# Patient Record
Sex: Male | Born: 1935 | Race: White | Hispanic: No | Marital: Married | Smoking: Former smoker
Health system: Southern US, Community
[De-identification: ages and names within clinical notes are randomized; demographics above are authoritative.]

## PROBLEM LIST (undated history)

## (undated) DIAGNOSIS — I701 Atherosclerosis of renal artery: Secondary | ICD-10-CM

## (undated) DIAGNOSIS — I714 Abdominal aortic aneurysm, without rupture, unspecified: Secondary | ICD-10-CM

## (undated) DIAGNOSIS — I499 Cardiac arrhythmia, unspecified: Secondary | ICD-10-CM

## (undated) DIAGNOSIS — M1990 Unspecified osteoarthritis, unspecified site: Secondary | ICD-10-CM

## (undated) DIAGNOSIS — J449 Chronic obstructive pulmonary disease, unspecified: Secondary | ICD-10-CM

## (undated) DIAGNOSIS — I10 Essential (primary) hypertension: Secondary | ICD-10-CM

## (undated) DIAGNOSIS — I2510 Atherosclerotic heart disease of native coronary artery without angina pectoris: Secondary | ICD-10-CM

## (undated) DIAGNOSIS — I639 Cerebral infarction, unspecified: Secondary | ICD-10-CM

## (undated) DIAGNOSIS — I4891 Unspecified atrial fibrillation: Secondary | ICD-10-CM

## (undated) DIAGNOSIS — E785 Hyperlipidemia, unspecified: Secondary | ICD-10-CM

## (undated) HISTORY — DX: Atherosclerosis of renal artery: I70.1

## (undated) HISTORY — DX: Cerebral infarction, unspecified: I63.9

## (undated) HISTORY — DX: Atherosclerotic heart disease of native coronary artery without angina pectoris: I25.10

## (undated) HISTORY — DX: Unspecified atrial fibrillation: I48.91

## (undated) HISTORY — PX: CATARACT EXTRACTION: SUR2

## (undated) HISTORY — DX: Abdominal aortic aneurysm, without rupture: I71.4

## (undated) HISTORY — PX: OTHER SURGICAL HISTORY: SHX169

## (undated) HISTORY — PX: MEDIAN STERNOTOMY: SUR860

## (undated) HISTORY — DX: Essential (primary) hypertension: I10

## (undated) HISTORY — DX: Chronic obstructive pulmonary disease, unspecified: J44.9

## (undated) HISTORY — DX: Cardiac arrhythmia, unspecified: I49.9

## (undated) HISTORY — PX: COLON SURGERY: SHX602

## (undated) HISTORY — PX: EYE SURGERY: SHX253

## (undated) HISTORY — DX: Abdominal aortic aneurysm, without rupture, unspecified: I71.40

## (undated) HISTORY — DX: Hyperlipidemia, unspecified: E78.5

---

## 1990-05-20 HISTORY — PX: SKIN GRAFT: SHX250

## 1995-05-21 HISTORY — PX: HAND SURGERY: SHX662

## 1996-05-20 HISTORY — PX: LAPAROTOMY: SHX154

## 2001-12-29 ENCOUNTER — Ambulatory Visit (HOSPITAL_COMMUNITY): Admission: RE | Admit: 2001-12-29 | Discharge: 2001-12-29 | Payer: Self-pay | Admitting: *Deleted

## 2005-02-12 ENCOUNTER — Encounter: Admission: RE | Admit: 2005-02-12 | Discharge: 2005-02-12 | Payer: Self-pay

## 2005-08-22 ENCOUNTER — Ambulatory Visit (HOSPITAL_COMMUNITY): Admission: RE | Admit: 2005-08-22 | Discharge: 2005-08-22 | Payer: Self-pay | Admitting: Vascular Surgery

## 2005-08-26 ENCOUNTER — Encounter: Payer: Self-pay | Admitting: Vascular Surgery

## 2005-08-26 ENCOUNTER — Ambulatory Visit (HOSPITAL_COMMUNITY): Admission: RE | Admit: 2005-08-26 | Discharge: 2005-08-26 | Payer: Self-pay | Admitting: Cardiovascular Disease

## 2005-08-26 HISTORY — PX: CARDIAC CATHETERIZATION: SHX172

## 2005-08-28 ENCOUNTER — Encounter: Admission: RE | Admit: 2005-08-28 | Discharge: 2005-08-28 | Payer: Self-pay | Admitting: Vascular Surgery

## 2005-09-02 ENCOUNTER — Inpatient Hospital Stay (HOSPITAL_COMMUNITY): Admission: RE | Admit: 2005-09-02 | Discharge: 2005-09-07 | Payer: Self-pay | Admitting: Surgery

## 2005-09-02 HISTORY — PX: CORONARY ARTERY BYPASS GRAFT: SHX141

## 2005-09-16 ENCOUNTER — Encounter: Admission: RE | Admit: 2005-09-16 | Discharge: 2005-10-01 | Payer: Self-pay | Admitting: Surgery

## 2005-09-26 ENCOUNTER — Encounter (HOSPITAL_COMMUNITY): Admission: RE | Admit: 2005-09-26 | Discharge: 2005-12-25 | Payer: Self-pay | Admitting: Cardiovascular Disease

## 2005-12-23 ENCOUNTER — Inpatient Hospital Stay (HOSPITAL_COMMUNITY): Admission: RE | Admit: 2005-12-23 | Discharge: 2005-12-25 | Payer: Self-pay | Admitting: Vascular Surgery

## 2005-12-23 HISTORY — PX: PR VEIN BYPASS GRAFT,AORTO-FEM-POP: 35551

## 2006-01-06 ENCOUNTER — Encounter: Admission: RE | Admit: 2006-01-06 | Discharge: 2006-01-16 | Payer: Self-pay | Admitting: Surgery

## 2006-01-21 ENCOUNTER — Encounter: Admission: RE | Admit: 2006-01-21 | Discharge: 2006-01-21 | Payer: Self-pay | Admitting: Vascular Surgery

## 2006-07-15 ENCOUNTER — Encounter: Admission: RE | Admit: 2006-07-15 | Discharge: 2006-07-15 | Payer: Self-pay | Admitting: Vascular Surgery

## 2006-07-15 ENCOUNTER — Ambulatory Visit: Payer: Self-pay | Admitting: Vascular Surgery

## 2007-01-06 ENCOUNTER — Ambulatory Visit: Payer: Self-pay | Admitting: Vascular Surgery

## 2007-01-06 ENCOUNTER — Encounter: Admission: RE | Admit: 2007-01-06 | Discharge: 2007-01-06 | Payer: Self-pay | Admitting: Vascular Surgery

## 2007-12-28 ENCOUNTER — Ambulatory Visit: Payer: Self-pay | Admitting: Vascular Surgery

## 2007-12-28 ENCOUNTER — Encounter: Admission: RE | Admit: 2007-12-28 | Discharge: 2007-12-28 | Payer: Self-pay | Admitting: Vascular Surgery

## 2009-01-10 ENCOUNTER — Encounter: Admission: RE | Admit: 2009-01-10 | Discharge: 2009-01-10 | Payer: Self-pay | Admitting: Vascular Surgery

## 2009-01-10 ENCOUNTER — Ambulatory Visit: Payer: Self-pay | Admitting: Vascular Surgery

## 2010-02-06 ENCOUNTER — Ambulatory Visit: Payer: Self-pay | Admitting: Vascular Surgery

## 2010-02-28 ENCOUNTER — Ambulatory Visit: Payer: Self-pay | Admitting: Cardiovascular Disease

## 2010-10-02 NOTE — Assessment & Plan Note (Signed)
OFFICE VISIT   Gabriel Gutierrez, Gabriel Gutierrez  DOB:  09-24-1935                                       01/10/2009  CHART#:[id number]   Mr. Armato returns today for continued followup regarding his aortic  stent graft which was inserted for an infrarenal abdominal aortic  aneurysm in August 2007.  It is a [employer] aortic stent graft.  He  has had no abdominal or back symptoms since I last saw him 1 year ago.  He denies any chest pain, dyspnea on exertion, PND, orthopnea, or  claudication symptoms.  He is taking 1 aspirin per day.   On physical exam, blood pressure 144/73, heart rate 56, respirations 14.  Carotid pulses 3+, no audible bruits.  NEUROLOGIC:  Normal.  CHEST:  Clear to auscultation.  ABDOMEN:  Soft, nontender with no palpable  masses.  He has 3+ femoral popliteal and posterior tibial pulses  palpable bilaterally.   CT angiogram was performed today which I reviewed and reveals the stent  graft to be in excellent position with no evidence of endoleak or  migration of the graft.  The aneurysm sac measures only 2.6 x 3.2 cm  around the graft.   I am very pleased with his result.  We will see him in 1 year.  We will  obtain a duplex scan in the office for continued followup of his stent  graft.   Quita Skye Hart Rochester, M.D.  Electronically Signed   JDL/MEDQ  D:  01/10/2009  T:  01/11/2009  Job:  [id number]

## 2010-10-02 NOTE — Procedures (Signed)
DUPLEX ULTRASOUND OF ABDOMINAL AORTA   INDICATION:  Follow up endostent repair of AAA.   HISTORY:  Diabetes:  Yes.  Cardiac:  No.  Hypertension:  No.  Smoking:  Previous.  Connective Tissue Disorder:  Family History:  No.  Previous Surgery:  Stent graft repair of abdominal aortic aneurysm and  right common iliac artery PTA in 2007.   DUPLEX EXAM:         AP (cm)                   TRANSVERSE (cm)  Proximal             2.4 cm                    2.8 cm  Mid                  Not visualized            Not visualized  Distal               2.9 cm                    3.1 cm  Right Iliac          Not visualized            Not visualized  Left Iliac           Not visualized            Not visualized   PREVIOUS:  Date: 01/10/2009 (CT)  AP:  2.6  TRANSVERSE:  3.2   IMPRESSION:  1. Patent abdominal aortic aneurysm stent with no evidence of endoleak      or stenosis, based on limited visualization.  2. No significant change in the abdominal aortic aneurysm sac size      when compared to the previous examination.  3. Unable to adequately visualize the mid aorta and bilateral common      iliac arteries due to overlying bowel gas patterns.  The patient      was not n.p.o. for this examination.        ___________________________________________  Quita Skye Hart Rochester, M.D.   CH/MEDQ  D:  02/06/2010  T:  02/06/2010  Job:  [id number]

## 2010-10-02 NOTE — Assessment & Plan Note (Signed)
OFFICE VISIT   Gabriel Gutierrez, Gabriel Gutierrez  DOB:  1935-11-21                                       02/06/2010  CHART#:[id number]   The patient returns today for his annual followup regarding his  infrarenal abdominal aortic aneurysm which was treated with a Cook  Zenith stent graft in 2007.  He has had no abdominal or back symptoms  since I last saw him 1 year ago.  He also has had no chest pain, dyspnea  on exertion, PND, orthopnea, claudication, GI or GU-type symptoms.  He  remains quite active, easily walking a mile at a time.   CHRONIC MEDICAL PROBLEMS:  1. Type 2 diabetes mellitus.  2. Hyperlipidemia.  3. Coronary artery disease, previous coronary bypass grafting in 2007.  4. History of colon rupture.  5. Negative for stroke, COPD or hypertension.   SOCIAL HISTORY:  He is retired and has 3 children.  Does not use tobacco  or alcohol.   REVIEW OF SYSTEMS:  Negative for chest pain, dyspnea on exertion.  Has  occasional wheezing, arthritis and decreased hearing and urinary  frequency.  All other systems negative in review of systems.   PHYSICAL EXAMINATION:  Blood pressure 166/68, heart rate 64, temperature  98, respirations 14.  General:  A well-developed, well-nourished male in  no apparent distress.  HEENT:  Normal for age.  Lungs:  Clear to  auscultation.  No rhonchi or wheezing.  Cardiovascular:  Regular rhythm,  no murmurs.  Abdomen:  Soft, nontender, no pulsatile mass noted.  He has  3+ femoral, popliteal and posterior tibial pulses palpable bilaterally  with no distal edema.   Today I ordered a duplex scan to visualize his aorta and the graft.  There is no evidence of any endoleak.  The aneurysm sac continues to  contract, having a maximum diameter of 3.1 cm.   I reassured him regarding these findings.  He will return in 1 year with  a duplex scan of his aorta to again check the stent graft for problems.     Quita Skye Hart Rochester, M.D.  Electronically  Signed   JDL/MEDQ  D:  02/06/2010  T:  02/07/2010  Job:  [id number]

## 2010-10-02 NOTE — Assessment & Plan Note (Signed)
OFFICE VISIT   Gabriel Gutierrez, Gabriel Gutierrez  DOB:  09-24-1935                                       12/28/2007  CHART#:[id number]   The patient returns for his annual follow-up regarding his aortic  aneurysm repair with a stent graft (Zenith) to both common iliac  arteries which was done in August 2007.  He has had no abdominal  symptoms.  He does have chronic back symptoms on occasion but nothing  out of the ordinary.  He has also had no chest pain, dyspnea on  exertion, PND, orthopnea hemiparesis, aphasia, amaurosis fugax,  diplopia, blurred vision, syncope or claudication type symptoms.  He  takes aspirin 81 mg once daily.   PHYSICAL EXAM:  Blood pressure 152/70, heart rate is 55, respirations  are 12.  Carotid pulses 3+, no audible bruits.  Neurologic:  Normal.  No  palpable adenopathy in the neck.  Chest:  Clear to auscultation.  Cardiovascular:  Regular rhythm no murmurs.  Abdomen:  Soft, nontender  with no palpable masses.  He has 3+ femoral, 2+ popliteal and 2+  posterior tibial pulses palpable bilaterally.  Both feet are well-  perfused.   I reviewed the CT angiogram today and there is no evidence of any  endoleak, migration of the graft, fracture of the graft or other  abnormalities and it remains in excellent position.  Aneurysm has  contracted slightly around graft today.   I have reassured him regarding these findings, I am seeing him back in 1  year for continued follow-up CT angiogram on an annual basis.   Quita Skye Hart Rochester, M.D.  Electronically Signed   JDL/MEDQ  D:  12/28/2007  T:  12/29/2007  Job:  [id number]   cc:   Brent Bulla, MD

## 2010-10-02 NOTE — Assessment & Plan Note (Signed)
OFFICE VISIT   KAIDON, KINKER  DOB:  [birth year]                                       01/06/2007  CHART#:[id number]   The patient returns today one year post repair of an abdominal aortic  aneurysm using an aortic stent graft (Zenith) to both common iliac  arteries. He has done quite well with no complications or complaints. He  has returned to his normal activity level. A CT angiogram was performed  today, which I have reviewed and reveals no evidence of any migration of  the graft, endoleak or fracture of the stents. He denies any chest pain,  dyspnea on exertion, PND or orthopnea and is able to ambulate long  distances doing a fast walk each morning for between one-half and one  mile without calf symptoms. He takes one aspirin per day.   PHYSICAL EXAMINATION:  Blood pressure is 118/48, heart rate 58 and  regular, respirations are 16. His carotid pulses are 3+ and no audible  bruits. NEUROLOGIC: Examination is normal. CHEST: Clear to auscultation.  ABDOMEN: Soft and nontender with no pulsatile mass noted. He has 3+  femoral and 2+ posterior tibial pulses palpable bilaterally.   I think he is getting along quite well and will continue to follow him  on a regular basis for any problems that could arise from the stent  graft with a CT angiogram to be performed in 12 months prior to his  return.   Quita Skye Hart Rochester, M.D.  Electronically Signed   JDL/MEDQ  D:  01/06/2007  T:  01/07/2007  Job:  [id number]   cc:   Soyla Murphy. Renne Crigler, M.D.

## 2010-10-05 NOTE — Op Note (Signed)
NAME:  Gabriel Gutierrez, Gabriel Gutierrez                 ACCOUNT NO.:  [id number]   MEDICAL RECORD NO.:  [id number]          PATIENT TYPE:  AMB   LOCATION:  SDS                          FACILITY:  [facility]   PHYSICIAN:  Quita Skye. Hart [place], M.D.  DATE OF BIRTH:  May 22, 1935   DATE OF PROCEDURE:  08/22/2005  DATE OF DISCHARGE:  08/22/2005                                 OPERATIVE REPORT   PREOP DIAGNOSIS:  Infrarenal abdominal aortic aneurysm.   POSTOP DIAGNOSIS:  Infrarenal abdominal aortic aneurysm.   PROCEDURE:  1.  Biplane abdominal aortogram with bilateral lower extremity runoff.  2.  Bilateral iliac angiography.   SURGEON:  Dr. Hart Rochester.   ANESTHESIA:  Local Xylocaine.   CONTRAST:  180 mL.   COMPLICATIONS:  None.   DESCRIPTION OF PROCEDURE:  Patient was taken to [facility] peripheral  endovascular lab, placed in the supine position at which time the right  groin was prepped with Betadine solution and draped in routine sterile  manner.  After infiltration of 1% Xylocaine, right common femoral artery was  entered percutaneously.  Guidewire passed into the suprarenal aorta under  fluoroscopic guidance.  A 5-French sheath and dilator were passed over the  guide wire, dilator removed and a standard graduated pigtail catheter  positioned in the suprarenal aorta.  Flush abdominal aortogram was performed  injecting 30 mL of contrast at 20 mL per second.  This revealed the aorta to  be widely patent with single renal arteries bilaterally with the left  slightly lower than the right.  There was an infrarenal saccular abdominal  aortic aneurysm measuring approximately 5 cm in diameter by the calcium rim  measurements and it terminated at the aortic bifurcation.  Inferior  mesenteric artery was patent.  There was a mild stenosis at the origin of  both common iliac arteries, but the common iliac arteries otherwise were  widely patent and the internal and external iliac arteries were patent as  well.  Lateral  aortogram was performed injecting 20 mL of contrast at 20 mL  per second.  This revealed the superior mesenteric artery to be widely  patent and the aneurysm neck to be very slightly deviated anteriorly.  The  catheter was withdrawn into the terminal aorta, bilateral oblique iliac  angiography views were obtained in both RAO and LAO positions revealing the  same findings in the iliac system with widely patent internal iliac  arteries.  The bilateral lower extremity runoff was then performed injecting  88 mL of contrast at 8 mL per second.  This revealed both common,  superficial, profunda femoris and popliteal arteries to be widely patent.  There was three-vessel runoff in both legs with no significant occlusive  lesions noted.  The pigtail catheter was then removed over the guidewire,  the sheath removed, adequate compression applied.  No complications ensued.   FINDINGS:  1.  Infrarenal abdominal aortic aneurysm.  2.  Single widely patent renal arteries bilaterally.  3.  Mild proximal iliac occlusive disease bilaterally with otherwise widely      patent iliac system and normal distal runoff.  ______________________________  Quita Skye Hart Rochester, M.D.     JDL/MEDQ  D:  08/22/2005  T:  08/23/2005  Job:  [id number]

## 2010-10-05 NOTE — Op Note (Signed)
   NAME:  Gabriel Gutierrez, Gabriel Gutierrez                         ACCOUNT NO.:  [id number]   MEDICAL RECORD NO.:  [id number]                   PATIENT TYPE:  AMB   LOCATION:  ENDO                                 FACILITY:  [facility]   PHYSICIAN:  Georgiana Spinner, M.D.                 DATE OF BIRTH:  [birth year]   DATE OF PROCEDURE:  12/29/2001  DATE OF DISCHARGE:                                 OPERATIVE REPORT   PROCEDURE:  Colonoscopy.   INDICATIONS:  Colon cancer screening. Hemoccult positivity.   ANESTHESIA:  Demerol 50, Versed 7.5 mg.   DESCRIPTION OF PROCEDURE:  With patient mildly sedated in the left lateral  decubitus position, the Olympus videoscopic colonoscope was inserted in the  rectum after a normal rectal exam and passed under direct vision to the  cecum, identified by the ileocecal valve and appendiceal orifice, both of  which were photographed.  Of note, as we entered, it almost appeared as if  he had a blind pouch, and this may have been where the anastomosis occurred.  But from this point, the colonoscope was then slowly withdrawn, taking  circumferential views of the entire colonic mucosa, stopping in the rectum  only which appeared normal on direct and showed small hemorrhoids on  retroflexed view.  The endoscope was straightened and withdrawn.  The  patient's vital signs and pulse oximeter remained stable.  The patient  tolerated the procedure well without apparent complications.   FINDINGS:  Status post colonic resection with internal hemorrhoids.  Otherwise unremarkable colonoscopic examination.   PLAN:  Repeat examination possibly in five years.                                               Georgiana Spinner, M.D.    GMO/MEDQ  D:  12/29/2001  T:  12/30/2001  Job:  [id number]

## 2010-10-05 NOTE — Op Note (Signed)
Gabriel Gutierrez, Gabriel Gutierrez                 ACCOUNT NO.:  [id number]   MEDICAL RECORD NO.:  [id number]          PATIENT TYPE:  INP   LOCATION:  2550                         FACILITY:  [facility]   PHYSICIAN:  Quita Skye. Hart Rochester, M.D.  DATE OF BIRTH:  [birth year]   DATE OF PROCEDURE:  12/23/2005  DATE OF DISCHARGE:  12/11/2005                                 OPERATIVE REPORT   PREOPERATIVE DIAGNOSIS:  Infrarenal abdominal aortic aneurysm.   POSTOPERATIVE DIAGNOSIS:  Infrarenal abdominal aortic aneurysm, right common  iliac occlusive disease.   OPERATION:  1.  Bilateral common femoral artery cut down, right side by Dr. Hart Rochester, left      side by Dr. Darrick Penna.  He will dictate that portion.  2.  Insertion of a Cook-Zenith aortic stent graft using:      1.  A 24 x 82 main body.      2.  A 14 x 88 left leg, contralateral limb.      3.  A 14 x 71 right leg, ipsilateral limb with completion angiogram.  3.  PTA of right common iliac artery stenosis using an 8 x 3 Power Flex      angioplasty catheter inflated at 10 atmospheres for 45 seconds.   SURGEON:  Devean Skoczylas. Hart Rochester, M.D.  Janetta Hora. Darrick Penna, M.D.   ANESTHESIA:  General endotracheal.   COMPLICATIONS:  None.   DESCRIPTION OF PROCEDURE:  The patient was taken to the operating room and  placed in supine position at which time satisfactory general endotracheal  anesthesia was administered.  Abdomen and both groins were prepped with  Betadine scrub and solution and draped in routine sterile manner.  Oblique  inguinal incisions were made bilaterally and carried down through  subcutaneous tissues and common superficial and profundus femoral arteries  dissected free, encircled with vessel loops.  Then, 5000 units of heparin  were given intravenously.  Both common femoral arteries were punctured,  guide wires passed into the suprarenal aorta under fluoroscopic guidance and  the sheaths were inserted over the guide wires.  Dilators removed and a  standard  pigtail catheter passed into the suprarenal aorta.  Flush abdominal  aortogram was preformed, injecting 20 cc of contrast at 20 cc per second.  This revealed the aorta to have single renal arteries bilaterally with an  adequate 2.5 to 3-cm neck below the renal arteries, proximal to the saccular  aneurysm.  There was some occlusive disease in the right common iliac artery  which made initial passage of the guide wire somewhat difficult, although it  was performed with a glide wire with no significant difficulty.  The heparin  having been administered, the main body was deployed through the right  common femoral artery with a 24-mm x 82-mm Cook Zenith main body graft  having been inserted.  This was deployed just distal to the renal arteries  with a suprarenal stent.  It was deployed down to the bifurcation.  At this  point, the contralateral gate was cannulated which also required some  manipulation with a variety of catheters, eventually being successful with  a  SOS catheter.  After it was cannulated, the left-contralateral limb was  deployed utilizing a 14-mm x 88-mm limb.  Prior to deployment of this limb,  a left leg angiogram was performed through the sheath to localize the exact  location of the internal iliac artery to be certain that the limb was  deployed proximal to this point.  Following this, the remainder of the mean  body was deployed and the right limb was deployed in a similar fashion  utilizing 14-mm x 71-mm graft, again performing an angiogram through the  sheath to localize the hypogastric prior to deployment.  Following  completion of deployment of both limbs, a soft dilatation catheter was used  to tack down the limbs at all junction spots.  Then, a completion angiogram  was performed.  There was no evidence of any endo leaks and the graft was in  excellent position.  There was some moderate narrowing which was persistent  in the right common iliac artery, necessitating  balloon angioplasty of this  artery using an 8-mm x 3-cm Power Flex catheter inflated at 10 atmospheres  for 45 seconds.  Cmopletion angiogram after this revealed excellent  technical result.  The heparin was not reversed.  Adequate hemostasis was  achieved after removing the sheaths and both common femoral arteries were  repaired with continuous 6-0 Prolene sutures with excellent posterior tibial  pulses following this.  Following adequate hemostasis, the wounds were  closed in layers with 5-0 chromic in subcuticular fashion and Steri-Strips.  Sterile dressing applied.  The patient was taken to the recovery room in  satisfactory condition.           ______________________________  Quita Skye Hart Rochester, M.D.     JDL/MEDQ  D:  12/23/2005  T:  12/23/2005  Job:  [id number]

## 2010-10-05 NOTE — Discharge Summary (Addendum)
NAMEFRANCISCO, OSTROVSKY NO.:  [id number]   MEDICAL RECORD NO.:  [id number]          PATIENT TYPE:  INP   LOCATION:  2005                         FACILITY:  [facility]   PHYSICIAN:  Quita Skye. Hart Rochester, M.D.  DATE OF BIRTH:  Jul 06, 1935   DATE OF ADMISSION:  DATE OF DISCHARGE:                                 DISCHARGE SUMMARY   ADMISSION DIAGNOSIS:  Abdominal aortic aneurysm.   DISCHARGE DIAGNOSES:  1.  Abdominal aortic aneurysm status post stent graft.  2.  Coronary artery disease status post coronary artery bypass grafting.  3.  Hypertension.  4.  Hyperlipidemia.  5.  History of colostomy for perforated diverticulitis with colon resection      in 1993.  6.  Small bowel obstruction secondary to adhesions in August 1998.   CONSULTS:  None.   PROCEDURES:  On December 24, 2005 the patient underwent:  1.  Bilateral common femoral artery cut down, right side by Dr. Durward Fortes and      left side by Dr. Darrick Penna.  2.  Insertion of a Zenith aortic stent graft using a 24 x 82 main body, a 14      x 44 left leg, contralateral limb, a 14 x 71 right ipsilateral limb with      completion angiogram, PTA of the right common iliac artery stenosis      using an 8 x 3 power flexing ____ QA MARKER: 140 ____ cathe      inflated to 10 atmospheres for 45 seconds by Dr. Josephina Gip.   HISTORY AND PHYSICAL:  The 74 year old Caucasian male with a past medical  history of coronary artery disease status post CABG, and abdominal aortic  aneurysm.  Patient was being evaluated for an aortic stent graft, at which  time he was found to have severe coronary artery disease.  Patient underwent  CABG by per Dr. ____ QA MARKER: 186 ____  2007  well postoperatively.  The patient now presents for repair of his abdominal  aortic aneurysm.  The patient has a CT scan with an aortogram, which showed  that he was an excellent candidate for an aortic stent graft.  Patient  remains asymptomatic.  He denies any  abdominal pain, nausea, vomiting, back  pain, hematuria, reflux symptoms, chest pain, shortness of breath, TIA or  CVA symptoms.   HOSPITAL COURSE:  Postoperatively, the patient has progressed as expected.  Patient's vital signs have remained stable.  Patient was started on a clear  liquid diet on December 23, 2005.  The patient has progressed to a solid heart  healthy diet on postop day number one, December 24, 2005.  The patient has  started his p.o. medication on postop day number 1.   The patient has remained hemodynamically stable.  His IV fluids and Foley  will be discontinued on postop day number 1.  The patient is to ambulate,  and he is to use his incentive spirometry.   PHYSICAL EXAM:  VITAL SIGNS:  Temperature 98.6, blood pressure 118/40, heart  rate 72, respirations 20, input and output plus 20.  CARDIOVASCULAR:  Regular rate and rhythm.  RESPIRATORY:  Clear to auscultation bilaterally.  ABDOMEN:  Bowel sounds positive, soft, nontender, nondistended.  EXTREMITIES:  Warm and well perfused.  INCISION:  Clean, dry and intact.   LABS:  White blood cell count of 9.2, hemoglobin 11.3, hematocrit of 33.8  and a platelet count of 189,000.  BMP showed a sodium of 135, potassium 3.8,  chloride 104, CO2 25, BUN 7, creatinine 1.09, glucose 144.   Patient will be discharged home tomorrow provided he remains in stable  condition.   MEDICATIONS:  1.  Tylox 1-2 tabs every 4-6 hours p.r.n.  2.  Aspirin 325 mg p.o. daily.  3.  Metoprolol 25 mg p.o. daily.  4.  Iron over the counter p.o. daily.  5.  Vytorin 10/40 mg p.o. daily.   INSTRUCTIONS:  Patient instructed to follow a low-fat, low-salt, low-sugar.  Patient to do no driving or heavy lifting greater than 10 pounds for 3  weeks.  The patient is to ambulate 3-4 daily, and increase activity as  tolerated.  The patient may shower and clean his incision with mild soap and  water.  The patient is instructed to call the office with any  incision  problems such as drainage, erythema, temp greater than 101.5.   FOLLOWUP:  Patient will have a follow-up appointment with Dr. Hart Rochester in  January 21, 2006 at 10:30 a.m.  The patient will have a postop stent graft  protocol CT angiogram before his appointment with Dr. Hart Rochester, the office  will send him the information on the time and date of the appointment.      Constance Holster, PA    ______________________________  Quita Skye Hart Rochester, M.D.    JMW/MEDQ  D:  12/24/2005  T:  12/24/2005  Job:  [id number]   cc:   Quita Skye. Hart Rochester, M.D.

## 2010-10-05 NOTE — Op Note (Signed)
NAMELINKYN, GOBIN NO.:  [id number]   MEDICAL RECORD NO.:  [id number]          PATIENT TYPE:  INP   LOCATION:  2003                         FACILITY:  [facility]   PHYSICIAN:  Evelene Croon, M.D.     DATE OF BIRTH:  06-23-1935   DATE OF PROCEDURE:  09/02/2005  DATE OF DISCHARGE:                                 OPERATIVE REPORT   PREOPERATIVE AND POSTOPERATIVE DIAGNOSIS:  Severe three-vessel coronary  disease.   OPERATIVE PROCEDURE:  Median sternotomy, extracorporeal circulation,  coronary bypass graft surgery x5 using a left internal mammary artery graft  to the left anterior descending coronary artery, with a saphenous vein graft  to the diagonal branch of the LAD, a sequential saphenous vein graft to the  second and fourth obtuse marginal branches of the left circumflex coronary  artery, and a saphenous vein graft to the posterior descending branch of the  right coronary.  Endoscopic vein harvesting from the left leg.   ATTENDING SURGEON:  Evelene Croon, M.D.   ASSISTANT:  Constance Holster, PA-C   ANESTHESIA:  General endotracheal.   CLINICAL HISTORY:  This patient is a 75 year old patient followed by Dr.  Merri Brunette who was found to have a 4.85 cm infrarenal abdominal aortic  aneurysm by ultrasound.  He is being considered for aortic endovascular  stent grafting.  Preoperative cardiac workup included a stress Cardiolite  that showed inferior lateral ischemia.  Cardiac catheterization on August 26, 2005 shows severe 3-vessel disease.  The coronaries were heavily calcified.  The left main had about 50% ostial stenosis and was heavily calcified.  The  LAD had proximal 50-60% stenosis followed by a mid vessel 80% stenosis.  This 80% stenosis originated at the takeoff of the second diagonal branch  and was very hazy.  The second diagonal itself had about 70-80% stenosis.  The left circumflex was occluded proximally with the second and third  marginal vessels  filling by collaterals from the LAD.  The right coronary  artery had 60% proximal stenosis followed by 60-70% mid vessel stenosis.  There was also about 99% distal stenosis beyond the bifurcation.  Left  ventricular ejection fraction was about 65-70%.  There was no mitral  regurgitation.  After review of the angiogram and examination of the  patient; it was felt that coronary artery bypass graft surgery was the best  treatment to prevent further ischemia and infarction.  I discussed the  operative procedure with the patient and his wife including alternatives,  benefits, and risks including bleeding, blood transfusion, infection,  stroke, myocardial infarction, graft failure, and death.  He understood and  agreed to proceed.   OPERATIVE PROCEDURE:  The patient was taken to the operating room and placed  on the table in the supine position.  After the induction of general  endotracheal anesthesia a Foley catheter was placed in the bladder using a  sterile technique.  Then the chest, abdomen, and both lower extremities were  prepped and draped in the usual sterile manner.  The chest was entered  through a median sternotomy incision,  and the pericardium opened in the  midline.  Examination of the heart showed good ventricular contractility.  The ascending aorta was somewhat thickened, but had no palpable plaque in  it.   Then the left internal mammary artery was harvested from the chest wall as a  pedicle graft.  This was a medium caliber vessel with excellent blood flow  through it.  At the same time a segment of greater saphenous vein was  harvested from the left leg using endoscopic vein harvest technique.  This  vein was a medium size and good quality.  We initially examined the  saphenous vein adjacent to the right knee, but this vein was small and not  felt to be suitable for bypass grafting.  It was not harvested.   Then the patient was heparinized; and when an adequate clotting  time was  achieved, the distal ascending aorta was cannulated using a 20-French aortic  cannula for arterial inflow.  Venous outflow was achieved using a 2-stage  venous cannula through the right atrial appendage.  An antegrade  cardioplegia and vent cannula was inserted int the aortic root.   The patient was placed on cardiopulmonary bypass and the distal coronary was  identified.  The LAD was a large graftable vessel.  The diagonal branch was  a medium size graftable vessel that was heavily diseased proximally.  The  remainder of the diagonal branches were small vessels.  The left circumflex  gave off a small first marginal that was lying deep in the fat.  It was not  felt to be graftable.  The second marginal was also very small but felt to  be graftable.  The third marginal was tiny and the fourth marginal was a  moderate size vessel that was graftable but diffusely diseased.  The right  coronary artery gave off a moderate size posterior descending branch and a  tiny posterolateral branch.   Then aorta was cross clamped and 500 mL of cold blood antegrade cardioplegia  was administered in the aortic root with quick arrest of the heart.  Systemic hypothermia to 28 degrees Centigrade and topical hypothermic iced  saline was used.  A temperature probe was placed in septum and an insulating  pad in the pericardium.   The first distal anastomosis was formed in the posterior descending coronary  artery.  The internal diameter of this vessel was about 1.75 mm.  The  conduit used was a segment of greater saphenous vein and the anastomosis  performed in an end-to-side manner using continuous 7-0 Prolene suture.  Flow was noted through the graft and was excellent.   The second distal anastomosis was performed to the second marginal branch.  The internal diameter was about 1.25 mm.  The conduit used was a second segment of greater saphenous vein.  The anastomosis performed in a  sequential  side-to-side manner using continuous 8-0 Prolene suture.  Flow  was admitted through this graft and was fair.   The third distal anastomosis was performed to the fourth marginal branch.  The internal diameter of this vessel was about 1.6 mm.  The conduit used was  the same segment of greater saphenous vein.  The anastomosis was performed  in a sequential end-to-side manner using continuous 7-0 Prolene suture.  Flow was admitted through the graft and was excellent.  Then another dose of  cardioplegia was given down the vein grafts and into the aortic root.   The fourth distal anastomosis was performed to the  diagonal branch.  The  internal diameter of this vessel was about 1.6 mm.  The conduit used was a  segment of greater saphenous vein.  The anastomosis was performed in an end-  to-side manner using continuous 7-0 Prolene suture.  Flow was noted through  the graft and was excellent.   The fifth distal anastomosis was performed to the distal portion of the left  anterior descending coronary.  The internal diameter was about 1.75 mm.  The  conduit used was a left internal mammary graft; and this was brought through  an opening in the left pericardium anterior to the phrenic nerve.  It was  anastomosed to the LAD in an end-to-side manner using continuous 8-0 Prolene  suture.  The pedicle was sutured to the epicardium with 6-0 Prolene sutures.  The patient was then rewarmed to 37 degrees centigrade.  With the crossclamp  in place, the 3 proximal vein graft anastomoses were performed in the aortic  root in an end-to-side manner using continuous 6-0 Prolene suture.  The  clamp was removed from the mammary pedicle.  There was rapid warming of the  ventricular septum and return of spontaneous ventricular fibrillation.  The  crossclamp was removed at the time of 92 minutes; and the patient  spontaneously converted to sinus rhythm.   The proximal and distal anastomoses appeared hemostatic and  __________ the  graft satisfactory.  The graft markers were placed around the proximal  anastomoses.  Two temporary right ventricular and right atrial pacing wires  were placed and brought out through the skin.   When the patient had rewarmed to 37 degrees centigrade, he was weaned from  cardiopulmonary bypass on no __________.  Total bypass time was 107 minutes.  Cardiac function appeared excellent with the cardiac output of 9 L/min.  Protamine was given and the venous and aortic cannulas were removed without  difficulty.  Hemostasis was achieved.  Three chest tubes were placed with 2  in the posterior pericardium, one in the left pleural space and 1 in the  anterior mediastinum.  The pericardium was reapproximated over the heart.  The sternum was closed with #6 stainless steel wires.  The fascia was closed  with continuous #1 Vicryl suture.  Subcutaneous tissue was closed with continuous 2-0 Vicryl and the skin with a 3-0 Vicryl subcuticular closure.  The lower extremity vein harvest site was closed in layers in a similar  manner.  The sponge, needle, and instrument counts were correct according to  the scrub nurse.  Dry sterile dressings were applied over the incisions and  around the chest tubes which were hooked to Pleur-Evac suction.  The patient  remained hemodynamically stable, and was transported to the SICU in guarded,  but stable condition.      Evelene Croon, M.D.  Electronically Signed     BB/MEDQ  D:  09/02/2005  T:  09/03/2005  Job:  [id number]   cc:   [facility] Office   Vesta Mixer, M.D.  Fax: [phone]   Cardiac Cath Lab

## 2010-10-05 NOTE — Op Note (Signed)
NAMEMARQUIE, [name] NO.:  [id number]   MEDICAL RECORD NO.:  [id number]          PATIENT TYPE:  INP   LOCATION:  2005                         FACILITY:  [facility]   PHYSICIAN:  Janetta Hora. Fields, MD  DATE OF BIRTH:  Oct 01, 1935   DATE OF PROCEDURE:  12/23/2005  DATE OF DISCHARGE:  12/25/2005                                 OPERATIVE REPORT   PROCEDURE:  Exposure and repair of left common femoral artery for Zenith  stent graft placement.   PREOPERATIVE DIAGNOSIS:  Abdominal aortic aneurysm.   POSTOPERATIVE DIAGNOSIS:  Abdominal aortic aneurysm.   ANESTHESIA:  General.   ASSISTANT:  Josephina Gip, M.D.   OPERATIVE FINDINGS:  Left common femoral artery with a large amount of  posterior plaque.   OPERATIVE DETAIL:  After obtaining informed consent, the patient was taken  to the operating.  The patient was placed in supine position on the  operating table.  After induction of general anesthesia and endotracheal  intubation, a Foley catheter was placed.  Next, the patient's abdomen and  upper legs were prepped and draped in the usual sterile fashion.  An oblique  incision was made over the left common femoral artery and carried down  through subcutaneous tissues, down to level of the common femoral artery.  Common femoral artery was dissected free circumferentially.  There was a  large amount of plaque posteriorly.  The artery was controlled proximally  distally with vessel loops.  Next, a Majestic needle was used to cannulate  the left common femoral artery, and a guidewire advanced into the abdominal  aorta.  The Zenith stent graft was then placed, as dictated by Dr. Hart Rochester.  At the completion of the stent graft procedure, the wires and catheters were  removed.  The common femoral artery was controlled proximally with a Henley  clamp and distally with vessel loop.  The transverse arteriotomy was  repaired using a running 6-0 Prolene suture.  Just prior to  completion  anastomosis it was fore-bled, back-bled and thoroughly flushed.  Anastomosis  was secured, clamps were released, there was a good palpable pulse distal in  the femoral artery immediately.  Deep layers were closed with running 2-0  Vicryl suture.  Subcutaneous tissues were closed with running 3-0 Vicryl  suture.  The skin was closed with a 4-0 Vicryl subcuticular stitch.  The  patient tolerated the procedure well and there were no complications.  Instrument, sponge and needle counts were correct at the end of the case.  The patient was taken to the recovery room in stable condition.      Janetta Hora. Fields, MD  Electronically Signed     CEF/MEDQ  D:  12/26/2005  T:  12/26/2005  Job:  [id number]

## 2010-10-05 NOTE — Discharge Summary (Signed)
Gabriel Gutierrez, Gabriel Gutierrez                 ACCOUNT NO.:  [id number]   MEDICAL RECORD NO.:  [id number]          PATIENT TYPE:  INP   LOCATION:  2003                         FACILITY:  [facility]   PHYSICIAN:  Gabriel Gutierrez, M.D.     DATE OF BIRTH:  1935/12/03   DATE OF ADMISSION:  09/02/2005  DATE OF DISCHARGE:  09/07/2005                                 DISCHARGE SUMMARY   PRIMARY ADMITTING DIAGNOSIS:  Coronary artery disease.   ADDITIONAL/DISCHARGE DIAGNOSES:  1.  Severe three-vessel coronary artery disease.  2.  Known history of 4.8 cm abdominal aortic aneurysm, awaiting endovascular      stent grafting.  3.  Postoperative atrial fibrillation.  4.  Postoperative anemia.  5.  Newly-diagnosed diabetes mellitus.  6.  Hypertension.  7.  Hyperlipidemia.  8.  History of colostomy for perforated diverticula with subsequent      takedown.  9.  History of tobacco abuse.   PROCEDURES PERFORMED:  1.  Coronary artery bypass grafting x5 (left internal mammary artery to the      LAD, sepsis vein graft to the diagonal, sequential saphenous vein graft      to the second and fourth obtuse marginal branches of the left circumflex      coronary, saphenous vein graft to the posterior descending).  2.  Endoscopic vein harvest, left leg.   HISTORY:  The patient is a 75 year old male who was recently referred to Dr.  Jerilee Field for evaluation of a 4.85 cm infrarenal abdominal aortic  aneurysm. He was being evaluated for possible endovascular stent graft  repair and subsequently underwent a preoperative cardiac workup which  included a stress Cardiolite. This showed inferolateral ischemia, and  because of this he was referred for cardiac catheterization. He saw Dr.  Lourena Simmonds and underwent outpatient catheterization on August 26, 2005 which  showed severe three-vessel coronary artery disease. Please see preoperative  history and physical for complete details. Because of his significant  diffuse disease, he was  felt to be a poor candidate for percutaneous  intervention. He was then referred to Dr. Evelene Gutierrez for consideration of  surgical revascularization. Dr. Laneta Simmers saw the patient as an outpatient and  reviewed his films and agreed that his best course of action would be to  proceed with surgical revascularization at this time, prior to receiving any  treatment for his abdominal aortic aneurysm. He explained the risks,  benefits and alternatives of surgery to the patient and his wife, and they  agreed to proceed. He underwent a preoperative workup including a carotid  Doppler study, which showed a 40-60% left internal carotid artery stenosis  with no significant stenosis on the right, as well as ABIs of greater than  1.0 on the left and 0.95 on the right. He is scheduled for outpatient  admission for his surgery.   HOSPITAL COURSE:  [name] was admitted to [facility] on September 02, 2005. He was  taken to the operating room where he underwent CABG x5 by Dr. Laneta Simmers as  described in detail above. He tolerated the procedure well and was  transferred to the SICU in stable condition. He was able to be extubated  shortly after surgery. He was hemodynamically stable and doing well on  postoperative day #1. He initially required a low-dose Neo-Synephrine drip  for some hypotension, but with this was weaned and discontinued over the  course of his first postoperative day. Also, his chest tubes and hemodynamic  monitoring devices were removed. He was kept in the unit for further  observation. He was started on a low-dose beta blocker when his blood  pressure could tolerate this. He did have a brief run of atrial fibrillation  and was started on an amiodarone drip. He converted quickly to normal sinus  rhythm and was subsequently switched to p.o. amiodarone. Because of his  newly-diagnosed diabetes, he was initially managed on the [facility]comm[employer]  protocol, and once he was taking p.o.'s, he was started on  low-dose  metformin. He remained stable, and by postoperative day #2 was ready for  transfer to the floor. Since that time, he has continued to do well. He is  maintaining normal sinus rhythm on his current medication regimen. He is  tolerating an increased dose of Lopressor with blood pressures in the 110-  120 systolic range. His blood sugars have remained fairly stable. He has  been counseled regarding a carbohydrate modified diet and is scheduled for  outpatient diabetes education. He has been mildly anemic postoperatively and  has been started on iron supplements. He has been ambulating in the hall for  cardiac rehab phase one and is making good progress. The surgical incision  sites are all healing well. He is tolerating a regular diet, and having  normal bowel and bladder function. His most recent labs on September 06, 2005  showed a hemoglobin of 8.7, hematocrit 25.8, platelets 254, white count  11.8, sodium 137, potassium 4.6, BUN 19, creatinine 1.1. He is still  somewhat volume overloaded and has been started on Lasix and is diuresing  well. He has been afebrile, and his other vital signs have been stable.  He  may be observed closely over the 24 hours. If he continues to remain stable,  hopefully he will be ready for discharge home on September 07, 2005.   DISCHARGE MEDICATIONS:  1.  Enteric-coated aspirin 325 milligrams daily.  2.  Toprol XL 25 milligrams daily.  3.  Vytorin 10/40 mg daily.  4.  Lasix 40 milligrams daily x1 week.  5.  K-Dur 20 mEq daily x 1 week.  6.  Ferrous sulfate 325 milligrams daily.  7.  Folic acid 1 milligram daily.  8.  Amiodarone 200 mg b.i.d.  9.  Glucophage 500 milligrams b.i.d.  10. Tylox one to two q.4-6 h p.r.n. pain.   DISCHARGE INSTRUCTIONS:  He is asked to refrain from driving, heavy lifting  or strenuous activity. He may continue ambulating daily and using his  incentive spirometer. A may shower daily and clean his incisions with soap and  water. He will continue a low-fat, low-sodium, carbohydrate modified  diet.   DISCHARGE FOLLOWUP:  He is asked to make an appointment see Dr. Elease Hashimoto in 2  weeks. He will have a chest x-ray at that visit. He will follow up with Dr.  Laneta Simmers on Sep 24, 2005 at 11:15 a.m. and should bring his chest x-ray to this  visit. He will call the office in the interim if he experiences any problems  or has questions.      Coral Ceo, P.A.  Gabriel Gutierrez, M.D.  Electronically Signed    GC/MEDQ  D:  09/06/2005  T:  09/07/2005  Job:  [id number]   cc:   Vesta Mixer, M.D.  Fax: [phone]   Soyla Murphy. Renne Crigler, M.D.  Fax: [phone]

## 2010-10-05 NOTE — H&P (Signed)
NAMEBURMAN, BRUINGTON             ACCOUNT NO.:  [id number]   MEDICAL RECORD NO.:  [id number]           PATIENT TYPE:   LOCATION:                                 FACILITY:   PHYSICIAN:  Quita Skye. Hart Rochester, M.D.       DATE OF BIRTH:   DATE OF ADMISSION:  12/23/2005  DATE OF DISCHARGE:                                HISTORY & PHYSICAL   CHIEF COMPLAINT:  Abdominal aortic aneurysm.   HISTORY OF PRESENT ILLNESS:  This is a 75 year old Caucasian male with past  medical history coronary artery disease, status post recent CABG, and  recently diagnosed infrarenal abdominal aortic aneurysm.  Patient was being  evaluated for an aortic stent graft, at which time he was found to have  severe coronary artery disease.  The patient was referred to Dr. Laneta Simmers and  underwent a coronary artery bypass grafting x5 in April 2007.  The patient  has done very well postoperatively and is finishing his last day of cardiac  rehab tomorrow.  During the evaluation for his AAA, the patient had a CT  scan and aortogram, which showed that he was an excellent candidate for an  aortic stent graft.  Patient's CT angio of the abdomen showed infrarenal  aortic aneurysm, question mild fatty infiltration of the liver, too small to  characterize low-density lesions in the right kidney, small abdominal  __________ nonspecific.  A CT angio of the pelvis showed mild bladder wall  thickening, bilateral inguinal hernias containing fat.  There was 39 mm  length of infrarenal neck below lowest renal artery to top of aneurysmal  aorta, 23.5 mm diameter of infrarenal neck.  Kidneys, spleen, pancreas,  stomach, and bowel were unremarkable.   The patient still remains asymptomatic.  He denies any abdominal pain,  nausea, vomiting, constipation, hematemesis, back pain, claudication  symptoms, peripheral edema, dysuria, hematuria, reflux symptoms, angina,  palpitations, any further arrhythmias.   PAST MEDICAL HISTORY:  1.  Coronary  artery disease, status post CABG.  2.  History of colostomy for perforated diverticulitis with colon resection      in 1993.  3.  Small bowel obstruction secondary to adhesions in August 1998.  4.  Hypertension.  5.  Hyperlipidemia.  6.  The patient had high blood sugars postoperatively from his CABG.  This      has resolved on its own with diet and exercise.   PAST SURGICAL HISTORY:  1.  CABG x5 in April 2007.  2.  Laparotomy in 1998.  3.  Left hand surgery due to injury, hip bone and skin graft in 1997.  4.  Cataract removal, left eye.   ALLERGIES:  No known drug allergies.   MEDICATIONS:  1.  Metoprolol 25 mg p.o. daily.  2.  Aspirin 325 mg p.o. daily.  3.  Vytorin 10/40 mg p.o. daily.  4.  Iron p.o. daily.   REVIEW OF SYSTEMS:  See HPI for pertinent positives and negatives, as well  as negative for diabetes mellitus, myocardial infarction, cerebrovascular  accident.   SOCIAL HISTORY:  Patient is married and lives at  home with his family.  The  patient has 3 children.  The patient used to smoke heavily, however, he quit  smoking in 1990.  Patient denies any alcohol use.  Patient is retired from  [employer].  He does still drive.   FAMILY HISTORY:  Mother deceased at 38, she had a history of coronary artery  disease.  Father with history of coronary artery disease.  The patient has  siblings with a history of coronary artery disease and diabetes mellitus.   PHYSICAL EXAMINATION:  VITAL SIGNS:  Blood pressure 118/55, heart rate 76,  respirations 16.  GENERAL:  This is a 75 year old Caucasian male not in acute distress.  HEAD, EYES, EARS, NOSE, THROAT:  Normocephalic.  Pupils equal, round,  reactive to light and accommodation.  Extraocular movements intact.  Oral  mucosa pink and moist.  Sclerae nonicteric.  Dentures complete in the upper  and lower.  The patient does wear hearing aids.  NECK:  Supple.  No carotid bruits appreciated.  RESPIRATORY:  Symmetrical in  respiration.  Clear to auscultation  bilaterally.  Breath sounds unlabored.  CARDIAC:  Regular rate and rhythm.  No murmur, gallop, or rub.  ABDOMEN:  Soft, nontender, nondistended.  There are normoactive bowel sounds  x4.  There is a positive pulsatile abdominal mass.  GU/RECTAL:  Deferred.  EXTREMITIES:  No edema.  Temperature:  The lower extremities are warm.  Pulses:  Radial, femoral, popliteal, dorsalis pedis, and posterior tibial  pulses are 2+ bilaterally throughout.  NEUROLOGIC:  Nonfocal.  Alert and oriented x4.  Gait steady.  Muscle  strength __________ bilaterally throughout.  Deep tendon reflexes 2+ and  symmetrical.   ASSESSMENT:  Infrarenal abdominal aortic aneurysm.   PLAN:  1.  Admit the patient to [facility] on December 23, 2005 under Dr.      Candie Chroman service.  2.  The patient will undergo a Cook stent-to-stent graft repair of his      abdominal aortic aneurysm.  3.  Patient was seen and evaluated by Dr. Hart Rochester prior to admission.  The      risks and benefits were explained to the patient in great detail and he      agrees to proceed.      Constance Holster, PA    ______________________________  Quita Skye Hart Rochester, M.D.    JMW/MEDQ  D:  12/19/2005  T:  12/19/2005  Job:  [id number]

## 2010-10-05 NOTE — Cardiovascular Report (Signed)
NAME:  SEMIR, BRILL NO.:  [id number]   MEDICAL RECORD NO.:  [id number]          PATIENT TYPE:  OIB   LOCATION:  2899                         FACILITY:  [facility]   PHYSICIAN:  Vesta Mixer, M.D. DATE OF BIRTH:  May 30, 1935   DATE OF PROCEDURE:  08/26/2005  DATE OF DISCHARGE:                              CARDIAC CATHETERIZATION   Mr. Howton is a 75 year old gentleman with a history of an abdominal aortic  aneurysm.  He had a preoperative stress Cardiolite study which revealed  inferolateral ischemia.  He is scheduled for a heart catheterization based  on these findings.  He has a very strong family history of coronary artery  disease.   PROCEDURE:  Left heart catheterization with coronary angiography.  The right  femoral artery was easily cannulated using modified Seldinger technique.  We  had some difficulty in getting up to the central circulation.  We were able  to get up to the aortic arch using a right coronary catheter and Wholey  wire.   All the catheters were exchanged out over a long exchange wire.   All of the coronary arteries were very heavily calcified.   The right coronary artery was visualized first.  It has a proximal 60%  stenosis followed by a mid 60 and 70% stenosis.  In the distal aspect of  this vessel there is a very tight 99% stenosis prior to the bifurcation.  This stenosis is on a bend and is difficult to see in the LAO views.  It is  fairly apparent in the RAO views.  The posterior descending artery and the  posterolateral segment artery have minor luminal irregularities, but are  otherwise unremarkable.   The left coronary system is very heavily calcified.  The left main has a 50%  stenosis at its ostium.   The left anterior descending artery is very heavily calcified.  There is a  proximal 50-60% stenosis followed by a mid 80% stenosis.  This mid 80%  stenosis originates at the second diagonal vessel and is somewhat hazy.  It  could be tighter than what it appears.  The distal LAD has diffuse 20-30%  stenosis.   The first diagonal artery is a fairly small vessel and has 50% stenosis.  The second diagonal vessel has a 70-80% stenosis.   The left circumflex artery is occluded proximally.  The second and third  obtuse marginal arteries fill via collaterals from the LAD.  There are minor  luminal irregularities in the circumflex vessels.   The left ventriculogram was performed in a 30 RAO position.  It reveals  normal ventricular systolic function.  He has an ejection fraction of 65-  70%.   COMPLICATIONS:  None.   CONCLUSION:  Severe three-vessel coronary artery disease.  He has very  heavily calcified vessels and really there is no role for angioplasty.  He  will most likely need coronary artery bypass grafting prior to his abdominal  aortic aneurysm surgery.           ______________________________  Vesta Mixer, M.D.     PJN/MEDQ  D:  08/26/2005  T:  08/26/2005  Job:  [id number]   cc:   Soyla Murphy. Renne Crigler, M.D.  Fax: [phone]   Quita Skye. Hart [place], M.D.  [address]  [place]  [place] [address]

## 2011-01-07 ENCOUNTER — Encounter: Payer: Self-pay | Admitting: Vascular Surgery

## 2011-02-04 ENCOUNTER — Encounter: Payer: Self-pay | Admitting: Vascular Surgery

## 2011-02-05 ENCOUNTER — Encounter (INDEPENDENT_AMBULATORY_CARE_PROVIDER_SITE_OTHER): Payer: PRIVATE HEALTH INSURANCE | Admitting: *Deleted

## 2011-02-05 ENCOUNTER — Ambulatory Visit (INDEPENDENT_AMBULATORY_CARE_PROVIDER_SITE_OTHER): Payer: PRIVATE HEALTH INSURANCE | Admitting: Vascular Surgery

## 2011-02-05 DIAGNOSIS — I714 Abdominal aortic aneurysm, without rupture: Secondary | ICD-10-CM

## 2011-02-05 DIAGNOSIS — Z48812 Encounter for surgical aftercare following surgery on the circulatory system: Secondary | ICD-10-CM

## 2011-02-05 NOTE — Assessment & Plan Note (Signed)
OFFICE VISIT  Gabriel, Gutierrez DOB:  27-Aug-1935                                       02/05/2011 CHART#:[id number]  The patient returns for his annual followup regarding his aortic stent graft which I placed in 2007 with a Cook Zenith bifurcated graft.  He has done well since that time with gradual contraction of the graft. Last year he had a duplex scan of the graft and today he had a duplex scan of the graft.  I have reviewed and interpreted these and it appears that there is minimal change.  The aneurysm sac size appears to be about 3.3 to 3.4 cm maximum diameter with no evidence of an endoleak.  It is difficult to visualize the sac because of bowel gas, however.  He denies any chest or abdominal symptoms.  CHRONIC MEDICAL PROBLEMS: 1. Type 2 diabetes mellitus. 2. Hyperlipidemia. 3. Coronary artery disease, previous coronary artery bypass grafting     in 2007. 4. History of colon rupture. 5. Negative for stroke, COPD or hypertension.  SOCIAL HISTORY:  He is retired and has 3 children.  Does not use tobacco or alcohol.  REVIEW OF SYSTEMS:  Continues to be negative for chest pain, dyspnea on exertion, claudication.  Does have occasional wheezing, arthritis and decreased hearing.  Also has urinary frequency.  All other systems are negative.  PHYSICAL EXAM:  Vital signs:  Blood pressure is 158/52, heart rate 79, respirations 20.  General:  He is a well-developed, well-nourished male in no apparent distress, alert and oriented x3.  HEENT:  Exam normal for age.  Lungs:  Clear to auscultation.  No rhonchi or wheezing. Cardiovascular:  Regular rhythm.  No murmurs.  Carotid pulses 3+.  No audible bruits.  Abdomen:  Soft, nontender, no pulsatile mass noted. Musculoskeletal:  Free of major deformities.  Lower extremities:  3+ femoral, popliteal and dorsalis pedis pulses palpable.  IMPRESSION:  There is no change in his aneurysm sac following aortic stent  grafting 5 years ago.  He will return in 1 year with CT angiogram to further evaluate this on an ongoing basis.    Quita Skye Hart [place], M.D. Electronically Signed  JDL/MEDQ  D:  02/05/2011  T:  02/05/2011  Job:  [id number]

## 2011-02-12 NOTE — Progress Notes (Signed)
Subjective:     Patient ID: Gabriel Gutierrez, male   DOB: 1936-01-25, 75 y.o.   MRN: [id number]  HPI Power outage, note scanned into EPIC   Review of Systems     Objective:   Physical Exam     Assessment:         Plan:

## 2011-02-13 NOTE — Procedures (Unsigned)
VASCULAR LAB EXAM  INDICATION:  Follow up AAA Endograft placed 12/23/2005.  HISTORY: Diabetes:  Yes. Cardiac:  Yes. Hypertension:  No.  EXAM: 1. AAA sac size 3.38 cm AP, NV cm transverse 2. Previous sac size 02/06/2010:  2.9 cm AP, 3.1 cm transverse.  Previous exam was difficult to visualize the AAA sac.  IMPRESSION: 1. The aorta and Endograft appear patent. 2. No significant change in size of the aneurysmal sac surrounding the     Endograft.  However, the entire graft was difficult to visualize     due to bowel gas. 3. No evidence of endoleak was detected.  ___________________________________________ Quita Skye. Hart [place], M.D.  LT/MEDQ  D:  02/05/2011  T:  02/05/2011  Job:  [id number]

## 2011-02-25 ENCOUNTER — Encounter: Payer: Self-pay | Admitting: *Deleted

## 2011-02-26 ENCOUNTER — Ambulatory Visit: Payer: Self-pay | Admitting: Cardiovascular Disease

## 2011-03-13 ENCOUNTER — Encounter: Payer: Self-pay | Admitting: Cardiovascular Disease

## 2011-03-14 ENCOUNTER — Telehealth: Payer: Self-pay | Admitting: Cardiovascular Disease

## 2011-03-20 ENCOUNTER — Encounter: Payer: Self-pay | Admitting: Cardiovascular Disease

## 2011-03-20 ENCOUNTER — Ambulatory Visit (INDEPENDENT_AMBULATORY_CARE_PROVIDER_SITE_OTHER): Payer: Medicare Other | Admitting: Cardiovascular Disease

## 2011-03-20 DIAGNOSIS — E785 Hyperlipidemia, unspecified: Secondary | ICD-10-CM

## 2011-03-20 DIAGNOSIS — I2510 Atherosclerotic heart disease of native coronary artery without angina pectoris: Secondary | ICD-10-CM

## 2011-03-20 DIAGNOSIS — I10 Essential (primary) hypertension: Secondary | ICD-10-CM | POA: Insufficient documentation

## 2011-03-20 MED ORDER — METOPROLOL SUCCINATE ER 25 MG PO TB24: 25.0000 mg | ORAL_TABLET | Freq: Every day | ORAL | Status: DC

## 2011-03-20 MED ORDER — ATORVASTATIN CALCIUM 80 MG PO TABS: 80.0000 mg | ORAL_TABLET | Freq: Every day | ORAL | Status: DC

## 2011-03-20 NOTE — Assessment & Plan Note (Signed)
His blood pressure is a little bit high. He admits eating extra salt. We'll have him cut back a sausage egg. Alternatively we may need to increase his benazepril or perhaps start him on a different medication.  We'll consider adding HCTZ / KCl or increasing his benazepril at his next visit his blood pressure remains elevated.

## 2011-03-20 NOTE — Progress Notes (Signed)
Gabriel Gutierrez Date of Birth  Oct 13, 1935 [facility] [address]    [address] [place], [place]  [address] [phone]  Fax  [phone]  History of Present Illness:  Gabriel Gutierrez is a 75 yo gentleman with a history of coronary artery disease-status post coronary artery bypass grafting in 2007. He has a history of hypertension. He admits to eating a little bit of extra salt recently.  He remains very active. 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    Current Outpatient Prescriptions on File Prior to Visit  Medication Sig Dispense Refill  . aspirin 81 MG EC tablet Take 81 mg by mouth daily.       [name] atorvastatin (LIPITOR) 80 MG tablet Take 80 mg by mouth daily.        . benazepril (LOTENSIN) 20 MG tablet Take 20 mg by mouth daily.        . fish oil-omega-3 fatty acids 1000 MG capsule Take 1,000 mg by mouth daily.       . metFORMIN (GLUCOPHAGE) 500 MG tablet Take 500 mg by mouth 2 (two) times daily with a meal.        . metoprolol succinate (TOPROL-XL) 25 MG 24 hr tablet Take 25 mg by mouth daily.          No Known Allergies  Past Medical History  Diagnosis Date  . Diabetes mellitus Age 74  . Hyperlipidemia   . AAA (abdominal aortic aneurysm)   . Hypertension   . Coronary artery disease     post CABG x5 --Severe three-vessel coronary disease    Past Surgical History  Procedure Date  . Pr vein bypass graft,aorto-fem-pop 12/23/2005    Infrarenal abdominal aortic aneurysm, right common iliac occlusive disease --   . Colon surgery     ruptured colon  . Median sternotomy   . Hand surgery 1997  . Cataract extraction     left eye  . Skin graft 1992  . Cardiac catheterization 08/26/2005    Est. EF of 65-70% -- Severe three-vessel coronary artery disease -- very heavily calcified vessels and really there is no role for angioplasty  -- he heavily calcified vessels and  really there is no role for angioplasty abdominal aortic aneurysm surgery  . Coronary artery bypass graft 09/02/2005    x 5 -- left internal mammary artery graft to the left anterior descending coronary artery, with a saphenous vein graft to the diagonal branch of the LAD, a sequential saphenous vein graft to the second and fourth obtuse marginal branches of the left circumflex coronary artery, and a saphenous vein graft to the posterior descending branch of RCA  -- Endoscopic vein harvesting from the left leg  . Laparotomy 1998    History  Smoking status  . Former Smoker -- 2.0 packs/day for 44 years  . Types: Cigarettes  . Quit date: 05/20/1988  Smokeless tobacco  . Not on file    History  Alcohol Use No    Family History  Problem Relation Age of Onset  . Coronary artery disease Mother   . Coronary artery disease Father   . Diabetes Sister   . Coronary artery disease Sister     Reviw of Systems:  Reviewed in the HPI.  All other systems are negative.  Physical Exam: BP 162/63  Pulse 59  Ht 5\' 6"  (  1.676 m)  Wt 165 lb 12.8 oz (75.206 kg)  BMI 26.76 kg/m2 The patient is alert and oriented x 3.  The mood and affect are normal.   Skin: warm and dry.  Color is normal.    HEENT:   [place]/AT, no JVD, normal carotids  Lungs: clear   Heart: RR, no murmurs    Abdomen: good BS, non tender  Extremities:  No  C/c/e  Neuro:  Non focal    ECG: Sinus bradycardia. He has left ventricular hypertrophy. Has nonspecific T-wave abnormality.  Assessment / Plan:

## 2011-03-20 NOTE — Assessment & Plan Note (Signed)
Gabriel Gutierrez is doing very well. We'll continue with the same medications. I am pleased that he is not having any angina. His blood pressure is a little high. I've asked him to cut back on his salt intake.  I see him again in 6 months.

## 2011-03-20 NOTE — Patient Instructions (Signed)
Your physician wants you to follow-up in: 6 months You will receive a reminder letter in the mail two months in advance. If you don't receive a letter, please call our office to schedule the follow-up appointment.   Your physician recommends that you return for a FASTING lipid profile: 6 months   Your medications were refilled today with [facility]

## 2011-04-18 ENCOUNTER — Encounter: Payer: Self-pay | Admitting: Cardiovascular Disease

## 2011-05-27 DIAGNOSIS — I10 Essential (primary) hypertension: Secondary | ICD-10-CM | POA: Diagnosis not present

## 2011-05-27 DIAGNOSIS — E7800 Pure hypercholesterolemia, unspecified: Secondary | ICD-10-CM | POA: Diagnosis not present

## 2011-05-27 DIAGNOSIS — E119 Type 2 diabetes mellitus without complications: Secondary | ICD-10-CM | POA: Diagnosis not present

## 2011-08-22 DIAGNOSIS — E7800 Pure hypercholesterolemia, unspecified: Secondary | ICD-10-CM | POA: Diagnosis not present

## 2011-08-22 DIAGNOSIS — E119 Type 2 diabetes mellitus without complications: Secondary | ICD-10-CM | POA: Diagnosis not present

## 2011-08-22 DIAGNOSIS — I10 Essential (primary) hypertension: Secondary | ICD-10-CM | POA: Diagnosis not present

## 2011-08-30 ENCOUNTER — Encounter: Payer: Self-pay | Admitting: Cardiovascular Disease

## 2011-08-30 ENCOUNTER — Ambulatory Visit (INDEPENDENT_AMBULATORY_CARE_PROVIDER_SITE_OTHER): Payer: Medicare Other | Admitting: Cardiovascular Disease

## 2011-08-30 VITALS — BP 110/60 | HR 60 | Ht 67.0 in | Wt 166.8 lb

## 2011-08-30 DIAGNOSIS — E785 Hyperlipidemia, unspecified: Secondary | ICD-10-CM

## 2011-08-30 DIAGNOSIS — I2510 Atherosclerotic heart disease of native coronary artery without angina pectoris: Secondary | ICD-10-CM

## 2011-08-30 NOTE — Progress Notes (Signed)
[name] Date of Birth  [birth year] [facility] [address]    [address] [place], [place]  [address] [phone]  Fax  [phone]  Problem list: 1. Coronary artery disease-status post CABG 2. Abdominal aortic aneurysm-Status post stent grafting 3. Diabetes mellitus 4. Dyslipidemia 5. Hypertension  History of Present Illness:  [name] is a 76 yo gentleman with a history of coronary artery disease-status post coronary artery bypass grafting in 2007. He has a history of hypertension. He admits to eating a little bit of extra salt recently.  He remains very active. 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      Current Outpatient Prescriptions on File Prior to Visit  Medication Sig Dispense Refill  . aspirin 81 MG EC tablet Take 81 mg by mouth daily.       [name] atorvastatin (LIPITOR) 80 MG tablet Take 1 tablet (80 mg total) by mouth daily.  90 tablet  3  . benazepril-hydrochlorthiazide (LOTENSIN HCT) 20-12.5 MG per tablet Take 1 tablet by mouth daily.      . fish oil-omega-3 fatty acids 1000 MG capsule Take 1,000 mg by mouth daily.       . metFORMIN (GLUCOPHAGE) 500 MG tablet Take 500 mg by mouth 2 (two) times daily with a meal.        . metoprolol succinate (TOPROL-XL) 25 MG 24 hr tablet Take 1 tablet (25 mg total) by mouth daily.  90 tablet  3    No Known Allergies  Past Medical History  Diagnosis Date  . Diabetes mellitus Age 78  . Hyperlipidemia   . AAA (abdominal aortic aneurysm)   . Hypertension   . Coronary artery disease     post CABG x5 --Severe three-vessel coronary disease    Past Surgical History  Procedure Date  . Pr vein bypass graft,aorto-fem-pop 12/23/2005    Infrarenal abdominal aortic aneurysm, right common iliac occlusive disease --   . Colon surgery     ruptured colon  . Median sternotomy   . Hand surgery 1997  . Cataract  extraction     left eye  . Skin graft 1992  . Cardiac catheterization 08/26/2005    Est. EF of 65-70% -- Severe three-vessel coronary artery disease -- very heavily calcified vessels and really there is no role for angioplasty  -- he heavily calcified vessels and really there is no role for angioplasty abdominal aortic aneurysm surgery  . Coronary artery bypass graft 09/02/2005    x 5 -- left internal mammary artery graft to the left anterior descending coronary artery, with a saphenous vein graft to the diagonal branch of the LAD, a sequential saphenous vein graft to the second and fourth obtuse marginal branches of the left circumflex coronary artery, and a saphenous vein graft to the posterior descending branch of RCA  -- Endoscopic vein harvesting from the left leg  . Laparotomy 1998    History  Smoking status  . Former Smoker -- 2.0 packs/day for 44 years  . Types: Cigarettes  . Quit date: 05/20/1988  Smokeless tobacco  . Not on file    History  Alcohol Use No    Family History  Problem Relation Age of Onset  . Coronary artery disease Mother   . Coronary artery disease Father   . Diabetes Sister   . Coronary artery disease Sister  Reviw of Systems:  Reviewed in the HPI.  All other systems are negative.  Physical Exam: BP 110/60  Pulse 60  Wt 166 lb 12.8 oz (75.66 kg) The patient is alert and oriented x 3.  The mood and affect are normal.   Skin: warm and dry.  Color is normal.    HEENT:   [place]/AT, no JVD, normal carotids  Lungs: clear   Heart: RR, no murmurs    Abdomen: good BS, non tender  Extremities:  No  C/c/e  Neuro:  Non focal    ECG:  Assessment / Plan:

## 2011-08-30 NOTE — Patient Instructions (Signed)
Your physician wants you to follow-up in: 6 months with Dr. Elease Hashimoto.  You will receive a reminder letter in the mail two months in advance. If you don't receive a letter, please call our office to schedule the follow-up appointment.  Your physician recommends that you return for fasting lab work in: 6 months:  Lipids, Liver & BMET.

## 2011-08-30 NOTE — Assessment & Plan Note (Signed)
Gabriel Gutierrez is  doing very well. He works hard every day. 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 I am pleased that he is doing so well.  His lipid levels were performed at his medical doctors office. His LDL is 80. He's on Lipitor 80 mg a day.  At one point he thought he may be having some muscle aches related to Lipitor but he doesn't appear to be related. He's tried lower doses of Lipitor and it doesn't seem to make any difference in his legs pain and weakness.  We'll continue with current dose of Lipitor. He'll continue with his same medications. I'll see him again in 6 months for office visit and fasting labs.

## 2011-09-11 NOTE — Telephone Encounter (Signed)
Close  

## 2011-10-28 DIAGNOSIS — H35379 Puckering of macula, unspecified eye: Secondary | ICD-10-CM | POA: Diagnosis not present

## 2011-10-28 DIAGNOSIS — Z961 Presence of intraocular lens: Secondary | ICD-10-CM | POA: Diagnosis not present

## 2011-10-28 DIAGNOSIS — H26499 Other secondary cataract, unspecified eye: Secondary | ICD-10-CM | POA: Diagnosis not present

## 2011-10-28 DIAGNOSIS — H43399 Other vitreous opacities, unspecified eye: Secondary | ICD-10-CM | POA: Diagnosis not present

## 2011-10-30 ENCOUNTER — Encounter: Payer: Self-pay | Admitting: Cardiovascular Disease

## 2011-11-25 DIAGNOSIS — E119 Type 2 diabetes mellitus without complications: Secondary | ICD-10-CM | POA: Diagnosis not present

## 2011-11-25 DIAGNOSIS — E785 Hyperlipidemia, unspecified: Secondary | ICD-10-CM | POA: Diagnosis not present

## 2011-11-25 DIAGNOSIS — E7800 Pure hypercholesterolemia, unspecified: Secondary | ICD-10-CM | POA: Diagnosis not present

## 2011-11-25 DIAGNOSIS — Z79899 Other long term (current) drug therapy: Secondary | ICD-10-CM | POA: Diagnosis not present

## 2011-11-25 DIAGNOSIS — I10 Essential (primary) hypertension: Secondary | ICD-10-CM | POA: Diagnosis not present

## 2011-11-26 ENCOUNTER — Encounter: Payer: Self-pay | Admitting: Cardiovascular Disease

## 2012-01-14 DIAGNOSIS — E119 Type 2 diabetes mellitus without complications: Secondary | ICD-10-CM | POA: Diagnosis not present

## 2012-02-06 ENCOUNTER — Other Ambulatory Visit: Payer: Self-pay | Admitting: Vascular Surgery

## 2012-02-06 DIAGNOSIS — I712 Thoracic aortic aneurysm, without rupture: Secondary | ICD-10-CM | POA: Diagnosis not present

## 2012-02-07 LAB — CREATININE, SERUM: Creat: 1.42 mg/dL — ABNORMAL HIGH (ref 0.50–1.35)

## 2012-02-07 LAB — BUN: BUN: 21 mg/dL (ref 6–23)

## 2012-02-10 ENCOUNTER — Encounter: Payer: Self-pay | Admitting: Vascular Surgery

## 2012-02-11 ENCOUNTER — Ambulatory Visit
Admission: RE | Admit: 2012-02-11 | Discharge: 2012-02-11 | Disposition: A | Discharge status: Home / Self Care | Payer: Medicare Other | Source: Ambulatory Visit | Attending: Vascular Surgery | Admitting: Vascular Surgery

## 2012-02-11 ENCOUNTER — Ambulatory Visit (INDEPENDENT_AMBULATORY_CARE_PROVIDER_SITE_OTHER): Payer: Medicare Other | Admitting: Vascular Surgery

## 2012-02-11 ENCOUNTER — Encounter: Payer: Self-pay | Admitting: Vascular Surgery

## 2012-02-11 VITALS — BP 155/73 | HR 106 | Resp 20 | Ht 66.0 in | Wt 170.0 lb

## 2012-02-11 DIAGNOSIS — I701 Atherosclerosis of renal artery: Secondary | ICD-10-CM | POA: Diagnosis not present

## 2012-02-11 DIAGNOSIS — I723 Aneurysm of iliac artery: Secondary | ICD-10-CM | POA: Diagnosis not present

## 2012-02-11 DIAGNOSIS — I714 Abdominal aortic aneurysm, without rupture, unspecified: Secondary | ICD-10-CM

## 2012-02-11 DIAGNOSIS — Z48812 Encounter for surgical aftercare following surgery on the circulatory system: Secondary | ICD-10-CM

## 2012-02-11 MED ORDER — IOHEXOL 350 MG/ML SOLN
60.0000 mL | Freq: Once | INTRAVENOUS | Status: AC | PRN
  Administered 2012-02-11: 60 mL via INTRAVENOUS

## 2012-02-11 NOTE — Progress Notes (Signed)
Subjective:     Patient ID: Gabriel Gutierrez, male   DOB: 1935-10-09, 76 y.o.   MRN: [id number]  HPI this 76 year old male returns for continued followup regarding his aortobiiliac Gabriel Gutierrez Zenith stent graft placed by me in 2007 for abdominal aortic aneurysm. He has done well with no systemic complaints. He denies abdominal and back pain. He is quite active. He ambulates 3 miles without difficulty other than his knees  Past Medical History  Diagnosis Date  . Diabetes mellitus Age 76  . Hyperlipidemia   . AAA (abdominal aortic aneurysm)   . Hypertension   . Coronary artery disease     post CABG x5 --Severe three-vessel coronary disease    History  Substance Use Topics  . Smoking status: Former Smoker -- 2.0 packs/day for 44 years    Types: Cigarettes    Quit date: 05/20/1988  . Smokeless tobacco: Former Neurosurgeon    Quit date: 02/11/2003  . Alcohol Use: No    Family History  Problem Relation Age of Onset  . Coronary artery disease Mother   . Coronary artery disease Father   . Diabetes Sister   . Coronary artery disease Sister     No Known Allergies  Current outpatient prescriptions:aspirin 81 MG EC tablet, Take 81 mg by mouth daily. , Disp: , Rfl: ;  atorvastatin (LIPITOR) 80 MG tablet, Take 1 tablet (80 mg total) by mouth daily., Disp: 90 tablet, Rfl: 3;  benazepril-hydrochlorthiazide (LOTENSIN HCT) 20-12.5 MG per tablet, Take 1 tablet by mouth daily., Disp: , Rfl: ;  fish oil-omega-3 fatty acids 1000 MG capsule, Take 2 g by mouth daily. , Disp: , Rfl:  metFORMIN (GLUCOPHAGE) 500 MG tablet, Take 500 mg by mouth 2 (two) times daily with a meal.  , Disp: , Rfl: ;  metoprolol succinate (TOPROL-XL) 25 MG 24 hr tablet, Take 1 tablet (25 mg total) by mouth daily., Disp: 90 tablet, Rfl: 3 No current facility-administered medications for this visit. Facility-Administered Medications Ordered in Other Visits: iohexol (OMNIPAQUE) 350 MG/ML injection 60 mL, 60 mL, Intravenous, Once PRN, Malachy Moan, MD, 60 mL at 02/11/12 1317  BP 155/73  Pulse 106  Resp 20  Ht 5\' 6"  (1.676 m)  Wt 170 lb (77.111 kg)  BMI 27.44 kg/m2  Body mass index is 27.44 kg/(m^2).         Review of Systems denies chest pain, dyspnea on exertion, PND, orthopnea, claudication, lateralizing weakness, amaurosis fugax, diplopia. Complains mainly of left knee pain from arthritis.    Objective:   Physical Exam blood pressure 155/73 heart rate 106 respirations 20 Gen.-alert and oriented x3 in no apparent distress HEENT normal for age Lungs no rhonchi or wheezing Cardiovascular regular rhythm no murmurs carotid pulses 3+ palpable no bruits audible Abdomen soft nontender no palpable masses Musculoskeletal free of  major deformities Skin clear -no rashes Neurologic normal Lower extremities 3+ femoral and dorsalis pedis pulses palpable bilaterally with no edema  Today I ordered a CT angiogram which are reviewed by computer. There is no evidence of endoleak. The aneurysm sac is totally contracted around the old aortic graft with no evidence of endoleak.    Assessment:     Doing well 6 years post insertion aortobiiliac Cook Zenith stent graft for a 5 cm abdominal aortic aneurysm    Plan:     Return in one year with duplex scan of the abdominal aorta and see nurse practitioner

## 2012-02-11 NOTE — Addendum Note (Signed)
Addended by: Sharee Pimple on: 02/11/2012 04:09 PM   Modules accepted: Orders

## 2012-02-18 ENCOUNTER — Inpatient Hospital Stay (HOSPITAL_COMMUNITY)
Admission: EM | Admit: 2012-02-18 | Discharge: 2012-02-20 | DRG: 065 | Disposition: A | Discharge status: Home / Self Care | Payer: Medicare Other | Attending: Internal Medicine | Admitting: Internal Medicine

## 2012-02-18 ENCOUNTER — Encounter (HOSPITAL_COMMUNITY): Payer: Self-pay | Admitting: Emergency Medicine

## 2012-02-18 ENCOUNTER — Emergency Department (HOSPITAL_COMMUNITY): Payer: Medicare Other

## 2012-02-18 ENCOUNTER — Observation Stay (HOSPITAL_COMMUNITY): Payer: Medicare Other

## 2012-02-18 DIAGNOSIS — I2510 Atherosclerotic heart disease of native coronary artery without angina pectoris: Secondary | ICD-10-CM | POA: Diagnosis present

## 2012-02-18 DIAGNOSIS — I6350 Cerebral infarction due to unspecified occlusion or stenosis of unspecified cerebral artery: Secondary | ICD-10-CM

## 2012-02-18 DIAGNOSIS — I639 Cerebral infarction, unspecified: Secondary | ICD-10-CM

## 2012-02-18 DIAGNOSIS — I6789 Other cerebrovascular disease: Secondary | ICD-10-CM | POA: Diagnosis not present

## 2012-02-18 DIAGNOSIS — R29810 Facial weakness: Secondary | ICD-10-CM | POA: Diagnosis not present

## 2012-02-18 DIAGNOSIS — Z7901 Long term (current) use of anticoagulants: Secondary | ICD-10-CM | POA: Diagnosis not present

## 2012-02-18 DIAGNOSIS — I6340 Cerebral infarction due to embolism of unspecified cerebral artery: Secondary | ICD-10-CM | POA: Diagnosis present

## 2012-02-18 DIAGNOSIS — Z951 Presence of aortocoronary bypass graft: Secondary | ICD-10-CM | POA: Diagnosis not present

## 2012-02-18 DIAGNOSIS — I714 Abdominal aortic aneurysm, without rupture, unspecified: Secondary | ICD-10-CM

## 2012-02-18 DIAGNOSIS — E785 Hyperlipidemia, unspecified: Secondary | ICD-10-CM | POA: Diagnosis present

## 2012-02-18 DIAGNOSIS — Z79899 Other long term (current) drug therapy: Secondary | ICD-10-CM

## 2012-02-18 DIAGNOSIS — G8190 Hemiplegia, unspecified affecting unspecified side: Secondary | ICD-10-CM | POA: Diagnosis present

## 2012-02-18 DIAGNOSIS — Z87891 Personal history of nicotine dependence: Secondary | ICD-10-CM

## 2012-02-18 DIAGNOSIS — I10 Essential (primary) hypertension: Secondary | ICD-10-CM | POA: Diagnosis present

## 2012-02-18 DIAGNOSIS — I4891 Unspecified atrial fibrillation: Secondary | ICD-10-CM

## 2012-02-18 DIAGNOSIS — G459 Transient cerebral ischemic attack, unspecified: Secondary | ICD-10-CM | POA: Diagnosis not present

## 2012-02-18 DIAGNOSIS — G939 Disorder of brain, unspecified: Secondary | ICD-10-CM | POA: Diagnosis not present

## 2012-02-18 DIAGNOSIS — R4789 Other speech disturbances: Secondary | ICD-10-CM | POA: Diagnosis present

## 2012-02-18 DIAGNOSIS — R5381 Other malaise: Secondary | ICD-10-CM | POA: Diagnosis not present

## 2012-02-18 DIAGNOSIS — I059 Rheumatic mitral valve disease, unspecified: Secondary | ICD-10-CM | POA: Diagnosis not present

## 2012-02-18 DIAGNOSIS — Z833 Family history of diabetes mellitus: Secondary | ICD-10-CM | POA: Diagnosis not present

## 2012-02-18 DIAGNOSIS — R29818 Other symptoms and signs involving the nervous system: Secondary | ICD-10-CM | POA: Diagnosis not present

## 2012-02-18 DIAGNOSIS — I499 Cardiac arrhythmia, unspecified: Secondary | ICD-10-CM | POA: Insufficient documentation

## 2012-02-18 DIAGNOSIS — E119 Type 2 diabetes mellitus without complications: Secondary | ICD-10-CM | POA: Diagnosis not present

## 2012-02-18 DIAGNOSIS — Z8249 Family history of ischemic heart disease and other diseases of the circulatory system: Secondary | ICD-10-CM | POA: Diagnosis not present

## 2012-02-18 HISTORY — DX: Cardiac arrhythmia, unspecified: I49.9

## 2012-02-18 HISTORY — DX: Cerebral infarction, unspecified: I63.9

## 2012-02-18 LAB — CBC
MCH: 29.3 pg (ref 26.0–34.0)
MCHC: 33.2 g/dL (ref 30.0–36.0)
MCV: 88 fL (ref 78.0–100.0)
Platelets: 258 10*3/uL (ref 150–400)
RBC: 4.34 MIL/uL (ref 4.22–5.81)

## 2012-02-18 LAB — COMPREHENSIVE METABOLIC PANEL
AST: 26 U/L (ref 0–37)
Albumin: 4.1 g/dL (ref 3.5–5.2)
BUN: 23 mg/dL (ref 6–23)
CO2: 29 mEq/L (ref 19–32)
Calcium: 9.8 mg/dL (ref 8.4–10.5)
Creatinine, Ser: 1.28 mg/dL (ref 0.50–1.35)
Creatinine, Ser: 1.21 mg/dL (ref 0.50–1.35)
GFR calc Af Amer: 66 mL/min — ABNORMAL LOW (ref >90)
GFR calc non Af Amer: 57 mL/min — ABNORMAL LOW (ref >90)
Total Protein: 6.7 g/dL (ref 6.0–8.3)
Total Protein: 6.9 g/dL (ref 6.0–8.3)

## 2012-02-18 LAB — URINALYSIS, ROUTINE W REFLEX MICROSCOPIC
Glucose, UA: NEGATIVE mg/dL
Leukocytes, UA: NEGATIVE
Nitrite: NEGATIVE
Protein, ur: NEGATIVE mg/dL
pH: 5.5 (ref 5.0–8.0)

## 2012-02-18 LAB — CBC WITH DIFFERENTIAL/PLATELET
Basophils Relative: 0 % (ref 0–1)
Eosinophils Absolute: 0.3 10*3/uL (ref 0.0–0.7)
Eosinophils Relative: 3 % (ref 0–5)
HCT: 37.5 % — ABNORMAL LOW (ref 39.0–52.0)
Hemoglobin: 12.5 g/dL — ABNORMAL LOW (ref 13.0–17.0)
MCH: 29.5 pg (ref 26.0–34.0)
MCHC: 33.3 g/dL (ref 30.0–36.0)
Monocytes Absolute: 1 10*3/uL (ref 0.1–1.0)
Monocytes Relative: 9 % (ref 3–12)
Neutrophils Relative %: 77 % (ref 43–77)

## 2012-02-18 LAB — LIPID PANEL
HDL: 48 mg/dL (ref >39)
LDL Cholesterol: 61 mg/dL (ref 0–99)
Total CHOL/HDL Ratio: 2.6 RATIO
Triglycerides: 87 mg/dL (ref <150)

## 2012-02-18 LAB — URINE MICROSCOPIC-ADD ON

## 2012-02-18 LAB — PROTIME-INR: Prothrombin Time: 13.6 seconds (ref 11.6–15.2)

## 2012-02-18 LAB — APTT: aPTT: 33 seconds (ref 24–37)

## 2012-02-18 MED ORDER — ACETAMINOPHEN 325 MG PO TABS: 650.0000 mg | ORAL_TABLET | ORAL | Status: DC | PRN

## 2012-02-18 MED ORDER — ASPIRIN 325 MG PO TABS
325.0000 mg | ORAL_TABLET | Freq: Every day | ORAL | Status: DC
  Administered 2012-02-18: 325 mg via ORAL
  Filled 2012-02-18: qty 1

## 2012-02-18 MED ORDER — OMEGA-3-ACID ETHYL ESTERS 1 G PO CAPS
2.0000 g | ORAL_CAPSULE | Freq: Every day | ORAL | Status: DC
  Administered 2012-02-19 – 2012-02-20 (×2): 2 g via ORAL
  Filled 2012-02-18 (×2): qty 2

## 2012-02-18 MED ORDER — ASPIRIN 325 MG PO TABS: 325.0000 mg | ORAL_TABLET | Freq: Every day | ORAL | Status: DC

## 2012-02-18 MED ORDER — ASPIRIN 300 MG RE SUPP: 300.0000 mg | Freq: Every day | RECTAL | Status: DC

## 2012-02-18 MED ORDER — ONDANSETRON HCL 4 MG/2ML IJ SOLN: 4.0000 mg | Freq: Three times a day (TID) | INTRAMUSCULAR | Status: AC | PRN

## 2012-02-18 MED ORDER — BENAZEPRIL HCL 20 MG PO TABS
20.0000 mg | ORAL_TABLET | Freq: Every day | ORAL | Status: DC
  Administered 2012-02-19: 20 mg via ORAL
  Filled 2012-02-18: qty 1

## 2012-02-18 MED ORDER — INSULIN ASPART 100 UNIT/ML ~~LOC~~ SOLN
0.0000 [IU] | Freq: Three times a day (TID) | SUBCUTANEOUS | Status: DC
  Administered 2012-02-20: 2 [IU] via SUBCUTANEOUS

## 2012-02-18 MED ORDER — ATORVASTATIN CALCIUM 80 MG PO TABS
80.0000 mg | ORAL_TABLET | Freq: Every day | ORAL | Status: DC
  Administered 2012-02-19 – 2012-02-20 (×2): 80 mg via ORAL
  Filled 2012-02-18 (×2): qty 1

## 2012-02-18 MED ORDER — METOPROLOL SUCCINATE ER 25 MG PO TB24
25.0000 mg | ORAL_TABLET | Freq: Every day | ORAL | Status: DC
  Administered 2012-02-19: 25 mg via ORAL
  Filled 2012-02-18 (×2): qty 1

## 2012-02-18 MED ORDER — SODIUM CHLORIDE 0.9 % IV SOLN
INTRAVENOUS | Status: AC
  Administered 2012-02-19: 03:00:00 via INTRAVENOUS

## 2012-02-18 MED ORDER — SENNOSIDES-DOCUSATE SODIUM 8.6-50 MG PO TABS
1.0000 | ORAL_TABLET | Freq: Every evening | ORAL | Status: DC | PRN
  Filled 2012-02-18: qty 1

## 2012-02-18 MED ORDER — ENOXAPARIN SODIUM 40 MG/0.4ML ~~LOC~~ SOLN: 40.0000 mg | SUBCUTANEOUS | Status: DC

## 2012-02-18 MED ORDER — ENOXAPARIN SODIUM 40 MG/0.4ML ~~LOC~~ SOLN
40.0000 mg | SUBCUTANEOUS | Status: DC
  Administered 2012-02-18 – 2012-02-19 (×2): 40 mg via SUBCUTANEOUS
  Filled 2012-02-18 (×6): qty 0.4

## 2012-02-18 NOTE — ED Provider Notes (Signed)
History     CSN: [id number]  Arrival date & time 02/18/12  1056   First MD Initiated Contact with Patient 02/18/12 1113      Chief Complaint  Patient presents with  . Transient Ischemic Attack    (Consider location/radiation/quality/duration/timing/severity/associated sxs/prior treatment) The history is provided by the patient.   Patient here after having a one and a half hour episode of slurred speech with left-sided facial droop and left-sided weakness. Symptoms have since resolved. Denies any headache or blurred vision. No abdominal or chest pain. No prior history of same. Nothing made her symptoms better or worse. N. no treatment used prior to arrival Past Medical History  Diagnosis Date  . Diabetes mellitus Age 76  . Hyperlipidemia   . AAA (abdominal aortic aneurysm)   . Hypertension   . Coronary artery disease     post CABG x5 --Severe three-vessel coronary disease    Past Surgical History  Procedure Date  . Pr vein bypass graft,aorto-fem-pop 12/23/2005    Infrarenal abdominal aortic aneurysm, right common iliac occlusive disease --   . Colon surgery     ruptured colon  . Median sternotomy   . Hand surgery 1997  . Cataract extraction     left eye  . Skin graft 1992  . Cardiac catheterization 08/26/2005    Est. EF of 65-70% -- Severe three-vessel coronary artery disease -- very heavily calcified vessels and really there is no role for angioplasty  -- he heavily calcified vessels and really there is no role for angioplasty abdominal aortic aneurysm surgery  . Coronary artery bypass graft 09/02/2005    x 5 -- left internal mammary artery graft to the left anterior descending coronary artery, with a saphenous vein graft to the diagonal branch of the LAD, a sequential saphenous vein graft to the second and fourth obtuse marginal branches of the left circumflex coronary artery, and a saphenous vein graft to the posterior descending branch of RCA  -- Endoscopic vein  harvesting from the left leg  . Laparotomy 1998    Family History  Problem Relation Age of Onset  . Coronary artery disease Mother   . Coronary artery disease Father   . Diabetes Sister   . Coronary artery disease Sister     History  Substance Use Topics  . Smoking status: Former Smoker -- 2.0 packs/day for 44 years    Types: Cigarettes    Quit date: 05/20/1988  . Smokeless tobacco: Former Neurosurgeon    Quit date: 02/11/2003  . Alcohol Use: No      Review of Systems  All other systems reviewed and are negative.    Allergies  Review of patient's allergies indicates no known allergies.  Home Medications   Current Outpatient Rx  Name Route Sig Dispense Refill  . ASPIRIN 81 MG PO TBEC Oral Take 81 mg by mouth daily.     . ATORVASTATIN CALCIUM 80 MG PO TABS Oral Take 1 tablet (80 mg total) by mouth daily. 90 tablet 3  . BENAZEPRIL-HYDROCHLOROTHIAZIDE 20-12.5 MG PO TABS Oral Take 1 tablet by mouth daily.    . OMEGA-3 FATTY ACIDS 1000 MG PO CAPS Oral Take 2 g by mouth daily.     [name] METFORMIN HCL 500 MG PO TABS Oral Take 500 mg by mouth 2 (two) times daily with a meal.      . METOPROLOL SUCCINATE ER 25 MG PO TB24 Oral Take 1 tablet (25 mg total) by mouth daily. 90 tablet 3  BP 174/90  Pulse 54  Temp 98.1 F (36.7 C) (Oral)  Resp 16  SpO2 100%  Physical Exam  Nursing note and vitals reviewed. Constitutional: He is oriented to person, place, and time. He appears well-developed and well-nourished.  Non-toxic appearance. No distress.  HENT:  Head: Normocephalic and atraumatic.  Eyes: Conjunctivae normal, EOM and lids are normal. Pupils are equal, round, and reactive to light.  Neck: Normal range of motion. Neck supple. No tracheal deviation present. No mass present.  Cardiovascular: Normal rate, regular rhythm and normal heart sounds.  Exam reveals no gallop.   No murmur heard. Pulmonary/Chest: Effort normal and breath sounds normal. No stridor. No respiratory distress. He  has no decreased breath sounds. He has no wheezes. He has no rhonchi. He has no rales.  Abdominal: Soft. Normal appearance and bowel sounds are normal. He exhibits no distension. There is no tenderness. There is no rebound and no CVA tenderness.  Musculoskeletal: Normal range of motion. He exhibits no edema and no tenderness.  Neurological: He is alert and oriented to person, place, and time. He has normal strength. No cranial nerve deficit or sensory deficit. GCS eye subscore is 4. GCS verbal subscore is 5. GCS motor subscore is 6.  Skin: Skin is warm and dry. No abrasion and no rash noted.  Psychiatric: He has a normal mood and affect. His speech is normal and behavior is normal.    ED Course  Procedures (including critical care time)   Labs Reviewed  CBC WITH DIFFERENTIAL  COMPREHENSIVE METABOLIC PANEL  URINALYSIS, ROUTINE W REFLEX MICROSCOPIC  PROTIME-INR  APTT   No results found.   No diagnosis found.    MDM   Date: 02/18/2012  Rate: 56  Rhythm: normal sinus rhythm  QRS Axis: left  Intervals: normal  ST/T Wave abnormalities: nonspecific T wave changes  Conduction Disutrbances:none  Narrative Interpretation:   Old EKG Reviewed: changes noted   3:48 PM Patient with likely a TIA and will be placed on a CTA protocol      Toy Baker, MD 02/18/12 1549

## 2012-02-18 NOTE — ED Notes (Signed)
Remains neuro intact with speech clear,  No facial droop, alert, oriented times three, MAE equally.  Awaiting inpatient bed.  Placed on cardiac monitor in SR rate 66

## 2012-02-18 NOTE — Progress Notes (Signed)
Chaplain responded to request from ED to meet family in consult room C. Pt's wife, son, and granddaughter were there. Wife recalled morning incident of pt having slurred speech and that EMS responded immediately. Family did not seem anxious. Pt was now better. After visiting with them a few minutes, I excused myself and went to consult room B where I had been asked to meet with another family. Pt's family expressed appreciation for the visit.

## 2012-02-18 NOTE — Progress Notes (Signed)
  Echocardiogram 2D Echocardiogram has been performed.  Gabriel Gutierrez FRANCES 02/18/2012, 5:52 PM

## 2012-02-18 NOTE — H&P (Signed)
Gabriel Gutierrez is an 76 y.o. male.  Patient was seen and examined on February 18, 2012. PCP - Dr. Brent Bulla. Cardiologist - Dr. Aviva Kluver.  Chief Complaint: Left sided facial droop and left upper extremity weakness with slurred speech. HPI: 76 year old male with history of CAD status post CABG, abdominal aortic aneurysm status post repair, hypertension, hyperlipidemia and diabetes mellitus type 2 at around 9:30 AM was witnessed by patient's wife to be having sudden onset of slurred speech with left facial droop and left upper extremity weakness and unable to walk. EMS was called immediately. Patient to the hospital. By the time patient reached the ER patient's symptoms are completely resolved. CT head was negative for anything acute. Patient is placed in observation. Subsequently MRI brain done shows infarct involving the right middle cerebral artery area. Patient was not a candidate for TPA because patient did not have any focal deficits the time patient arrived in the ER. Patient by the time I examined is able to walk lower extremity 5 x 5 and  Presently has no facial asymmetry.Denies any difficulty swallowing or speaking at this time. Denies any blurred vision or any visual symptoms. Denies any chest pain or shortness of breath.  Past Medical History  Diagnosis Date  . Diabetes mellitus Age 40  . Hyperlipidemia   . AAA (abdominal aortic aneurysm)   . Hypertension   . Coronary artery disease     post CABG x5 --Severe three-vessel coronary disease    Past Surgical History  Procedure Date  . Pr vein bypass graft,aorto-fem-pop 12/23/2005    Infrarenal abdominal aortic aneurysm, right common iliac occlusive disease --   . Colon surgery     ruptured colon  . Median sternotomy   . Hand surgery 1997  . Cataract extraction     left eye  . Skin graft 1992  . Cardiac catheterization 08/26/2005    Est. EF of 65-70% -- Severe three-vessel coronary artery disease -- very heavily  calcified vessels and really there is no role for angioplasty  -- he heavily calcified vessels and really there is no role for angioplasty abdominal aortic aneurysm surgery  . Coronary artery bypass graft 09/02/2005    x 5 -- left internal mammary artery graft to the left anterior descending coronary artery, with a saphenous vein graft to the diagonal branch of the LAD, a sequential saphenous vein graft to the second and fourth obtuse marginal branches of the left circumflex coronary artery, and a saphenous vein graft to the posterior descending branch of RCA  -- Endoscopic vein harvesting from the left leg  . Laparotomy 1998    Family History  Problem Relation Age of Onset  . Coronary artery disease Mother   . Coronary artery disease Father   . Diabetes Sister   . Coronary artery disease Sister    Social History:  reports that he quit smoking about 23 years ago. His smoking use included Cigarettes. He has a 88 pack-year smoking history. He quit smokeless tobacco use about 9 years ago. He reports that he does not drink alcohol or use illicit drugs.  Allergies: No Known Allergies   (Not in a hospital admission)  Results for orders placed during the hospital encounter of 02/18/12 (from the past 48 hour(s))  CBC WITH DIFFERENTIAL     Status: Abnormal   Collection Time   02/18/12 11:29 AM      Component Value Range Comment   WBC 11.1 (*) 4.0 - 10.5 K/uL  RBC 4.24  4.22 - 5.81 MIL/uL    Hemoglobin 12.5 (*) 13.0 - 17.0 g/dL    HCT 98.1 (*) 19.1 - 52.0 %    MCV 88.4  78.0 - 100.0 fL    MCH 29.5  26.0 - 34.0 pg    MCHC 33.3  30.0 - 36.0 g/dL    RDW 47.8  29.5 - 62.1 %    Platelets 262  150 - 400 K/uL    Neutrophils Relative 77  43 - 77 %    Neutro Abs 8.5 (*) 1.7 - 7.7 K/uL    Lymphocytes Relative 11 (*) 12 - 46 %    Lymphs Abs 1.3  0.7 - 4.0 K/uL    Monocytes Relative 9  3 - 12 %    Monocytes Absolute 1.0  0.1 - 1.0 K/uL    Eosinophils Relative 3  0 - 5 %    Eosinophils Absolute  0.3  0.0 - 0.7 K/uL    Basophils Relative 0  0 - 1 %    Basophils Absolute 0.0  0.0 - 0.1 K/uL   COMPREHENSIVE METABOLIC PANEL     Status: Abnormal   Collection Time   02/18/12 11:29 AM      Component Value Range Comment   Sodium 139  135 - 145 mEq/L    Potassium 5.2 (*) 3.5 - 5.1 mEq/L    Chloride 103  96 - 112 mEq/L    CO2 28  19 - 32 mEq/L    Glucose, Bld 106 (*) 70 - 99 mg/dL    BUN 23  6 - 23 mg/dL    Creatinine, Ser 3.08  0.50 - 1.35 mg/dL    Calcium 9.6  8.4 - 65.7 mg/dL    Total Protein 6.7  6.0 - 8.3 g/dL    Albumin 4.1  3.5 - 5.2 g/dL    AST 23  0 - 37 U/L    ALT 21  0 - 53 U/L    Alkaline Phosphatase 79  39 - 117 U/L    Total Bilirubin 0.5  0.3 - 1.2 mg/dL    GFR calc non Af Amer 53 (*) >90 mL/min    GFR calc Af Amer 62 (*) >90 mL/min   PROTIME-INR     Status: Normal   Collection Time   02/18/12 11:29 AM      Component Value Range Comment   Prothrombin Time 13.6  11.6 - 15.2 seconds    INR 1.05  0.00 - 1.49   APTT     Status: Normal   Collection Time   02/18/12 11:29 AM      Component Value Range Comment   aPTT 33  24 - 37 seconds   URINALYSIS, ROUTINE W REFLEX MICROSCOPIC     Status: Abnormal   Collection Time   02/18/12 11:42 AM      Component Value Range Comment   Color, Urine YELLOW  YELLOW    APPearance CLEAR  CLEAR    Specific Gravity, Urine 1.007  1.005 - 1.030    pH 5.5  5.0 - 8.0    Glucose, UA NEGATIVE  NEGATIVE mg/dL    Hgb urine dipstick TRACE (*) NEGATIVE    Bilirubin Urine NEGATIVE  NEGATIVE    Ketones, ur NEGATIVE  NEGATIVE mg/dL    Protein, ur NEGATIVE  NEGATIVE mg/dL    Urobilinogen, UA 0.2  0.0 - 1.0 mg/dL    Nitrite NEGATIVE  NEGATIVE    Leukocytes, UA NEGATIVE  NEGATIVE   URINE MICROSCOPIC-ADD ON     Status: Normal   Collection Time   02/18/12 11:42 AM      Component Value Range Comment   Squamous Epithelial / LPF RARE  RARE    WBC, UA 0-2  <3 WBC/hpf    RBC / HPF 0-2  <3 RBC/hpf    Bacteria, UA RARE  RARE   CBC     Status: Abnormal     Collection Time   02/18/12  4:29 PM      Component Value Range Comment   WBC 10.0  4.0 - 10.5 K/uL    RBC 4.34  4.22 - 5.81 MIL/uL    Hemoglobin 12.7 (*) 13.0 - 17.0 g/dL    HCT 72.5 (*) 36.6 - 52.0 %    MCV 88.0  78.0 - 100.0 fL    MCH 29.3  26.0 - 34.0 pg    MCHC 33.2  30.0 - 36.0 g/dL    RDW 44.0  34.7 - 42.5 %    Platelets 258  150 - 400 K/uL   COMPREHENSIVE METABOLIC PANEL     Status: Abnormal   Collection Time   02/18/12  4:29 PM      Component Value Range Comment   Sodium 141  135 - 145 mEq/L    Potassium 5.0  3.5 - 5.1 mEq/L    Chloride 104  96 - 112 mEq/L    CO2 29  19 - 32 mEq/L    Glucose, Bld 94  70 - 99 mg/dL    BUN 22  6 - 23 mg/dL    Creatinine, Ser 9.56  0.50 - 1.35 mg/dL    Calcium 9.8  8.4 - 38.7 mg/dL    Total Protein 6.9  6.0 - 8.3 g/dL    Albumin 4.2  3.5 - 5.2 g/dL    AST 26  0 - 37 U/L    ALT 22  0 - 53 U/L    Alkaline Phosphatase 79  39 - 117 U/L    Total Bilirubin 0.5  0.3 - 1.2 mg/dL    GFR calc non Af Amer 57 (*) >90 mL/min    GFR calc Af Amer 66 (*) >90 mL/min   LIPID PANEL     Status: Normal   Collection Time   02/18/12  4:29 PM      Component Value Range Comment   Cholesterol 126  0 - 200 mg/dL    Triglycerides 87  <564 mg/dL    HDL 48  >33 mg/dL    Total CHOL/HDL Ratio 2.6      VLDL 17  0 - 40 mg/dL    LDL Cholesterol 61  0 - 99 mg/dL    Ct Head Wo Contrast  02/18/2012  *RADIOLOGY REPORT*  Clinical Data: Episode of slurred speech, left-sided facial droop and left-sided weakness.  The symptoms have since resolved.  The symptoms lasted 1.5 hours.  CT HEAD WITHOUT CONTRAST  Technique:  Contiguous axial images were obtained from the base of the skull through the vertex without contrast.  Comparison: None.  Findings: No acute cortical infarct, hemorrhage, mass lesion is present.  Mild atrophy is likely within normal limits.  Scattered subcortical white matter disease is evident and distributed bilaterally.  There is no hyperdense arterial sign.  Atherosclerotic calcifications are present within the cavernous carotid arteries.  The ventricles are proportionate to the degree of atrophy.  No significant extra-axial fluid collection is present.  The paranasal sinuses and  mastoid air cells are clear.  The osseous skull is intact.  IMPRESSION:  1.  Age appropriate atrophy. 2.  Mild subcortical white matter disease likely reflects the sequelae of chronic microvascular ischemia. 3.  No acute intracranial abnormality. 4.  Atherosclerosis.   Original Report Authenticated By: Jamesetta Orleans. MATTERN, M.D.    Mr Brain Wo Contrast  02/18/2012  *RADIOLOGY REPORT*  Clinical Data:  Slurred speech.  Left facial droop.  MRI HEAD WITHOUT CONTRAST MRA HEAD WITHOUT CONTRAST  Technique:  Multiplanar, multiecho pulse sequences of the brain and surrounding structures were obtained without intravenous contrast. Angiographic images of the head were obtained using MRA technique without contrast.  Comparison:  Head CT same day  MRI HEAD  Findings:  There is an approximately 2.5 cm region of acute infarction in the upper insula to posterior frontal region on the right.  No evidence of swelling, mass effect or hemorrhage.  There is extensive chronic small vessel disease affecting the pons. The cerebral hemispheres show moderate chronic small vessel changes within the deep and subcortical white matter.  No mass lesion, hydrocephalus or extra-axial collection.  No pituitary mass.  No significant sinus disease.  No skull or skull base lesion.  IMPRESSION: Background pattern of chronic small vessel disease throughout the brain.  2.5 cm region of acute infarction in the right middle cerebral artery territory in the superior insula to posterior frontal region.  MRA HEAD  Findings: Both internal carotid arteries are patent into the brain. The anterior and middle cerebral vessels are patent proximally without flow limiting stenosis, aneurysm or vascular malformation. There is a missing  branches in the middle cerebral artery territory on the right consistent with region of infarction.  Both vertebral arteries are patent to the basilar.  No basilar stenosis.  Posterior circulation branch vessels appear normal.  IMPRESSION: No large vessel occlusion or correctable proximal stenosis. Occluded branch vessel in the right middle cerebral artery territory corresponding to the area of infarction.   Original Report Authenticated By: Thomasenia Sales, M.D.    Mr Mra Head/brain Wo Cm  02/18/2012  *RADIOLOGY REPORT*  Clinical Data:  Slurred speech.  Left facial droop.  MRI HEAD WITHOUT CONTRAST MRA HEAD WITHOUT CONTRAST  Technique:  Multiplanar, multiecho pulse sequences of the brain and surrounding structures were obtained without intravenous contrast. Angiographic images of the head were obtained using MRA technique without contrast.  Comparison:  Head CT same day  MRI HEAD  Findings:  There is an approximately 2.5 cm region of acute infarction in the upper insula to posterior frontal region on the right.  No evidence of swelling, mass effect or hemorrhage.  There is extensive chronic small vessel disease affecting the pons. The cerebral hemispheres show moderate chronic small vessel changes within the deep and subcortical white matter.  No mass lesion, hydrocephalus or extra-axial collection.  No pituitary mass.  No significant sinus disease.  No skull or skull base lesion.  IMPRESSION: Background pattern of chronic small vessel disease throughout the brain.  2.5 cm region of acute infarction in the right middle cerebral artery territory in the superior insula to posterior frontal region.  MRA HEAD  Findings: Both internal carotid arteries are patent into the brain. The anterior and middle cerebral vessels are patent proximally without flow limiting stenosis, aneurysm or vascular malformation. There is a missing branches in the middle cerebral artery territory on the right consistent with region of  infarction.  Both vertebral arteries are patent to the basilar.  No basilar stenosis.  Posterior circulation branch vessels appear normal.  IMPRESSION: No large vessel occlusion or correctable proximal stenosis. Occluded branch vessel in the right middle cerebral artery territory corresponding to the area of infarction.   Original Report Authenticated By: Thomasenia Sales, M.D.     Review of Systems  Constitutional: Negative.   HENT: Negative.   Eyes: Negative.   Respiratory: Negative.   Cardiovascular: Negative.   Gastrointestinal: Negative.   Genitourinary: Negative.   Musculoskeletal: Negative.   Skin: Negative.   Neurological:       Left facial droop and left upper extremity weakness with slurred speech.  Endo/Heme/Allergies: Negative.   Psychiatric/Behavioral: Negative.     Blood pressure 125/54, pulse 60, temperature 98.3 F (36.8 C), temperature source Oral, resp. rate 18, SpO2 95.00%. Physical Exam  Constitutional: He is oriented to person, place, and time. He appears well-developed and well-nourished. No distress.  HENT:  Head: Normocephalic and atraumatic.  Right Ear: External ear normal.  Left Ear: External ear normal.  Nose: Nose normal.  Mouth/Throat: Oropharynx is clear and moist. No oropharyngeal exudate.  Eyes: Conjunctivae normal are normal. Pupils are equal, round, and reactive to light. Right eye exhibits no discharge. Left eye exhibits no discharge. No scleral icterus.  Neck: Normal range of motion. Neck supple.  Cardiovascular: Normal rate and regular rhythm.   Respiratory: Effort normal and breath sounds normal. No respiratory distress. He has no wheezes. He has no rales.  GI: Soft. Bowel sounds are normal. He exhibits no distension. There is no tenderness. There is no rebound.  Musculoskeletal: Normal range of motion. He exhibits no edema and no tenderness.  Neurological: He is alert and oriented to person, place, and time.       Moves all extremities 5/5. No  pronator drift. No facial asymmetry. No tongue deviation.  Skin: Skin is warm and dry. He is not diaphoretic.     Assessment/Plan #1. CVA - patient has already has had MRI and MRA brain and carotid Doppler. Check 2-D echo. Place patient on neuro checks and swallow evaluation. I have consulted neurologist Dr. Thad Ranger for further recommendations. Patient's EKG shows sinus rhythm. #2. History of CAD status post CABG - presently denies any chest pain. #3. Hypertension - continue benazepril. We'll hold HCTZ for now. #4. Diabetes mellitus2 - place patient on sliding-scale coverage. #5. Hyperlipidemia - continue statins. #6. History of abdominal aortic aneurysm status post repair presently has no abdominal pain.  CODE STATUS - full code.  Eduard Clos. 02/18/2012, 10:35 PM

## 2012-02-18 NOTE — ED Provider Notes (Signed)
6:37 PM Patient with a hx sig for slurred speech and left sided weakness was placed in CDU on TIA protocol by Dr. Freida Busman. Patient care resumed from Dr. Freida Busman .  Patient is here for TIA work up and pending tests. Patient re-evaluated and is resting comfortable, VSS, with no new complaints or concerns at this time. Plan per previous provider is to discharge after tests have resulted if no TIA shown or follow up with neurology if TIA shown. On exam: hemodynamically stable, NAD, heart w/ RRR, lungs CTAB, Chest & abd non-tender, no peripheral edema or calf tenderness.   7:58 PM Patient's MRI reveals an area of infarction of right middle cerebral artery. I will call for admission. Patient admitted to hospitalist service.   Emilia Beck, PA-C 02/18/12 2212

## 2012-02-18 NOTE — Progress Notes (Signed)
VASCULAR LAB PRELIMINARY  PRELIMINARY  PRELIMINARY  PRELIMINARY  Carotid duplex completed.    Preliminary report: Mild to moderate irregular plaque bilaterally.  No signigicant ICA stenosis noted.  Lt ICA stenosis in the 60 to 79% stenosis range. Vertebral artery flow antegrade.  Gabriel Gutierrez, 02/18/2012, 6:46 PM

## 2012-02-18 NOTE — ED Notes (Signed)
Pt was up 0530 and was helping his wife move books and furniture and had sudden onset of slurred speech and left arm and left leg weakness and loss of use ems arrived and s/s had resolved at 1015 . Pt now able to move left side w/ def and no slurred speech

## 2012-02-19 ENCOUNTER — Inpatient Hospital Stay (HOSPITAL_COMMUNITY): Payer: Medicare Other

## 2012-02-19 DIAGNOSIS — I2510 Atherosclerotic heart disease of native coronary artery without angina pectoris: Secondary | ICD-10-CM | POA: Diagnosis not present

## 2012-02-19 DIAGNOSIS — E119 Type 2 diabetes mellitus without complications: Secondary | ICD-10-CM | POA: Diagnosis not present

## 2012-02-19 DIAGNOSIS — I10 Essential (primary) hypertension: Secondary | ICD-10-CM

## 2012-02-19 DIAGNOSIS — I4891 Unspecified atrial fibrillation: Secondary | ICD-10-CM

## 2012-02-19 DIAGNOSIS — I6350 Cerebral infarction due to unspecified occlusion or stenosis of unspecified cerebral artery: Secondary | ICD-10-CM | POA: Diagnosis not present

## 2012-02-19 LAB — CBC WITH DIFFERENTIAL/PLATELET
Basophils Absolute: 0 10*3/uL (ref 0.0–0.1)
Basophils Relative: 0 % (ref 0–1)
Eosinophils Absolute: 0.2 10*3/uL (ref 0.0–0.7)
Eosinophils Relative: 3 % (ref 0–5)
HCT: 37.4 % — ABNORMAL LOW (ref 39.0–52.0)
Hemoglobin: 12.1 g/dL — ABNORMAL LOW (ref 13.0–17.0)
MCH: 28.5 pg (ref 26.0–34.0)
MCHC: 32.4 g/dL (ref 30.0–36.0)
MCV: 88 fL (ref 78.0–100.0)
Monocytes Absolute: 1 10*3/uL (ref 0.1–1.0)
Monocytes Relative: 14 % — ABNORMAL HIGH (ref 3–12)
RDW: 12.9 % (ref 11.5–15.5)

## 2012-02-19 LAB — COMPREHENSIVE METABOLIC PANEL
ALT: 18 U/L (ref 0–53)
AST: 21 U/L (ref 0–37)
Albumin: 3.8 g/dL (ref 3.5–5.2)
Alkaline Phosphatase: 72 U/L (ref 39–117)
Calcium: 9.3 mg/dL (ref 8.4–10.5)
GFR calc Af Amer: 59 mL/min — ABNORMAL LOW (ref >90)
Glucose, Bld: 105 mg/dL — ABNORMAL HIGH (ref 70–99)
Potassium: 4.4 mEq/L (ref 3.5–5.1)
Sodium: 142 mEq/L (ref 135–145)
Total Protein: 6.1 g/dL (ref 6.0–8.3)

## 2012-02-19 LAB — GLUCOSE, CAPILLARY: Glucose-Capillary: 106 mg/dL — ABNORMAL HIGH (ref 70–99)

## 2012-02-19 MED ORDER — DILTIAZEM HCL 30 MG PO TABS
30.0000 mg | ORAL_TABLET | Freq: Four times a day (QID) | ORAL | Status: DC
  Administered 2012-02-19 – 2012-02-20 (×5): 30 mg via ORAL
  Filled 2012-02-19 (×7): qty 1

## 2012-02-19 MED ORDER — LABETALOL HCL 5 MG/ML IV SOLN
5.0000 mg | Freq: Once | INTRAVENOUS | Status: AC
  Administered 2012-02-19: 5 mg via INTRAVENOUS
  Filled 2012-02-19 (×2): qty 4

## 2012-02-19 MED ORDER — CLOPIDOGREL BISULFATE 75 MG PO TABS
75.0000 mg | ORAL_TABLET | Freq: Every day | ORAL | Status: DC
  Administered 2012-02-19 – 2012-02-20 (×2): 75 mg via ORAL
  Filled 2012-02-19 (×2): qty 1

## 2012-02-19 MED ORDER — DILTIAZEM LOAD VIA INFUSION
20.0000 mg | Freq: Once | INTRAVENOUS | Status: AC
  Administered 2012-02-19: 20 mg via INTRAVENOUS
  Filled 2012-02-19: qty 20

## 2012-02-19 MED ORDER — DILTIAZEM HCL 100 MG IV SOLR
5.0000 mg/h | INTRAVENOUS | Status: DC
  Administered 2012-02-19: 10 mg/h via INTRAVENOUS
  Filled 2012-02-19: qty 100

## 2012-02-19 NOTE — Consult Note (Signed)
Referring Physician: Toniann Fail    Chief Complaint: Left sided weakness and numbness  HPI: Gabriel Gutierrez is an 76 y.o. male who was at his baseline this morning and doing some work around the house.  Was noted by his wife to have slurred speech, left facial droop and LUE numbness and weakness.  EMS was called and patient was brought on for evaluation. Symptoms resolved spontaneously in 45-60 minutes while en route.  Presenting NIHSS of 0.  Patient was placed on a TIA protocol.  Echo results are pending.  Carotid dopplers show a possible 60-79% left ICA stenosis.  MRI of the brain revealed a small right MCA territory infarct (superior insula to posterior frontal region).  MRA showed no large vessel occlusion.  Patient reports that he is now at baseline.   Patient on an aspirin a day at home.    LSN: [id number] tPA Given: No: Resolution os symptoms  Past Medical History  Diagnosis Date  . Diabetes mellitus Age 65  . Hyperlipidemia   . AAA (abdominal aortic aneurysm)   . Hypertension   . Coronary artery disease     post CABG x5 --Severe three-vessel coronary disease    Past Surgical History  Procedure Date  . Pr vein bypass graft,aorto-fem-pop 12/23/2005    Infrarenal abdominal aortic aneurysm, right common iliac occlusive disease --   . Colon surgery     ruptured colon  . Median sternotomy   . Hand surgery 1997  . Cataract extraction     left eye  . Skin graft 1992  . Cardiac catheterization 08/26/2005    Est. EF of 65-70% -- Severe three-vessel coronary artery disease -- very heavily calcified vessels and really there is no role for angioplasty  -- he heavily calcified vessels and really there is no role for angioplasty abdominal aortic aneurysm surgery  . Coronary artery bypass graft 09/02/2005    x 5 -- left internal mammary artery graft to the left anterior descending coronary artery, with a saphenous vein graft to the diagonal branch of the LAD, a sequential saphenous vein graft to  the second and fourth obtuse marginal branches of the left circumflex coronary artery, and a saphenous vein graft to the posterior descending branch of RCA  -- Endoscopic vein harvesting from the left leg  . Laparotomy 1998    Family History  Problem Relation Age of Onset  . Coronary artery disease Mother   . Coronary artery disease Father   . Diabetes Sister   . Coronary artery disease Sister    Social History:  reports that he quit smoking about 23 years ago. His smoking use included Cigarettes. He has a 88 pack-year smoking history. He quit smokeless tobacco use about 9 years ago. He reports that he does not drink alcohol or use illicit drugs.  Allergies: No Known Allergies  Medications:  I have reviewed the patient's current medications. Prior to Admission:  Prescriptions prior to admission  Medication Sig Dispense Refill  . aspirin 81 MG EC tablet Take 81 mg by mouth daily.       [name] atorvastatin (LIPITOR) 80 MG tablet Take 1 tablet (80 mg total) by mouth daily.  90 tablet  3  . benazepril-hydrochlorthiazide (LOTENSIN HCT) 20-12.5 MG per tablet Take 1 tablet by mouth daily.      . fish oil-omega-3 fatty acids 1000 MG capsule Take 2 g by mouth daily.       . metFORMIN (GLUCOPHAGE) 500 MG tablet Take 500 mg by  mouth 2 (two) times daily with a meal.        . metoprolol succinate (TOPROL-XL) 25 MG 24 hr tablet Take 1 tablet (25 mg total) by mouth daily.  90 tablet  3    ROS: History obtained from patient and wife  General ROS: negative for - chills, fatigue, fever, night sweats, weight gain or weight loss Psychological ROS: unsettled since cardiac surgery Ophthalmic ROS: negative for - blurry vision, double vision, eye pain or loss of vision ENT ROS: voice change for the past week, HOH Allergy and Immunology ROS: negative for - hives or itchy/watery eyes Hematological and Lymphatic ROS: negative for - bleeding problems, bruising or swollen lymph nodes Endocrine ROS: negative for -  galactorrhea, hair pattern changes, polydipsia/polyuria or temperature intolerance Respiratory ROS: negative for - cough, hemoptysis, shortness of breath or wheezing Cardiovascular ROS: negative for - chest pain, dyspnea on exertion, edema or irregular heartbeat Gastrointestinal ROS: negative for - abdominal pain, diarrhea, hematemesis, nausea/vomiting or stool incontinence Genito-Urinary ROS: negative for - dysuria, hematuria, incontinence or urinary frequency/urgency Musculoskeletal ROS: negative for - joint swelling or muscular weakness Neurological ROS: as noted in HPI Dermatological ROS: negative for rash and skin lesion changes  Physical Examination: Blood pressure 164/41, pulse 56, temperature 97.7 F (36.5 C), temperature source Oral, resp. rate 18, SpO2 98.00%.  Neurologic Examination: Mental Status: Alert, oriented, thought content appropriate.  Speech fluent without evidence of aphasia.  Able to follow 3 step commands without difficulty. Cranial Nerves: II: Discs flat bilaterally; Visual fields grossly normal, pupils equal, round, reactive to light and accommodation Gutierrez,IV, VI: ptosis not present, extra-ocular motions intact bilaterally V,VII: smile symmetric, facial light touch sensation normal bilaterally VIII: hearing decreased bilaterally IX,X: gag reflex present XI: bilateral shoulder shrug XII: midline tongue extension Motor: Right : Upper extremity   5/5    Left:     Upper extremity   5/5  Lower extremity   5/5     Lower extremity   5/5 Tone and bulk:normal tone throughout; no atrophy noted Sensory: Pinprick and light touch intact throughout, bilaterally Deep Tendon Reflexes: 2+ and symmetric throughout Plantars: Right: equivocal   Left: equivocal Cerebellar: normal finger-to-nose and normal heel-to-shin test Gait: normal gait and station CV: pulses palpable throughout   Laboratory Studies:  Basic Metabolic Panel:  Lab 02/18/12 1610 02/18/12 1129  NA 141 139   K 5.0 5.2*  CL 104 103  CO2 29 28  GLUCOSE 94 106*  BUN 22 23  CREATININE 1.21 1.28  CALCIUM 9.8 9.6  MG -- --  PHOS -- --    Liver Function Tests:  Lab 02/18/12 1629 02/18/12 1129  AST 26 23  ALT 22 21  ALKPHOS 79 79  BILITOT 0.5 0.5  PROT 6.9 6.7  ALBUMIN 4.2 4.1   No results found for this basename: LIPASE:5,AMYLASE:5 in the last 168 hours No results found for this basename: AMMONIA:3 in the last 168 hours  CBC:  Lab 02/18/12 1629 02/18/12 1129  WBC 10.0 11.1*  NEUTROABS -- 8.5*  HGB 12.7* 12.5*  HCT 38.2* 37.5*  MCV 88.0 88.4  PLT 258 262    Cardiac Enzymes: No results found for this basename: CKTOTAL:5,CKMB:5,CKMBINDEX:5,TROPONINI:5 in the last 168 hours  BNP: No components found with this basename: POCBNP:5  CBG:  Lab 02/18/12 2359  GLUCAP 110*    Microbiology: No results found for this or any previous visit.  Coagulation Studies:  Basename 02/18/12 1129  LABPROT 13.6  INR 1.05  Urinalysis:  Lab 02/18/12 1142  COLORURINE YELLOW  LABSPEC 1.007  PHURINE 5.5  GLUCOSEU NEGATIVE  HGBUR TRACE*  BILIRUBINUR NEGATIVE  KETONESUR NEGATIVE  PROTEINUR NEGATIVE  UROBILINOGEN 0.2  NITRITE NEGATIVE  LEUKOCYTESUR NEGATIVE    Lipid Panel:    Component Value Date/Time   CHOL 126 02/18/2012 1629   TRIG 87 02/18/2012 1629   HDL 48 02/18/2012 1629   CHOLHDL 2.6 02/18/2012 1629   VLDL 17 02/18/2012 1629   LDLCALC 61 02/18/2012 1629    HgbA1C:  Lab Results  Component Value Date   HGBA1C 6.2* 02/18/2012    Urine Drug Screen:   No results found for this basename: labopia, cocainscrnur, labbenz, amphetmu, thcu, labbarb    Alcohol Level: No results found for this basename: ETH:2 in the last 168 hours  Imaging: Ct Head Wo Contrast  02/18/2012  *RADIOLOGY REPORT*  Clinical Data: Episode of slurred speech, left-sided facial droop and left-sided weakness.  The symptoms have since resolved.  The symptoms lasted 1.5 hours.  CT HEAD WITHOUT CONTRAST   Technique:  Contiguous axial images were obtained from the base of the skull through the vertex without contrast.  Comparison: None.  Findings: No acute cortical infarct, hemorrhage, mass lesion is present.  Mild atrophy is likely within normal limits.  Scattered subcortical white matter disease is evident and distributed bilaterally.  There is no hyperdense arterial sign. Atherosclerotic calcifications are present within the cavernous carotid arteries.  The ventricles are proportionate to the degree of atrophy.  No significant extra-axial fluid collection is present.  The paranasal sinuses and mastoid air cells are clear.  The osseous skull is intact.  IMPRESSION:  1.  Age appropriate atrophy. 2.  Mild subcortical white matter disease likely reflects the sequelae of chronic microvascular ischemia. 3.  No acute intracranial abnormality. 4.  Atherosclerosis.   Original Report Authenticated By: Jamesetta Orleans. MATTERN, M.D.    Mr Brain Wo Contrast  02/18/2012  *RADIOLOGY REPORT*  Clinical Data:  Slurred speech.  Left facial droop.  MRI HEAD WITHOUT CONTRAST MRA HEAD WITHOUT CONTRAST  Technique:  Multiplanar, multiecho pulse sequences of the brain and surrounding structures were obtained without intravenous contrast. Angiographic images of the head were obtained using MRA technique without contrast.  Comparison:  Head CT same day  MRI HEAD  Findings:  There is an approximately 2.5 cm region of acute infarction in the upper insula to posterior frontal region on the right.  No evidence of swelling, mass effect or hemorrhage.  There is extensive chronic small vessel disease affecting the pons. The cerebral hemispheres show moderate chronic small vessel changes within the deep and subcortical white matter.  No mass lesion, hydrocephalus or extra-axial collection.  No pituitary mass.  No significant sinus disease.  No skull or skull base lesion.  IMPRESSION: Background pattern of chronic small vessel disease throughout  the brain.  2.5 cm region of acute infarction in the right middle cerebral artery territory in the superior insula to posterior frontal region.  MRA HEAD  Findings: Both internal carotid arteries are patent into the brain. The anterior and middle cerebral vessels are patent proximally without flow limiting stenosis, aneurysm or vascular malformation. There is a missing branches in the middle cerebral artery territory on the right consistent with region of infarction.  Both vertebral arteries are patent to the basilar.  No basilar stenosis.  Posterior circulation branch vessels appear normal.  IMPRESSION: No large vessel occlusion or correctable proximal stenosis. Occluded branch vessel in the right  middle cerebral artery territory corresponding to the area of infarction.   Original Report Authenticated By: Thomasenia Sales, M.D.    Mr Mra Head/brain Wo Cm  02/18/2012  *RADIOLOGY REPORT*  Clinical Data:  Slurred speech.  Left facial droop.  MRI HEAD WITHOUT CONTRAST MRA HEAD WITHOUT CONTRAST  Technique:  Multiplanar, multiecho pulse sequences of the brain and surrounding structures were obtained without intravenous contrast. Angiographic images of the head were obtained using MRA technique without contrast.  Comparison:  Head CT same day  MRI HEAD  Findings:  There is an approximately 2.5 cm region of acute infarction in the upper insula to posterior frontal region on the right.  No evidence of swelling, mass effect or hemorrhage.  There is extensive chronic small vessel disease affecting the pons. The cerebral hemispheres show moderate chronic small vessel changes within the deep and subcortical white matter.  No mass lesion, hydrocephalus or extra-axial collection.  No pituitary mass.  No significant sinus disease.  No skull or skull base lesion.  IMPRESSION: Background pattern of chronic small vessel disease throughout the brain.  2.5 cm region of acute infarction in the right middle cerebral artery territory in  the superior insula to posterior frontal region.  MRA HEAD  Findings: Both internal carotid arteries are patent into the brain. The anterior and middle cerebral vessels are patent proximally without flow limiting stenosis, aneurysm or vascular malformation. There is a missing branches in the middle cerebral artery territory on the right consistent with region of infarction.  Both vertebral arteries are patent to the basilar.  No basilar stenosis.  Posterior circulation branch vessels appear normal.  IMPRESSION: No large vessel occlusion or correctable proximal stenosis. Occluded branch vessel in the right middle cerebral artery territory corresponding to the area of infarction.   Original Report Authenticated By: Thomasenia Sales, M.D.     Assessment: 76 y.o. male with a small right MCA territory infarct.  Presenting symptoms were those of left sided weakness and numbness and slurred speech.  All symptoms have resolved.  Lipid panel unremarkable.  A1c slightly elevated.  MRA does not support carotid doppler findings and it is unlikely that there is a significant left ICA stenosis based on MRA findings.  Although patient with multiple stroke risk factors for small vessel disease, location of acute infarct does suggest an embolic etiology.  Further work up indicated.  Patient on ASA at home.    Stroke Risk Factors - diabetes, hyperlipidemia, hypertension and CAD  Plan: 1. Echocardiogram results to be followed up 2. Prophylactic therapy-Antiplatelet med: Plavix - dose 75mg  daily.  Would discontinue ASA at this time.   3. Risk factor modification 4. Telemetry monitoring 5. Frequent neuro checks   Thana Farr, MD [facility] [phone] 02/19/2012, 1:51 AM

## 2012-02-19 NOTE — Evaluation (Signed)
Physical Therapy Evaluation Patient Details Name: Gabriel Gutierrez MRN: [id number] DOB: 14-May-1936 Today's Date: 02/19/2012 Time: 0865-7846 PT Time Calculation (min): 25 min  PT Assessment / Plan / Recommendation Clinical Impression  76 yo s/p Rt MCA CVA (small) with symptoms now resolved. Educated re: controllable risk factors and pt already doing many things to address these. Encouraged regular aerobic exercise (pt formally did this after CABG and admits he stopped). No further PT needs at this time.    PT Assessment  Patent does not need any further PT services    Follow Up Recommendations  No PT follow up    Barriers to Discharge        Equipment Recommendations  None recommended by PT    Recommendations for Other Services     Frequency      Precautions / Restrictions     Pertinent Vitals/Pain Denies pain       Mobility  Bed Mobility Bed Mobility: Supine to Sit Supine to Sit: 7: Independent;HOB flat Transfers Transfers: Sit to Stand;Stand to Sit Sit to Stand: 7: Independent;Without upper extremity assist;From bed Stand to Sit: 7: Independent;Without upper extremity assist;To bed Ambulation/Gait Ambulation/Gait Assistance: 7: Independent Ambulation Distance (Feet): 400 Feet Assistive device: None Ambulation/Gait Assistance Details: Pt performed DGI Gait Pattern: Within Functional Limits Stairs: Yes Stairs Assistance: 7: Independent Stair Management Technique: No rails;Alternating pattern;Forwards Number of Stairs: 3  Modified Rankin (Stroke Patients Only) Pre-Morbid Rankin Score: No symptoms Modified Rankin: No symptoms    Shoulder Instructions     Exercises Other Exercises Other Exercises: Reviewed importance of aerobic activity (pt went through cardiac rehab after CABG and knows he should incr his aerobic activity); encouraged walking program as he used to do   PT Diagnosis:    PT Problem List:   PT Treatment Interventions:     PT Goals    Visit  Information  Last PT Received On: 02/19/12 Assistance Needed: +1    Subjective Data  Subjective: Reports he feels back to normal Patient Stated Goal: go home today   Prior Functioning  Home Living Lives With: Spouse Available Help at Discharge: Family;Available 24 hours/day Type of Home: House Prior Function Level of Independence: Independent Able to Take Stairs?: Reciprically Driving: Yes 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; no formal exercise program;  Communication Communication: No difficulties    Cognition  Overall Cognitive Status: Appears within functional limits for tasks assessed/performed    Extremity/Trunk Assessment Right Lower Extremity Assessment RLE ROM/Strength/Tone: Within functional levels Left Lower Extremity Assessment LLE ROM/Strength/Tone: Within functional levels Trunk Assessment Trunk Assessment: Normal   Balance Balance Balance Assessed: Yes Standardized Balance Assessment Standardized Balance Assessment: Berg Balance Test;Dynamic Gait Index Berg Balance Test Sit to Stand: Able to stand without using hands and stabilize independently Standing Unsupported: Able to stand safely 2 minutes Sitting with Back Unsupported but Feet Supported on Floor or Stool: Able to sit safely and securely 2 minutes Stand to Sit: Sits safely with minimal use of hands Transfers: Able to transfer safely, minor use of hands Standing Unsupported with Eyes Closed: Able to stand 10 seconds safely Standing Ubsupported with Feet Together: Able to place feet together independently and stand 1 minute safely From Standing, Reach Forward with Outstretched Arm: Can reach confidently >25 cm (10") From Standing Position, Pick up Object from Floor: Able to pick up shoe safely and easily From Standing Position, Turn to Look Behind Over each Shoulder: Looks behind from both sides  and weight shifts well Turn 360 Degrees: Able to turn 360  degrees safely in 4 seconds or less Standing Unsupported, Alternately Place Feet on Step/Stool: Able to stand independently and safely and complete 8 steps in 20 seconds Standing Unsupported, One Foot in Front: Able to place foot tandem independently and hold 30 seconds Standing on One Leg: Able to lift leg independently and hold > 10 seconds Total Score: 56  Dynamic Gait Index Level Surface: Normal Change in Gait Speed: Normal Gait with Horizontal Head Turns: Normal Gait with Vertical Head Turns: Normal Gait and Pivot Turn: Normal Step Over Obstacle: Normal Step Around Obstacles: Normal Steps: Normal Total Score: 24   End of Session PT - End of Session Activity Tolerance: Patient tolerated treatment well Patient left: with call bell/phone within reach;with family/visitor present;Other (comment) (sit EOB) Nurse Communication: Mobility status;Other (comment) (OK to be up I'ly; d/c from PT)  GP     Kaisley Stiverson 02/19/2012, 9:43 AM Pager [phone]

## 2012-02-19 NOTE — ED Provider Notes (Signed)
Medical screening examination/treatment/procedure(s) were conducted as a shared visit with non-physician practitioner(s) and myself.  I personally evaluated the patient during the encounter  Toy Baker, MD 02/19/12 [phone]

## 2012-02-19 NOTE — Progress Notes (Signed)
Stroke Team Progress Note  HISTORY  Gabriel Gutierrez is an 76 y.o. male who was at his baseline this morning and doing some work around the house. Was noted by his wife to have slurred speech, left facial droop and LUE numbness and weakness. EMS was called and patient was brought on for evaluation. Symptoms resolved spontaneously in 45-60 minutes while en route. Presenting NIHSS of 0. Patient was placed on a TIA protocol. Echo results are pending. Carotid dopplers show a possible 60-79% left ICA stenosis. MRI of the brain revealed a small right MCA territory infarct (superior insula to posterior frontal region). MRA showed no large vessel occlusion. Patient reports that he is now at baseline.  Patient on an aspirin a day at home.   SUBJECTIVE His wife is at the bedside.  Overall he feels his condition is completely resolved.   OBJECTIVE Most recent Vital Signs: Filed Vitals:   02/19/12 0225 02/19/12 0345 02/19/12 0500 02/19/12 0530  BP: 101/69 128/38  128/80  Pulse: 55   60  Temp: 97.7 F (36.5 C) 97.6 F (36.4 C)  98.5 F (36.9 C)  TempSrc: Oral Oral  Oral  Resp: 18 18  18   Height:   5\' 6"  (1.676 m)   Weight:   76.567 kg (168 lb 12.8 oz)   SpO2: 96% 98%  98%   CBG (last 3)   Basename 02/19/12 0712 02/18/12 2359  GLUCAP 106* 110*   Intake/Output from previous day:    IV Fluid Intake:     . sodium chloride 75 mL/hr at 02/19/12 0231    MEDICATIONS    . atorvastatin  80 mg Oral Daily  . benazepril  20 mg Oral Daily  . clopidogrel  75 mg Oral Q breakfast  . enoxaparin (LOVENOX) injection  40 mg Subcutaneous Q24H  . insulin aspart  0-9 Units Subcutaneous TID WC  . metoprolol succinate  25 mg Oral Daily  . omega-3 acid ethyl esters  2 g Oral Daily  . DISCONTD: aspirin  300 mg Rectal Daily  . DISCONTD: aspirin  325 mg Oral Daily  . DISCONTD: aspirin  325 mg Oral Daily  . DISCONTD: enoxaparin  40 mg Subcutaneous Q24H   PRN:  ondansetron (ZOFRAN) IV, senna-docusate,  DISCONTD: acetaminophen  Diet:  Cardiac thin liquids Activity:  OOB with assistance DVT Prophylaxis:  lovenox  CLINICALLY SIGNIFICANT STUDIES Basic Metabolic Panel:  Lab 02/19/12 1610 02/18/12 1629  NA 142 141  K 4.4 5.0  CL 106 104  CO2 28 29  GLUCOSE 105* 94  BUN 21 22  CREATININE 1.33 1.21  CALCIUM 9.3 9.8  MG -- --  PHOS -- --   Liver Function Tests:  Lab 02/19/12 0530 02/18/12 1629  AST 21 26  ALT 18 22  ALKPHOS 72 79  BILITOT 0.4 0.5  PROT 6.1 6.9  ALBUMIN 3.8 4.2   CBC:  Lab 02/19/12 0530 02/18/12 1629 02/18/12 1129  WBC 7.0 10.0 --  NEUTROABS 4.4 -- 8.5*  HGB 12.1* 12.7* --  HCT 37.4* 38.2* --  MCV 88.0 88.0 --  PLT 250 258 --   Coagulation:  Lab 02/18/12 1129  LABPROT 13.6  INR 1.05   Cardiac Enzymes: No results found for this basename: CKTOTAL:3,CKMB:3,CKMBINDEX:3,TROPONINI:3 in the last 168 hours Urinalysis:  Lab 02/18/12 1142  COLORURINE YELLOW  LABSPEC 1.007  PHURINE 5.5  GLUCOSEU NEGATIVE  HGBUR TRACE*  BILIRUBINUR NEGATIVE  KETONESUR NEGATIVE  PROTEINUR NEGATIVE  UROBILINOGEN 0.2  NITRITE NEGATIVE  LEUKOCYTESUR  NEGATIVE   Lipid Panel    Component Value Date/Time   CHOL 126 02/18/2012 1629   TRIG 87 02/18/2012 1629   HDL 48 02/18/2012 1629   CHOLHDL 2.6 02/18/2012 1629   VLDL 17 02/18/2012 1629   LDLCALC 61 02/18/2012 1629   HgbA1C  Lab Results  Component Value Date   HGBA1C 6.2* 02/18/2012    Dg Chest 2 View 02/19/2012  IMPRESSION: No active cardiopulmonary disease.     Ct Head Wo Contrast 02/18/2012  IMPRESSION:  1.  Age appropriate atrophy. 2.  Mild subcortical white matter disease likely reflects the sequelae of chronic microvascular ischemia. 3.  No acute intracranial abnormality. 4.  Atherosclerosis.    Mr Gabriel Gutierrez Brain Wo Contrast 02/18/2012   There is an approximately 2.5 cm region of acute infarction in the upper insula to posterior frontal region on the right.  No evidence of swelling, mass effect or hemorrhage. Both internal  carotid arteries are patent into the brain. No large vessel occlusion or correctable proximal stenosis. Occluded branch vessel in the right middle cerebral artery territory corresponding to the area of infarction.       TEE:  Carotid Doppler  Mild to moderate irregular plaque bilaterally. No signigicant ICA stenosis noted. Lt ICA stenosis in the 60 to 79% stenosis range. Vertebral artery flow antegrade.   EKG  atrial fibrillation, rate RVR.   Therapy Recommendations PT - ; OT - ; ST -   Physical Exam  Pleasant middle aged Caucasian male not in distress.Awake alert. Afebrile. Head is nontraumatic. Neck is supple without bruit. Hearing is normal. Cardiac exam no murmur or gallop. Lungs are clear to auscultation. Distal pulses are well felt.  Neurological exam ;  Awake  Alert oriented x 3. Normal speech and language.eye movements full without nystagmus.Fundi not visualized. Visual acuity and fields appear normal. Face symmetric. Tongue midline. Normal strength, tone, reflexes and coordination. Normal sensation. Gait deferred.  ASSESSMENT [name] is a 76 y.o. male presenting with left facial droop and left extremity weakness. Imaging confirms an acute infarction in the upper insula to posterior frontal region on the right. Infarct felt to be embolic secondary to afib.  Work up underway. On aspirin 81 mg orally every day prior to admission. Now on clopidogrel 75 mg orally every day for secondary stroke prevention. Patient with resultant left hemiparesis.  -Right uper insula-posterior frontal infarct -Atrial fib-new by EKG, new onset per patient. On aspirin at home, placed on plavix here. Patient states that he feels a flutter from time to  time but has never been dx with rhythm problem although he see's Dr. Elease Gutierrez. Need to consider coumadin if not be taken off before as a contraindication. Recommend consulting cardiology to review past history. -TEE to look for embolic source.I will  arrange procedure. Will need to be NPO after midnight. If positive for PFO (patent foramen ovale), check bilateral lower extremity venous dopplers to rule out DVT as possible source of stroke.  -CAD -AAA -Hyperlipidemia -Diabetes Mellitus   Hospital day # 1  TREATMENT/PLAN  Continue clopidogrel 75 mg orally every day for secondary stroke prevention.   Await TEE to access for thrombus, valvular disorder which may lead to additional treatment options. I have set up.  Aggressive risk factor modification: LDL < 70 for diabetics, SBP < 130, HGB A1C , 7.0  ADDENDUM: Discussed with cardiology. They have been consulted to see patient. No TEE. They will see today. He is a patient of  Dr. Namon Cirri. Discussed with Trish.   Guy Franco, [facility],  MBA, MHA [facility] Pager: [phone] 02/19/2012 11:33 AM  Scribe for Dr. Delia Heady, Stroke Center Medical Director. He has personally reviewed chart, pertinent data, examined the patient and developed the plan of care. Pager:  [phone]

## 2012-02-19 NOTE — Progress Notes (Signed)
Pt's HR increased to the 130-145bpm, patient was asymptomatic, upon looking at the monitor, he was in atrial fibrillation, MD was notified, since patient received PO Metoprolol, we waited to see the metoprolol would help. I did a STAT EKG, it confirmed AFIB with RVR.  His HR was still elevated, MD was paged, ordered labetalol 5mg  IV and consulted cardiology.  Rapid Response nurse was rounding on the unit, she assessed the patient, cardiology came as well and assessed the patient.  Patient had new onset atrial fibrillation.  Diltiazem drip was started on unit by RR RN and patient was moved to Cardiac TELE.    During this phase, patient's overall VS were stable.  He was A/O-4 and was asymptomatic the entire duration.

## 2012-02-19 NOTE — Progress Notes (Signed)
UR COMPLETED  

## 2012-02-19 NOTE — Evaluation (Signed)
Occupational Therapy Evaluation Patient Details Name: KYRIAN STAGE MRN: [id number] DOB: January 28, 1936 Today's Date: 02/19/2012 Time: 1478-2956 OT Time Calculation (min): 12 min  OT Assessment / Plan / Recommendation Clinical Impression  Pt admitted with Right MCA CVA  and left sided weakness.  Symptoms have now resolved and pt is at baseline level.  No further acute OT needs. All education complete. Please re-order if pt experiences functional decline.    OT Assessment  Patient does not need any further OT services    Follow Up Recommendations  No OT follow up    Barriers to Discharge      Equipment Recommendations  None recommended by OT    Recommendations for Other Services    Frequency       Precautions / Restrictions     Pertinent Vitals/Pain Pt HR reached 140s during toileting tasks. Pt asymptomatic. RN aware.    ADL  Grooming: Performed;Wash/dry hands;Independent Where Assessed - Grooming: Unsupported standing Toilet Transfer: Simulated;Independent Toilet Transfer Method: Sit to Barista:  (bed) Toileting - Architect and Hygiene: Performed;Independent Where Assessed - Toileting Clothing Manipulation and Hygiene: Sit to stand from 3-in-1 or toilet Equipment Used:  (none) Transfers/Ambulation Related to ADLs: Independent ambulating throughout room ADL Comments: Pt is at baseline level (independent). Edcuated pt and wife on stroke signs and symptoms, and pt verbalized understanding.     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; no formal exercise program;  Communication Communication: No difficulties         Vision/Perception     Cognition  Overall Cognitive Status: Appears within functional limits for tasks assessed/performed Arousal/Alertness: Awake/alert Orientation Level: Appears intact for tasks assessed Behavior During Session: [facility] for tasks performed    Extremity/Trunk Assessment Right Upper Extremity Assessment RUE ROM/Strength/Tone: Within functional levels Left Upper Extremity Assessment LUE ROM/Strength/Tone: Within functional levels     Mobility Bed Mobility Bed Mobility: Supine to Sit;Sit to Supine Supine to Sit: 7: Independent Sit to Supine: 7: Independent Transfers Transfers: Sit to Stand;Stand to Sit Sit to Stand: 7: Independent Stand to Sit: 7: Independent     Shoulder Instructions     Exercise     Balance     End of Session OT - End of Session Activity Tolerance: Patient tolerated treatment well Patient left: in bed;with call bell/phone within reach;with family/visitor present Nurse Communication: Mobility status  GO   02/19/2012 Cipriano Mile OTR/L Pager [phone] Office [phone]   Cipriano Mile 02/19/2012, 3:05 PM

## 2012-02-19 NOTE — Evaluation (Signed)
Speech Language Pathology Evaluation Patient Details Name: Gabriel Gutierrez MRN: [id number] DOB: 02-04-1936 Today's Date: 02/19/2012 Time: 1884-1660 SLP Time Calculation (min): 15 min  Problem List:  Patient Active Problem List  Diagnosis  . CAD (coronary artery disease)  . HTN (hypertension)  . AAA (abdominal aortic aneurysm)  . CVA (cerebral infarction)  . Hyperlipidemia  . Diabetes mellitus  . A-fib   Past Medical History:  Past Medical History  Diagnosis Date  . Diabetes mellitus Age 25  . Hyperlipidemia   . AAA (abdominal aortic aneurysm)   . Hypertension   . Coronary artery disease     post CABG x5 --Severe three-vessel coronary disease   Past Surgical History:  Past Surgical History  Procedure Date  . Pr vein bypass graft,aorto-fem-pop 12/23/2005    Infrarenal abdominal aortic aneurysm, right common iliac occlusive disease --   . Colon surgery     ruptured colon  . Median sternotomy   . Hand surgery 1997  . Cataract extraction     left eye  . Skin graft 1992  . Cardiac catheterization 08/26/2005    Est. EF of 65-70% -- Severe three-vessel coronary artery disease -- very heavily calcified vessels and really there is no role for angioplasty  -- he heavily calcified vessels and really there is no role for angioplasty abdominal aortic aneurysm surgery  . Coronary artery bypass graft 09/02/2005    x 5 -- left internal mammary artery graft to the left anterior descending coronary artery, with a saphenous vein graft to the diagonal branch of the LAD, a sequential saphenous vein graft to the second and fourth obtuse marginal branches of the left circumflex coronary artery, and a saphenous vein graft to the posterior descending branch of RCA  -- Endoscopic vein harvesting from the left leg  . Laparotomy 1998   HPI:  Gabriel Gutierrez is an 76 y.o. male who was at his baseline this morning and doing some work around the house. Was noted by his wife to have slurred speech,  left facial droop and LUE numbness and weakness. EMS was called and patient was brought on for evaluation. Symptoms resolved spontaneously in 45-60 minutes while en route. Presenting NIHSS of 0. Patient was placed on a TIA protocol. Echo results are pending. Carotid dopplers show a possible 60-79% left ICA stenosis. MRI of the brain revealed a small right MCA territory infarct (superior insula to posterior frontal region).    Assessment / Plan / Recommendation Clinical Impression  Cognitive-linguistic function has returned to baseline and is [facility] at this time. No further SLP f/u indicated at this time. Signing off. Please reconsult as needed.     SLP Assessment  Patient does not need any further Speech Lanaguage Pathology Services    Follow Up Recommendations  None       Pertinent Vitals/Pain n/a    SLP Evaluation Prior Functioning  Cognitive/Linguistic Baseline: Within functional limits Type of Home: House Lives With: Spouse Available Help at Discharge: Family;Available 24 hours/day Vocation: Retired   IT consultant  Overall Cognitive Status: Appears within functional limits for tasks assessed Arousal/Alertness: Awake/alert Orientation Level: Oriented X4    Comprehension  Auditory Comprehension Overall Auditory Comprehension: Appears within functional limits for tasks assessed Visual Recognition/Discrimination Discrimination: Within Function Limits Reading Comprehension Reading Status: Within funtional limits    Expression Expression Primary Mode of Expression: Verbal Verbal Expression Overall Verbal Expression: Appears within functional limits for tasks assessed   Oral / Motor Oral Motor/Sensory Function Overall  Oral Motor/Sensory Function: Appears within functional limits for tasks assessed Motor Speech Overall Motor Speech: Appears within functional limits for tasks assessed   GO   Ferdinand Lango MA, CCC-SLP [phone]   Ferdinand Lango Meryl 02/19/2012, 4:04 PM

## 2012-02-19 NOTE — Consult Note (Signed)
CARDIOLOGY CONSULT NOTE    Patient ID: Gabriel Gutierrez MRN: [id number] DOB/AGE: January 17, 1936 76 y.o.  Admit date: 02/18/2012 Referring Physician:  Benjamine Mola Primary Physician: Abigail Miyamoto, MD Primary Cardiologist:  Nahser Reason for Consultation: AFib  Principal Problem:  *CVA (cerebral infarction) Active Problems:  CAD (coronary artery disease)  HTN (hypertension)  Hyperlipidemia  Diabetes mellitus   HPI:  Gabriel Gutierrez is a 75 yo gentleman with a history of coronary artery disease-status post coronary artery bypass grafting in 2007. He has a history of hypertension.  Admitted to hospital today with Gabriel Gutierrez  Telemetry shows new onset Atrial fibrillation.  No previous history of this.  Had speech difficulty this am.  Improved on way to hospital.  Not given lytic.  Unaware of palpitations.  No chest pain or dypsnea.  No contraindications to anticoagulation other than waiting  48 hours to make sure no hemorhagic transformation of stroke.  Rates have been high in the 115-120 range.  Just got 5mg  iv labatolol.     @ROS @ All other systems reviewed and negative except as noted above  Past Medical History  Diagnosis Date  . Diabetes mellitus Age 48  . Hyperlipidemia   . AAA (abdominal aortic aneurysm)   . Hypertension   . Coronary artery disease     post CABG x5 --Severe three-vessel coronary disease    Family History  Problem Relation Age of Onset  . Coronary artery disease Mother   . Coronary artery disease Father   . Diabetes Sister   . Coronary artery disease Sister     History   Social History  . Marital Status: Married    Spouse Name: N/A    Number of Children: N/A  . Years of Education: N/A   Occupational History  . Not on file.   Social History Main Topics  . Smoking status: Former Smoker -- 2.0 packs/day for 44 years    Types: Cigarettes    Quit date: 05/20/1988  . Smokeless tobacco: Former Neurosurgeon    Quit date: 02/11/2003  . Alcohol Use: No  . Drug Use: No    . Sexually Active: Not on file   Other Topics Concern  . Not on file   Social History Narrative  . No narrative on file    Past Surgical History  Procedure Date  . Pr vein bypass graft,aorto-fem-pop 12/23/2005    Infrarenal abdominal aortic aneurysm, right common iliac occlusive disease --   . Colon surgery     ruptured colon  . Median sternotomy   . Hand surgery 1997  . Cataract extraction     left eye  . Skin graft 1992  . Cardiac catheterization 08/26/2005    Est. EF of 65-70% -- Severe three-vessel coronary artery disease -- very heavily calcified vessels and really there is no role for angioplasty  -- he heavily calcified vessels and really there is no role for angioplasty abdominal aortic aneurysm surgery  . Coronary artery bypass graft 09/02/2005    x 5 -- left internal mammary artery graft to the left anterior descending coronary artery, with a saphenous vein graft to the diagonal branch of the LAD, a sequential saphenous vein graft to the second and fourth obtuse marginal branches of the left circumflex coronary artery, and a saphenous vein graft to the posterior descending branch of RCA  -- Endoscopic vein harvesting from the left leg  . Laparotomy 1998        . atorvastatin  80 mg Oral Daily  .  clopidogrel  75 mg Oral Q breakfast  . enoxaparin (LOVENOX) injection  40 mg Subcutaneous Q24H  . insulin aspart  0-9 Units Subcutaneous TID WC  . labetalol  5 mg Intravenous Once  . metoprolol succinate  25 mg Oral Daily  . omega-3 acid ethyl esters  2 g Oral Daily  . DISCONTD: aspirin  300 mg Rectal Daily  . DISCONTD: aspirin  325 mg Oral Daily  . DISCONTD: aspirin  325 mg Oral Daily  . DISCONTD: benazepril  20 mg Oral Daily  . DISCONTD: enoxaparin  40 mg Subcutaneous Q24H      . sodium chloride 75 mL/hr at 02/19/12 0231    Physical Exam:   Affect appropriate Healthy:  appears stated age HEENT: normal Neck supple with no adenopathy JVP normal no bruits no  thyromegaly Lungs clear with no wheezing and good diaphragmatic motion Heart:  S1/S2 no murmur, no rub, gallop or click PMI normal Abdomen: benighn, BS positve, no tenderness, no AAA no bruit.  No HSM or HJR Distal pulses intact with no bruits No edema Neuro non-focal at this time Skin warm and dry No muscular weakness   Labs:   Lab Results  Component Value Date   WBC 7.0 02/19/2012   HGB 12.1* 02/19/2012   HCT 37.4* 02/19/2012   MCV 88.0 02/19/2012   PLT 250 02/19/2012    Lab 02/19/12 0530  NA 142  K 4.4  CL 106  CO2 28  BUN 21  CREATININE 1.33  CALCIUM 9.3  PROT 6.1  BILITOT 0.4  ALKPHOS 72  ALT 18  AST 21  GLUCOSE 105*   No results found for this basename: CKTOTAL, CKMB, CKMBINDEX, TROPONINI    Lab Results  Component Value Date   CHOL 126 02/18/2012   Lab Results  Component Value Date   HDL 48 02/18/2012   Lab Results  Component Value Date   LDLCALC 61 02/18/2012   Lab Results  Component Value Date   TRIG 87 02/18/2012   Lab Results  Component Value Date   CHOLHDL 2.6 02/18/2012   No results found for this basename: LDLDIRECT      Radiology: Dg Chest 2 View  02/19/2012  *RADIOLOGY REPORT*  Clinical Data: Recent cerebral infarction.  Hypertension.  CHEST - 2 VIEW  Comparison: 12/24/2005.  Findings: Normal cardiac and mediastinal silhouette.  Median sternotomy.  Clear lung fields.  Degenerative change thoracic spine.  No effusion or pneumothorax.  IMPRESSION: No active cardiopulmonary disease.   Original Report Authenticated By: Elsie Stain, M.D.    Ct Head Wo Contrast  02/18/2012  *RADIOLOGY REPORT*  Clinical Data: Episode of slurred speech, left-sided facial droop and left-sided weakness.  The symptoms have since resolved.  The symptoms lasted 1.5 hours.  CT HEAD WITHOUT CONTRAST  Technique:  Contiguous axial images were obtained from the base of the skull through the vertex without contrast.  Comparison: None.  Findings: No acute cortical infarct,  hemorrhage, mass lesion is present.  Mild atrophy is likely within normal limits.  Scattered subcortical white matter disease is evident and distributed bilaterally.  There is no hyperdense arterial sign. Atherosclerotic calcifications are present within the cavernous carotid arteries.  The ventricles are proportionate to the degree of atrophy.  No significant extra-axial fluid collection is present.  The paranasal sinuses and mastoid air cells are clear.  The osseous skull is intact.  IMPRESSION:  1.  Age appropriate atrophy. 2.  Mild subcortical white matter disease likely reflects the sequelae  of chronic microvascular ischemia. 3.  No acute intracranial abnormality. 4.  Atherosclerosis.   Original Report Authenticated By: Jamesetta Orleans. MATTERN, M.D.    Mr Brain Wo Contrast  02/18/2012  *RADIOLOGY REPORT*  Clinical Data:  Slurred speech.  Left facial droop.  MRI HEAD WITHOUT CONTRAST MRA HEAD WITHOUT CONTRAST  Technique:  Multiplanar, multiecho pulse sequences of the brain and surrounding structures were obtained without intravenous contrast. Angiographic images of the head were obtained using MRA technique without contrast.  Comparison:  Head CT same day  MRI HEAD  Findings:  There is an approximately 2.5 cm region of acute infarction in the upper insula to posterior frontal region on the right.  No evidence of swelling, mass effect or hemorrhage.  There is extensive chronic small vessel disease affecting the pons. The cerebral hemispheres show moderate chronic small vessel changes within the deep and subcortical white matter.  No mass lesion, hydrocephalus or extra-axial collection.  No pituitary mass.  No significant sinus disease.  No skull or skull base lesion.  IMPRESSION: Background pattern of chronic small vessel disease throughout the brain.  2.5 cm region of acute infarction in the right middle cerebral artery territory in the superior insula to posterior frontal region.  MRA HEAD  Findings: Both  internal carotid arteries are patent into the brain. The anterior and middle cerebral vessels are patent proximally without flow limiting stenosis, aneurysm or vascular malformation. There is a missing branches in the middle cerebral artery territory on the right consistent with region of infarction.  Both vertebral arteries are patent to the basilar.  No basilar stenosis.  Posterior circulation branch vessels appear normal.  IMPRESSION: No large vessel occlusion or correctable proximal stenosis. Occluded branch vessel in the right middle cerebral artery territory corresponding to the area of infarction.   Original Report Authenticated By: Thomasenia Sales, M.D.    Mr Mra Head/brain Wo Cm  02/18/2012  *RADIOLOGY REPORT*  Clinical Data:  Slurred speech.  Left facial droop.  MRI HEAD WITHOUT CONTRAST MRA HEAD WITHOUT CONTRAST  Technique:  Multiplanar, multiecho pulse sequences of the brain and surrounding structures were obtained without intravenous contrast. Angiographic images of the head were obtained using MRA technique without contrast.  Comparison:  Head CT same day  MRI HEAD  Findings:  There is an approximately 2.5 cm region of acute infarction in the upper insula to posterior frontal region on the right.  No evidence of swelling, mass effect or hemorrhage.  There is extensive chronic small vessel disease affecting the pons. The cerebral hemispheres show moderate chronic small vessel changes within the deep and subcortical white matter.  No mass lesion, hydrocephalus or extra-axial collection.  No pituitary mass.  No significant sinus disease.  No skull or skull base lesion.  IMPRESSION: Background pattern of chronic small vessel disease throughout the brain.  2.5 cm region of acute infarction in the right middle cerebral artery territory in the superior insula to posterior frontal region.  MRA HEAD  Findings: Both internal carotid arteries are patent into the brain. The anterior and middle cerebral vessels  are patent proximally without flow limiting stenosis, aneurysm or vascular malformation. There is a missing branches in the middle cerebral artery territory on the right consistent with region of infarction.  Both vertebral arteries are patent to the basilar.  No basilar stenosis.  Posterior circulation branch vessels appear normal.  IMPRESSION: No large vessel occlusion or correctable proximal stenosis. Occluded branch vessel in the right middle cerebral artery territory  corresponding to the area of infarction.   Original Report Authenticated By: Thomasenia Sales, M.D.    Ct Angio Abd/pel W/ And/or W/o  02/11/2012  *RADIOLOGY REPORT*  Clinical Data:  AAA post EVAR in 2007  CT ANGIOGRAPHY ABDOMEN AND PELVIS WITH CONTRAST AND WITHOUT CONTRAST  Comparison: Prior CTA abdomen 01/11/2011  Findings:  VASCULAR  Aorta: Infrarenal abdominal aortic aneurysm status post endovascular aortic repair with a bifurcated endoprosthesis with suprarenal fixation.  Per the operative report, this is a [employer] stent graft. The treated aortic aneurysm continues to decrease in size measuring 30 x 22 mm in maximal diameter compared to 33 x 22 mm on the prior study.  There is no evidence of endoleak on the arterial phase, or delayed phase images.  Grafts limbs remain patent and in unchanged position.  Celiac: Widely patent.  Conventional hepatic arterial anatomy.  SMA: Trace atherosclerotic disease at the origin without significant stenosis.  Renals: Single renal arteries bilaterally.  There is at least moderate stenosis secondary to noncalcified atherosclerotic plaque at the origin of the left renal artery which has progressed since the prior study.  There is mild calcified plaque at the origin of the right renal artery without a significant underlying stenosis.  IMA: Excluded by the stent graft.  The distal branches opacify via retrograde flow.  No type 2 endoleak.  Inflow: Scattered atherosclerotic vascular calcification without  significant stenosis, dissection, or occlusion.  Aneurysmal dilatation of the proximal right hypogastric artery to 13 mm has progressed slightly from 10 mm on the prior study.  There is a moderate amount of calcified plaque along the posterior wall of the left common femoral artery.  Proximal Outflow: The visualized portions of the bilateral superficial profunda femoris arteries are patent.  Veins: Given the limitations of non venous phase timing, no focal abnormality detected.  NON-VASCULAR  Lower Chest: Visualized heart within normal limits for size.  No pericardial effusion.  Calcification versus stent in the right coronary artery.  Trace calcification of the mitral valve annulus. The distal thoracic esophagus is within normal limits.  Lung bases are clear.  Abdomen: Normal arterial phase appearance of the stomach, duodenum, spleen, adrenal glands, pancreas and liver.  There are a few calcified opacities layering within the gallbladder lumen consistent with cholelithiasis.  No secondary features to suggest acute cholecystitis.  Stable 1 cm right renal cyst.  No hydronephrosis.  No solid renal lesion.  Developing renal size discrepancy.  The left kidney is now slightly smaller than the right kidney.  Comparing to the prior study, the left kidney has decreased in size measuring 10.3 cm in length today compared to 10.8 cm on the prior study.  Normal-caliber large and small bowel throughout the abdomen.  There is diverticular disease throughout the transverse, descending and sigmoid colon without evidence of active inflammation to suggest diverticulitis.  Normal appendix identified in the right lower quadrant.  Stable right inguinal hernia containing a portion of the cecum.  No free fluid, or suspicious adenopathy.  Pelvis: Normal bladder.  Mild prostatomegaly.  The prostate measures 5.2 cm transversely.  No free fluid, or suspicious adenopathy.  Bones: No acute fracture or aggressive appearing lytic or blastic  osseous lesion. Multilevel degenerative disc disease most significant at L5-S1 again noted without significant interval progression.  Evaluation of the MIP images confirms the above findings.  IMPRESSION:  1.  Stable appearance of successfully treated infrarenal abdominal aortic aneurysm status post EVAR with a Cook Zenith bifurcated endoprosthesis.  The  excluded aneurysm sac measures slightly smaller in size compared to the prior study.  There is no evidence of endoleak.  2.  Developing moderately severe ostial stenosis of the left renal artery. Additionally, there is been an interval 0.5 cm decrease in size of the left kidney since the prior study suggesting that this stenosis may be hemodynamically significant.  3.  Mild interval progression in the size of the right internal iliac artery aneurysm which now measures up to 13 mm compared to 10 mm on the prior study.  4.  Extensive colonic diverticular disease without evidence of active diverticulitis  5.  Cholelithiasis  6.  Stable right inguinal hernia containing a portion of the cecum without evidence of incarceration.  Signed,  Sterling Big, MD Vascular & Interventional Radiologist [facility]   Original Report Authenticated By: Vilma Prader     EKG: Rapid atrial fibrillation with no acute ischemic changes rate 135   ASSESSMENT AND PLAN:  Afib:  No need for TEE  Embolic CVA clinically apparent with right branch MCA occlusion.  Control rate with iv cardizem and transition to PO beta blocker or cardizem in 24 hours.   Start  Anticoagulation when ok with neuro. Would stop Plavix given need for anticoagulation CAD:  Stable no chest pain distant CABG  No ischemic changes on ECG   Chol:  Continue statin  Discussed diagnosis and plan with patient and son Will need to be transferred to cardiac telemetry  Signed: Charlton Haws 02/19/2012, 12:46 PM

## 2012-02-19 NOTE — Care Management Note (Signed)
    Page 1 of 1   02/20/2012     4:16:50 PM   CARE MANAGEMENT NOTE 02/20/2012  Patient:  Gabriel Gutierrez, Gabriel Gutierrez   Account Number:  [id number]  Date Initiated:  02/19/2012  Documentation initiated by:  Onnie Boer  Subjective/Objective Assessment:   PT WAS ADMITTED WITH SLURRED SPEECH     Action/Plan:   PROGRESSION OF CARE AND DISCHARGE PLANNING   Anticipated DC Date:  02/21/2012   Anticipated DC Plan:        DC Planning Services  CM consult      Choice offered to / List presented to:             Status of service:  Completed, signed off Medicare Important Message given?   (If response is "NO", the following Medicare IM given date fields will be blank) Date Medicare IM given:   Date Additional Medicare IM given:    Discharge Disposition:  HOME/SELF CARE  Per UR Regulation:  Reviewed for med. necessity/level of care/duration of stay  If discussed at Long Length of Stay Meetings, dates discussed:    Comments:  02-20-12 [address]  [place], [place] [phone] CM did a benefits check for xarelto and pt gets meds via [employer]. CM did call [facility] in [place] and medication is available. CM did give pt a 10 day free card due to mail order services. MD please write for Rx 10 day free no refills and another benefits check was sent  for co pay via mail order.    02/19/12 Onnie Boer, RN, BSN 1141 PT WAS ADMITTED WITH SLURRED SPEECH, PTA PT WAS AT HOME WITH SELF/ FAMILY SUPPORT.  WILL F/U ON DC NEEDS AND RECOMMENDATIONS.

## 2012-02-19 NOTE — Progress Notes (Signed)
[facility] PROGRESS NOTE  Gabriel Gutierrez ZOX:[id number] DOB: [birth year] DOA: 02/18/2012 PCP: Abigail Miyamoto, MD  Assessment/Plan: #1. CVA - patient has already has had MRI and MRA brain:  2.5 cm region of acute infarction in the right middle  cerebral artery territory in the superior insula to posterior  frontal region. Check 2-D echo. Place patient on neuro checks and swallow evaluation. neurology following for further recommendations. Patient's EKG shows sinus rhythm.  On ASA and plavix- ? Coumadin now that a fib started  #2. History of CAD status post CABG - presently denies any chest pain.   #3. Hypertension - d/c benazepril. and HCTZ for now  #4. Diabetes mellitus2 - place patient on sliding-scale coverage.   #5. Hyperlipidemia - continue statins.   #6. History of abdominal aortic aneurysm status post repair presently has no abdominal pain . #7 New onset a fib- cardiology consulted, was on beta blocker- not sure he got a dose yesterday- given this AM-- may need cardizem gtt    Code Status: full Family Communication: son/granddaughter at bedside Disposition Plan: home when better   Consultants:  Neurology  cardiology   HPI/Subjective: No CP, no SOB Patient does not know of a history of irregular HR- a fib  Objective: Filed Vitals:   02/19/12 0500 02/19/12 0530 02/19/12 0736 02/19/12 1054  BP:  128/80 126/83 128/95  Pulse:  60 88 138  Temp:  98.5 F (36.9 C) 98.2 F (36.8 C) 98.2 F (36.8 C)  TempSrc:  Oral Oral Oral  Resp:  18 20 18   Height: 5\' 6"  (1.676 m)     Weight: 76.567 kg (168 lb 12.8 oz)     SpO2:  98% 98% 99%   No intake or output data in the 24 hours ending 02/19/12 1226 Filed Weights   02/19/12 0500  Weight: 76.567 kg (168 lb 12.8 oz)    Exam:   General:  A+Ox3, NAD  Cardiovascular: irregular and fast  Respiratory: clear anterior  Abdomen: +Bs, soft, NT/ND  Data Reviewed: Basic Metabolic Panel:  Lab 02/19/12  0530 02/18/12 1629 02/18/12 1129  NA 142 141 139  K 4.4 5.0 5.2*  CL 106 104 103  CO2 28 29 28   GLUCOSE 105* 94 106*  BUN 21 22 23   CREATININE 1.33 1.21 1.28  CALCIUM 9.3 9.8 9.6  MG -- -- --  PHOS -- -- --   Liver Function Tests:  Lab 02/19/12 0530 02/18/12 1629 02/18/12 1129  AST 21 26 23   ALT 18 22 21   ALKPHOS 72 79 79  BILITOT 0.4 0.5 0.5  PROT 6.1 6.9 6.7  ALBUMIN 3.8 4.2 4.1   No results found for this basename: LIPASE:5,AMYLASE:5 in the last 168 hours No results found for this basename: AMMONIA:5 in the last 168 hours CBC:  Lab 02/19/12 0530 02/18/12 1629 02/18/12 1129  WBC 7.0 10.0 11.1*  NEUTROABS 4.4 -- 8.5*  HGB 12.1* 12.7* 12.5*  HCT 37.4* 38.2* 37.5*  MCV 88.0 88.0 88.4  PLT 250 258 262   Cardiac Enzymes: No results found for this basename: CKTOTAL:5,CKMB:5,CKMBINDEX:5,TROPONINI:5 in the last 168 hours BNP (last 3 results) No results found for this basename: PROBNP:3 in the last 8760 hours CBG:  Lab 02/19/12 1204 02/19/12 0712 02/18/12 2359  GLUCAP 104* 106* 110*    No results found for this or any previous visit (from the past 240 hour(s)).   Studies: Dg Chest 2 View  02/19/2012  *RADIOLOGY REPORT*  Clinical Data: Recent cerebral infarction.  Hypertension.  CHEST - 2 VIEW  Comparison: 12/24/2005.  Findings: Normal cardiac and mediastinal silhouette.  Median sternotomy.  Clear lung fields.  Degenerative change thoracic spine.  No effusion or pneumothorax.  IMPRESSION: No active cardiopulmonary disease.   Original Report Authenticated By: Elsie Stain, M.D.    Ct Head Wo Contrast  02/18/2012  *RADIOLOGY REPORT*  Clinical Data: Episode of slurred speech, left-sided facial droop and left-sided weakness.  The symptoms have since resolved.  The symptoms lasted 1.5 hours.  CT HEAD WITHOUT CONTRAST  Technique:  Contiguous axial images were obtained from the base of the skull through the vertex without contrast.  Comparison: None.  Findings: No acute cortical  infarct, hemorrhage, mass lesion is present.  Mild atrophy is likely within normal limits.  Scattered subcortical white matter disease is evident and distributed bilaterally.  There is no hyperdense arterial sign. Atherosclerotic calcifications are present within the cavernous carotid arteries.  The ventricles are proportionate to the degree of atrophy.  No significant extra-axial fluid collection is present.  The paranasal sinuses and mastoid air cells are clear.  The osseous skull is intact.  IMPRESSION:  1.  Age appropriate atrophy. 2.  Mild subcortical white matter disease likely reflects the sequelae of chronic microvascular ischemia. 3.  No acute intracranial abnormality. 4.  Atherosclerosis.   Original Report Authenticated By: Jamesetta Orleans. MATTERN, M.D.    Mr Brain Wo Contrast  02/18/2012  *RADIOLOGY REPORT*  Clinical Data:  Slurred speech.  Left facial droop.  MRI HEAD WITHOUT CONTRAST MRA HEAD WITHOUT CONTRAST  Technique:  Multiplanar, multiecho pulse sequences of the brain and surrounding structures were obtained without intravenous contrast. Angiographic images of the head were obtained using MRA technique without contrast.  Comparison:  Head CT same day  MRI HEAD  Findings:  There is an approximately 2.5 cm region of acute infarction in the upper insula to posterior frontal region on the right.  No evidence of swelling, mass effect or hemorrhage.  There is extensive chronic small vessel disease affecting the pons. The cerebral hemispheres show moderate chronic small vessel changes within the deep and subcortical white matter.  No mass lesion, hydrocephalus or extra-axial collection.  No pituitary mass.  No significant sinus disease.  No skull or skull base lesion.  IMPRESSION: Background pattern of chronic small vessel disease throughout the brain.  2.5 cm region of acute infarction in the right middle cerebral artery territory in the superior insula to posterior frontal region.  MRA HEAD  Findings:  Both internal carotid arteries are patent into the brain. The anterior and middle cerebral vessels are patent proximally without flow limiting stenosis, aneurysm or vascular malformation. There is a missing branches in the middle cerebral artery territory on the right consistent with region of infarction.  Both vertebral arteries are patent to the basilar.  No basilar stenosis.  Posterior circulation branch vessels appear normal.  IMPRESSION: No large vessel occlusion or correctable proximal stenosis. Occluded branch vessel in the right middle cerebral artery territory corresponding to the area of infarction.   Original Report Authenticated By: Thomasenia Sales, M.D.    Mr Mra Head/brain Wo Cm  02/18/2012  *RADIOLOGY REPORT*  Clinical Data:  Slurred speech.  Left facial droop.  MRI HEAD WITHOUT CONTRAST MRA HEAD WITHOUT CONTRAST  Technique:  Multiplanar, multiecho pulse sequences of the brain and surrounding structures were obtained without intravenous contrast. Angiographic images of the head were obtained using MRA technique without contrast.  Comparison:  Head CT same  day  MRI HEAD  Findings:  There is an approximately 2.5 cm region of acute infarction in the upper insula to posterior frontal region on the right.  No evidence of swelling, mass effect or hemorrhage.  There is extensive chronic small vessel disease affecting the pons. The cerebral hemispheres show moderate chronic small vessel changes within the deep and subcortical white matter.  No mass lesion, hydrocephalus or extra-axial collection.  No pituitary mass.  No significant sinus disease.  No skull or skull base lesion.  IMPRESSION: Background pattern of chronic small vessel disease throughout the brain.  2.5 cm region of acute infarction in the right middle cerebral artery territory in the superior insula to posterior frontal region.  MRA HEAD  Findings: Both internal carotid arteries are patent into the brain. The anterior and middle cerebral  vessels are patent proximally without flow limiting stenosis, aneurysm or vascular malformation. There is a missing branches in the middle cerebral artery territory on the right consistent with region of infarction.  Both vertebral arteries are patent to the basilar.  No basilar stenosis.  Posterior circulation branch vessels appear normal.  IMPRESSION: No large vessel occlusion or correctable proximal stenosis. Occluded branch vessel in the right middle cerebral artery territory corresponding to the area of infarction.   Original Report Authenticated By: Thomasenia Sales, M.D.     Scheduled Meds:   . atorvastatin  80 mg Oral Daily  . benazepril  20 mg Oral Daily  . clopidogrel  75 mg Oral Q breakfast  . enoxaparin (LOVENOX) injection  40 mg Subcutaneous Q24H  . insulin aspart  0-9 Units Subcutaneous TID WC  . labetalol  5 mg Intravenous Once  . metoprolol succinate  25 mg Oral Daily  . omega-3 acid ethyl esters  2 g Oral Daily  . DISCONTD: aspirin  300 mg Rectal Daily  . DISCONTD: aspirin  325 mg Oral Daily  . DISCONTD: aspirin  325 mg Oral Daily  . DISCONTD: enoxaparin  40 mg Subcutaneous Q24H   Continuous Infusions:   . sodium chloride 75 mL/hr at 02/19/12 0231    Principal Problem:  *CVA (cerebral infarction) Active Problems:  CAD (coronary artery disease)  HTN (hypertension)  Hyperlipidemia  Diabetes mellitus    Time spent: 25    Marlin Canary  [facility] Pager [phone]  If 8PM-8AM, please contact night-coverage at [email or web address], password [facility] 02/19/2012, 12:26 PM  LOS: 1 day

## 2012-02-20 DIAGNOSIS — I10 Essential (primary) hypertension: Secondary | ICD-10-CM | POA: Diagnosis not present

## 2012-02-20 DIAGNOSIS — I714 Abdominal aortic aneurysm, without rupture: Secondary | ICD-10-CM | POA: Diagnosis not present

## 2012-02-20 DIAGNOSIS — E119 Type 2 diabetes mellitus without complications: Secondary | ICD-10-CM | POA: Diagnosis not present

## 2012-02-20 DIAGNOSIS — I2510 Atherosclerotic heart disease of native coronary artery without angina pectoris: Secondary | ICD-10-CM | POA: Diagnosis not present

## 2012-02-20 DIAGNOSIS — I6350 Cerebral infarction due to unspecified occlusion or stenosis of unspecified cerebral artery: Secondary | ICD-10-CM | POA: Diagnosis not present

## 2012-02-20 DIAGNOSIS — I4891 Unspecified atrial fibrillation: Secondary | ICD-10-CM | POA: Diagnosis not present

## 2012-02-20 LAB — GLUCOSE, CAPILLARY
Glucose-Capillary: 81 mg/dL (ref 70–99)
Glucose-Capillary: 89 mg/dL (ref 70–99)

## 2012-02-20 MED ORDER — DILTIAZEM HCL ER COATED BEADS 120 MG PO CP24: 120.0000 mg | ORAL_CAPSULE | Freq: Every day | ORAL | Status: DC

## 2012-02-20 MED ORDER — RIVAROXABAN 20 MG PO TABS: 20.0000 mg | ORAL_TABLET | Freq: Every day | ORAL | Status: DC

## 2012-02-20 MED ORDER — OFF THE BEAT BOOK
Freq: Once | Status: AC
  Administered 2012-02-20: 12:00:00
  Filled 2012-02-20 (×2): qty 1

## 2012-02-20 MED ORDER — RIVAROXABAN 20 MG PO TABS
20.0000 mg | ORAL_TABLET | Freq: Every day | ORAL | Status: DC
  Administered 2012-02-20: 20 mg via ORAL
  Filled 2012-02-20: qty 1

## 2012-02-20 MED ORDER — METOPROLOL SUCCINATE ER 50 MG PO TB24
50.0000 mg | ORAL_TABLET | Freq: Every day | ORAL | Status: DC
  Administered 2012-02-20: 50 mg via ORAL
  Filled 2012-02-20: qty 1

## 2012-02-20 MED ORDER — METOPROLOL SUCCINATE ER 50 MG PO TB24: 50.0000 mg | ORAL_TABLET | Freq: Every day | ORAL | Status: DC

## 2012-02-20 NOTE — Progress Notes (Signed)
Stroke Team Progress Note  HISTORY  Gabriel Gutierrez is an 76 y.o. male who was at his baseline this morning and doing some work around the house. Was noted by his wife to have slurred speech, left facial droop and LUE numbness and weakness. EMS was called and patient was brought on for evaluation. Symptoms resolved spontaneously in 45-60 minutes while en route. Presenting NIHSS of 0. Patient was placed on a TIA protocol. Echo results are pending. Carotid dopplers show a possible 60-79% left ICA stenosis. MRI of the brain revealed a small right MCA territory infarct (superior insula to posterior frontal region). MRA showed no large vessel occlusion. Patient reports that he is now at baseline.  Patient on an aspirin a day at home.   SUBJECTIVE His wife is at the bedside.  Overall he feels his condition is completely resolved.   OBJECTIVE Most recent Vital Signs: Filed Vitals:   02/19/12 2304 02/20/12 0000 02/20/12 0534 02/20/12 0600  BP: 124/72 123/60 114/97 104/54  Pulse:  75  64  Temp:    98.1 F (36.7 C)  TempSrc:    Oral  Resp:    20  Height:      Weight:      SpO2:    99%   CBG (last 3)   Basename 02/20/12 0757 02/19/12 2030 02/19/12 1632  GLUCAP 176* 135* 88   IV Fluid Intake:      . sodium chloride 75 mL/hr at 02/19/12 0231  . DISCONTD: diltiazem (CARDIZEM) infusion 5 mg/hr (02/19/12 1411)    MEDICATIONS     . atorvastatin  80 mg Oral Daily  . clopidogrel  75 mg Oral Q breakfast  . diltiazem  20 mg Intravenous Once  . diltiazem  30 mg Oral Q6H  . enoxaparin (LOVENOX) injection  40 mg Subcutaneous Q24H  . insulin aspart  0-9 Units Subcutaneous TID WC  . labetalol  5 mg Intravenous Once  . metoprolol succinate  50 mg Oral Daily  . omega-3 acid ethyl esters  2 g Oral Daily  . DISCONTD: benazepril  20 mg Oral Daily  . DISCONTD: metoprolol succinate  25 mg Oral Daily   PRN:  ondansetron (ZOFRAN) IV, senna-docusate  Diet:  Cardiac thin liquids Activity:  OOB with  assistance DVT Prophylaxis:  lovenox  CLINICALLY SIGNIFICANT STUDIES Basic Metabolic Panel:   Lab 02/19/12 0530 02/18/12 1629  NA 142 141  K 4.4 5.0  CL 106 104  CO2 28 29  GLUCOSE 105* 94  BUN 21 22  CREATININE 1.33 1.21  CALCIUM 9.3 9.8  MG -- --  PHOS -- --   Liver Function Tests:   Lab 02/19/12 0530 02/18/12 1629  AST 21 26  ALT 18 22  ALKPHOS 72 79  BILITOT 0.4 0.5  PROT 6.1 6.9  ALBUMIN 3.8 4.2   CBC:   Lab 02/19/12 0530 02/18/12 1629 02/18/12 1129  WBC 7.0 10.0 --  NEUTROABS 4.4 -- 8.5*  HGB 12.1* 12.7* --  HCT 37.4* 38.2* --  MCV 88.0 88.0 --  PLT 250 258 --   Coagulation:   Lab 02/18/12 1129  LABPROT 13.6  INR 1.05   Cardiac Enzymes: No results found for this basename: CKTOTAL:3,CKMB:3,CKMBINDEX:3,TROPONINI:3 in the last 168 hours Urinalysis:   Lab 02/18/12 1142  COLORURINE YELLOW  LABSPEC 1.007  PHURINE 5.5  GLUCOSEU NEGATIVE  HGBUR TRACE*  BILIRUBINUR NEGATIVE  KETONESUR NEGATIVE  PROTEINUR NEGATIVE  UROBILINOGEN 0.2  NITRITE NEGATIVE  LEUKOCYTESUR NEGATIVE   Lipid  Panel    Component Value Date/Time   CHOL 126 02/18/2012 1629   TRIG 87 02/18/2012 1629   HDL 48 02/18/2012 1629   CHOLHDL 2.6 02/18/2012 1629   VLDL 17 02/18/2012 1629   LDLCALC 61 02/18/2012 1629   HgbA1C  Lab Results  Component Value Date   HGBA1C 6.2* 02/18/2012    Dg Chest 2 View 02/19/2012  IMPRESSION: No active cardiopulmonary disease.     Ct Head Wo Contrast 02/18/2012  IMPRESSION:  1.  Age appropriate atrophy. 2.  Mild subcortical white matter disease likely reflects the sequelae of chronic microvascular ischemia. 3.  No acute intracranial abnormality. 4.  Atherosclerosis.    Mr [name] Brain Wo Contrast 02/18/2012   There is an approximately 2.5 cm region of acute infarction in the upper insula to posterior frontal region on the right.  No evidence of swelling, mass effect or hemorrhage. Both internal carotid arteries are patent into the brain. No large vessel  occlusion or correctable proximal stenosis. Occluded branch vessel in the right middle cerebral artery territory corresponding to the area of infarction.       Carotid Doppler  Mild to moderate irregular plaque bilaterally. No signigicant ICA stenosis noted. Lt ICA stenosis in the 60 to 79% stenosis range. Vertebral artery flow antegrade.   EKG  atrial fibrillation, rate RVR.   Therapy Recommendations none  Physical Exam  Pleasant middle aged Caucasian male not in distress.Awake alert. Afebrile. Head is nontraumatic. Neck is supple without bruit. Hearing is normal. Cardiac exam no murmur or gallop. Lungs are clear to auscultation. Distal pulses are well felt.  Neurological exam ;  Awake  Alert oriented x 3. Normal speech and language.eye movements full without nystagmus.Fundi not visualized. Visual acuity and fields appear normal. Face symmetric. Tongue midline. Normal strength, tone, reflexes and coordination. Normal sensation. Gait deferred.   ASSESSMENT Gabriel Gutierrez is a 76 y.o. male presenting with left facial droop and left extremity weakness. Imaging confirms an acute infarction in the upper insula to posterior frontal region on the right. Infarct felt to be embolic secondary to afib.  Work up completed. On aspirin 81 mg orally every day prior to admission. Now on clopidogrel 75 mg orally every day for secondary stroke prevention. Patient with resultant left hemiparesis.  -Right uper insula-posterior frontal infarct -Atrial fib-new by EKG, new onset per patient. On aspirin at home, placed on plavix here. Patient states that he feels a flutter from time to  time but has never been dx with rhythm problem although he see's Dr. Elease Gutierrez. Agree with plan for anticoagulation. -CAD -AAA -Hyperlipidemia -Diabetes Mellitus   Hospital day # 2  TREATMENT/PLAN  Change to Xarelto  for secondary stroke prevention.   Ongoing risk factor control  No therapy needs  Ok for discharge from  neuro standpoint Stroke Service will sign off. Follow up with Dr. Pearlean Gutierrez, Stroke Clinic, in 2 months.  Annie Main, MSN, RN, ANVP-BC, ANP-BC, GNP-BC [facility] Pager: [phone] 02/20/2012 10:06 AM  Scribe for Dr. Delia Heady, Stroke Center Medical Director, who has personally reviewed chart, pertinent data, examined the patient and developed the plan of care. Pager:  [phone]

## 2012-02-20 NOTE — Discharge Summary (Signed)
Physician Discharge Summary  Gabriel Gutierrez ZOX:[id number] DOB: 05-Jun-1935 DOA: 02/18/2012  PCP: Abigail Miyamoto, MD  Admit date: 02/18/2012 Discharge date: 02/20/2012  Recommendations for Outpatient Follow-up:  1. Followup with Dr. Marina Goodell, PCP. 2. Followup with Dr. Kristeen Miss, cardiology in 2 weeks from hospital discharge. 3. Followup with Dr. Delia Heady, neurology in 2 months from hospital discharge.  Discharge Diagnoses:  Principal Problem:  *CVA (cerebral infarction) Active Problems:  CAD (coronary artery disease)  HTN (hypertension)  Hyperlipidemia  Diabetes mellitus  A-fib   Discharge Condition: Improved and stable.  Diet recommendation: Heart healthy and diabetic  Filed Weights   02/19/12 0500  Weight: 76.567 kg (168 lb 12.8 oz)    History of present illness:  Chief Complaint: Left sided facial droop and left upper extremity weakness with slurred speech.  HPI: 76 year old male with history of CAD status post CABG, abdominal aortic aneurysm status post repair, hypertension, hyperlipidemia and diabetes mellitus type 2 at around 9:30 AM on 10/2 was witnessed by patient's wife to be having sudden onset of slurred speech with left facial droop and left upper extremity weakness and unable to walk. EMS was called immediately. Patient to the hospital. By the time patient reached the ER patient's symptoms are completely resolved. CT head was negative for anything acute. Patient is placed in observation. Subsequently MRI brain done shows infarct involving the right middle cerebral artery area. Patient was not a candidate for TPA because patient did not have any focal deficits the time patient arrived in the ER.   Hospital Course:  1. Right uper insula-posterior frontal acute infarct, likely embolic secondary to A. fib: Patient was admitted to telemetry. Neurology and cardiology consulted. Patient was on aspirin at home prior to admission. Briefly she was placed on Plavix  he had however given history of atrial fibrillation, aspirin and Plavix were both discontinued and patient was started on Xarelto of which she has received the first dose. Discussed with cardiology who do not see a need for TEE to look for embolic source since patient will be on anticoagulation anyway. 2. Atrial fibrillation: Likely paroxysmal since patient has had prior symptoms. Cardiology consulted and increased his dose of Toprol-XL and added Cardizem. Xarelto has been started. Cardiology has cleared her for discharge and will follow him as an outpatient. 3. Hypertension: Controlled. Continue home dose of ACE inhibitors and HCTZ , Toprol-XL increased. Low dose Cardizem CD added. Monitor as outpatient. 4. DM 2: Continue home metformin. 5. Hyperlipidemia: Continue Lipitor. 6. CAD: Stable. 7. AAA: outpatient followup with vascular surgery. 6 years post insertion aortobiiliac Cook Zenith stent graft for a 5 cm abdominal aortic aneurysm    Procedures:  CT head.  MRI and MRA brain without contrast.  Carotid Dopplers.  2-D echocardiogram.  Consultations:  Neurology  Cardiology  Discharge Exam:  Complaints Patient denies any complaints.  Filed Vitals:   02/20/12 0000 02/20/12 0534 02/20/12 0600 02/20/12 1415  BP: 123/60 114/97 104/54 112/60  Pulse: 75  64 75  Temp:   98.1 F (36.7 C) 98.4 F (36.9 C)  TempSrc:   Oral Oral  Resp:   20 18  Height:      Weight:      SpO2:   99% 98%     General exam: Comfortable and in no obvious distress.  Respiratory system: Clear to auscultation. No increased work of breathing.  Cardiovascular system: S1 and S2 heard, regular rate and rhythm. No JVD, murmurs or gallops. Telemetry shows rate controlled  A. fib.  Gastrointestinal system: Abdomen is non-, soft and nontender. Normal bowel sounds heard.  Central system: Alert and oriented. No focal neurological deficits.  Extremities: Symmetric 5 x 5 power.  Discharge Instructions       Discharge Orders    Future Appointments: Provider: Department: Dept Phone: Center:   03/13/2012 2:00 PM Vesta Mixer, MD [facility] [phone] [facility]   02/08/2013 8:30 AM [facility] [phone] [facility]   02/08/2013 9:00 AM Evern Bio, NP [facility] [phone] [facility]     Future Orders Please Complete By Expires   Diet - low sodium heart healthy      Diet Carb Modified      Increase activity slowly      Call MD for:  difficulty breathing, headache or visual disturbances      Call MD for:  persistant dizziness or light-headedness      Call MD for:  extreme fatigue          Medication List     As of 02/20/2012  3:01 PM    STOP taking these medications         aspirin 81 MG EC tablet      TAKE these medications         atorvastatin 80 MG tablet   Commonly known as: LIPITOR   Take 1 tablet (80 mg total) by mouth daily.      benazepril-hydrochlorthiazide 20-12.5 MG per tablet   Commonly known as: LOTENSIN HCT   Take 1 tablet by mouth daily.      diltiazem 120 MG 24 hr capsule   Commonly known as: CARDIZEM CD   Take 1 capsule (120 mg total) by mouth daily.      fish oil-omega-3 fatty acids 1000 MG capsule   Take 2 g by mouth daily.      metFORMIN 500 MG tablet   Commonly known as: GLUCOPHAGE   Take 500 mg by mouth 2 (two) times daily with a meal.      metoprolol succinate 50 MG 24 hr tablet   Commonly known as: TOPROL-XL   Take 1 tablet (50 mg total) by mouth daily. Take with or immediately following a meal.      Rivaroxaban 20 MG Tabs   Commonly known as: XARELTO   Take 1 tablet (20 mg total) by mouth daily with supper.         Follow-up Information    Schedule an appointment as soon as possible for a visit with Abigail Miyamoto, MD.   Contact information:   [address] [place] [address]       Follow up with Elyn Aquas., MD. Schedule an appointment as soon as possible for a visit in 2 weeks.   Contact  information:   [address] [place] [address] [phone]       Follow up with Gates Rigg, MD. Schedule an appointment as soon as possible for a visit in 2 months.   Contact information:   [address], [address] [facility] [place] [address] [phone]           The results of significant diagnostics from this hospitalization (including imaging, microbiology, ancillary and laboratory) are listed below for reference.    Significant Diagnostic Studies: Dg Chest 2 View  02/19/2012  *RADIOLOGY REPORT*  Clinical Data: Recent cerebral infarction.  Hypertension.  CHEST - 2 VIEW  Comparison: 12/24/2005.  Findings: Normal cardiac and mediastinal silhouette.  Median sternotomy.  Clear lung fields.  Degenerative change thoracic spine.  No effusion or pneumothorax.  IMPRESSION: No active cardiopulmonary disease.   Original Report Authenticated By: Elsie Stain, M.D.    Ct Head Wo Contrast  02/18/2012  *RADIOLOGY REPORT*  Clinical Data: Episode of slurred speech, left-sided facial droop and left-sided weakness.  The symptoms have since resolved.  The symptoms lasted 1.5 hours.  CT HEAD WITHOUT CONTRAST  Technique:  Contiguous axial images were obtained from the base of the skull through the vertex without contrast.  Comparison: None.  Findings: No acute cortical infarct, hemorrhage, mass lesion is present.  Mild atrophy is likely within normal limits.  Scattered subcortical white matter disease is evident and distributed bilaterally.  There is no hyperdense arterial sign. Atherosclerotic calcifications are present within the cavernous carotid arteries.  The ventricles are proportionate to the degree of atrophy.  No significant extra-axial fluid collection is present.  The paranasal sinuses and mastoid air cells are clear.  The osseous skull is intact.  IMPRESSION:  1.  Age appropriate atrophy. 2.  Mild subcortical white matter disease likely reflects  the sequelae of chronic microvascular ischemia. 3.  No acute intracranial abnormality. 4.  Atherosclerosis.   Original Report Authenticated By: Jamesetta Orleans. MATTERN, M.D.    Mr Brain Wo Contrast  02/18/2012  *RADIOLOGY REPORT*  Clinical Data:  Slurred speech.  Left facial droop.  MRI HEAD WITHOUT CONTRAST MRA HEAD WITHOUT CONTRAST  Technique:  Multiplanar, multiecho pulse sequences of the brain and surrounding structures were obtained without intravenous contrast. Angiographic images of the head were obtained using MRA technique without contrast.  Comparison:  Head CT same day  MRI HEAD  Findings:  There is an approximately 2.5 cm region of acute infarction in the upper insula to posterior frontal region on the right.  No evidence of swelling, mass effect or hemorrhage.  There is extensive chronic small vessel disease affecting the pons. The cerebral hemispheres show moderate chronic small vessel changes within the deep and subcortical white matter.  No mass lesion, hydrocephalus or extra-axial collection.  No pituitary mass.  No significant sinus disease.  No skull or skull base lesion.  IMPRESSION: Background pattern of chronic small vessel disease throughout the brain.  2.5 cm region of acute infarction in the right middle cerebral artery territory in the superior insula to posterior frontal region.  MRA HEAD  Findings: Both internal carotid arteries are patent into the brain. The anterior and middle cerebral vessels are patent proximally without flow limiting stenosis, aneurysm or vascular malformation. There is a missing branches in the middle cerebral artery territory on the right consistent with region of infarction.  Both vertebral arteries are patent to the basilar.  No basilar stenosis.  Posterior circulation branch vessels appear normal.  IMPRESSION: No large vessel occlusion or correctable proximal stenosis. Occluded branch vessel in the right middle cerebral artery territory corresponding to the  area of infarction.   Original Report Authenticated By: Thomasenia Sales, M.D.      Ct Angio Abd/pel W/ And/or W/o  02/11/2012  *RADIOLOGY REPORT*  Clinical Data:  AAA post EVAR in 2007  CT ANGIOGRAPHY ABDOMEN AND PELVIS WITH CONTRAST AND WITHOUT CONTRAST  Comparison: Prior CTA abdomen 01/11/2011  Findings:  VASCULAR  Aorta: Infrarenal abdominal aortic aneurysm status post endovascular aortic repair with a bifurcated endoprosthesis with suprarenal fixation.  Per the operative report, this is a [employer] stent graft. The treated aortic aneurysm continues to decrease in size  measuring 30 x 22 mm in maximal diameter compared to 33 x 22 mm on the prior study.  There is no evidence of endoleak on the arterial phase, or delayed phase images.  Grafts limbs remain patent and in unchanged position.  Celiac: Widely patent.  Conventional hepatic arterial anatomy.  SMA: Trace atherosclerotic disease at the origin without significant stenosis.  Renals: Single renal arteries bilaterally.  There is at least moderate stenosis secondary to noncalcified atherosclerotic plaque at the origin of the left renal artery which has progressed since the prior study.  There is mild calcified plaque at the origin of the right renal artery without a significant underlying stenosis.  IMA: Excluded by the stent graft.  The distal branches opacify via retrograde flow.  No type 2 endoleak.  Inflow: Scattered atherosclerotic vascular calcification without significant stenosis, dissection, or occlusion.  Aneurysmal dilatation of the proximal right hypogastric artery to 13 mm has progressed slightly from 10 mm on the prior study.  There is a moderate amount of calcified plaque along the posterior wall of the left common femoral artery.  Proximal Outflow: The visualized portions of the bilateral superficial profunda femoris arteries are patent.  Veins: Given the limitations of non venous phase timing, no focal abnormality detected.  NON-VASCULAR   Lower Chest: Visualized heart within normal limits for size.  No pericardial effusion.  Calcification versus stent in the right coronary artery.  Trace calcification of the mitral valve annulus. The distal thoracic esophagus is within normal limits.  Lung bases are clear.  Abdomen: Normal arterial phase appearance of the stomach, duodenum, spleen, adrenal glands, pancreas and liver.  There are a few calcified opacities layering within the gallbladder lumen consistent with cholelithiasis.  No secondary features to suggest acute cholecystitis.  Stable 1 cm right renal cyst.  No hydronephrosis.  No solid renal lesion.  Developing renal size discrepancy.  The left kidney is now slightly smaller than the right kidney.  Comparing to the prior study, the left kidney has decreased in size measuring 10.3 cm in length today compared to 10.8 cm on the prior study.  Normal-caliber large and small bowel throughout the abdomen.  There is diverticular disease throughout the transverse, descending and sigmoid colon without evidence of active inflammation to suggest diverticulitis.  Normal appendix identified in the right lower quadrant.  Stable right inguinal hernia containing a portion of the cecum.  No free fluid, or suspicious adenopathy.  Pelvis: Normal bladder.  Mild prostatomegaly.  The prostate measures 5.2 cm transversely.  No free fluid, or suspicious adenopathy.  Bones: No acute fracture or aggressive appearing lytic or blastic osseous lesion. Multilevel degenerative disc disease most significant at L5-S1 again noted without significant interval progression.  Evaluation of the MIP images confirms the above findings.  IMPRESSION:  1.  Stable appearance of successfully treated infrarenal abdominal aortic aneurysm status post EVAR with a Cook Zenith bifurcated endoprosthesis.  The excluded aneurysm sac measures slightly smaller in size compared to the prior study.  There is no evidence of endoleak.  2.  Developing moderately  severe ostial stenosis of the left renal artery. Additionally, there is been an interval 0.5 cm decrease in size of the left kidney since the prior study suggesting that this stenosis may be hemodynamically significant.  3.  Mild interval progression in the size of the right internal iliac artery aneurysm which now measures up to 13 mm compared to 10 mm on the prior study.  4.  Extensive colonic diverticular disease without  evidence of active diverticulitis  5.  Cholelithiasis  6.  Stable right inguinal hernia containing a portion of the cecum without evidence of incarceration.  Signed,  Sterling Big, MD Vascular & Interventional Radiologist [facility]   Original Report Authenticated By: Vilma Prader    2-D echocardiogram  Study Conclusions  - Left ventricle: The cavity size was normal. Wall thickness was increased in a pattern of mild LVH. Systolic function was normal. The estimated ejection fraction was in the range of 60% to 65%. - Aortic valve: Trivial regurgitation. - Mitral valve: Mild prolapse, involving the posterior leaflet. Mild regurgitation. - Pulmonary arteries: PA peak pressure: 33mm Hg (S).    Microbiology: No results found for this or any previous visit (from the past 240 hour(s)).   Labs: Basic Metabolic Panel:  Lab 02/19/12 1610 02/18/12 1629 02/18/12 1129  NA 142 141 139  K 4.4 5.0 5.2*  CL 106 104 103  CO2 28 29 28   GLUCOSE 105* 94 106*  BUN 21 22 23   CREATININE 1.33 1.21 1.28  CALCIUM 9.3 9.8 9.6  MG -- -- --  PHOS -- -- --   Liver Function Tests:  Lab 02/19/12 0530 02/18/12 1629 02/18/12 1129  AST 21 26 23   ALT 18 22 21   ALKPHOS 72 79 79  BILITOT 0.4 0.5 0.5  PROT 6.1 6.9 6.7  ALBUMIN 3.8 4.2 4.1   No results found for this basename: LIPASE:5,AMYLASE:5 in the last 168 hours No results found for this basename: AMMONIA:5 in the last 168 hours CBC:  Lab 02/19/12 0530 02/18/12 1629 02/18/12 1129  WBC 7.0 10.0 11.1*  NEUTROABS 4.4 -- 8.5*    HGB 12.1* 12.7* 12.5*  HCT 37.4* 38.2* 37.5*  MCV 88.0 88.0 88.4  PLT 250 258 262   Cardiac Enzymes: No results found for this basename: CKTOTAL:5,CKMB:5,CKMBINDEX:5,TROPONINI:5 in the last 168 hours BNP: BNP (last 3 results) No results found for this basename: PROBNP:3 in the last 8760 hours CBG:  Lab 02/20/12 1135 02/20/12 0757 02/19/12 2030 02/19/12 1632 02/19/12 1204  GLUCAP 81 176* 135* 88 104*   Other lab data: 1. Lipid panel: Cholesterol 126, triglycerides 87, HDL 48, LDL 61, VLDL 17. 2. Hemoglobin A1c: 6.2. 3. TSH: 3.520  Time coordinating discharge: Less than 30 minutes  Signed:  HONGALGI,ANAND  [facility] 02/20/2012, 3:01 PM

## 2012-02-20 NOTE — Progress Notes (Signed)
Patient had a 1.77 second pause @ 2356. And a 2.07 second pause @ 0021. Patient was asymptomatic and resting. BP was 108/55, HR 68. Patient on RA, and afebrile. Dr. Tresa Endo on call with [facility] Cardiology notified and made aware. And also Internal Medicine Doctor on call made aware. Told to notify cardiology if patient has a pause greater than 3.5 seconds. Will continue to monitor patient.

## 2012-02-20 NOTE — Progress Notes (Signed)
PROGRESS NOTE  Subjective:   Gabriel Gutierrez is a 76 yo with hx of CAD, HTN, AAA admitted with a CVA and found to have new onset A-Fib. He is doing better.  His neuro status is stable.   Objective:    Vital Signs:   Temp:  [97.6 F (36.4 C)-98.2 F (36.8 C)] 98.1 F (36.7 C) (10/03 0600) Pulse Rate:  [45-155] 64  (10/03 0600) Resp:  [18-20] 20  (10/03 0600) BP: (104-175)/(53-97) 104/54 mmHg (10/03 0600) SpO2:  [96 %-99 %] 99 % (10/03 0600)  Last BM Date: 02/18/12   24-hour weight change: Weight change:   Weight trends: Filed Weights   02/19/12 0500  Weight: 168 lb 12.8 oz (76.567 kg)    Intake/Output:        Physical Exam: BP 104/54  Pulse 64  Temp 98.1 F (36.7 C) (Oral)  Resp 20  Ht 5\' 6"  (1.676 m)  Wt 168 lb 12.8 oz (76.567 kg)  BMI 27.24 kg/m2  SpO2 99%  General: Vital signs reviewed and noted. Well-developed, well-nourished, in no acute distress; alert, appropriate and cooperative .  Head: Normocephalic, atraumatic.  Eyes: conjunctivae/corneas clear.  EOM's intact.   Throat: normal  Neck: Supple. Normal carotids. No JVD  Lungs:  Clear to auscultation  Heart: Irregularly Irregular,  With normal  S1 S2. No murmurs, gallops or rubs  Abdomen:  Soft, non-tender, non-distended with normoactive bowel sounds. No hepatomegaly. No rebound/guarding. No abdominal masses.  Extremities: Distal pedal pulses are 2+ .  No edema.    Neurologic: A&O X3, CN II - XII are grossly intact. Motor strength is 5/5 in the all 4 extremities.  Psych: Responds to questions appropriately with normal affect.    Labs: BMET:  Basename 02/19/12 0530 02/18/12 1629  NA 142 141  K 4.4 5.0  CL 106 104  CO2 28 29  GLUCOSE 105* 94  BUN 21 22  CREATININE 1.33 1.21  CALCIUM 9.3 9.8  MG -- --  PHOS -- --    Liver function tests:  Basename 02/19/12 0530 02/18/12 1629  AST 21 26  ALT 18 22  ALKPHOS 72 79  BILITOT 0.4 0.5  PROT 6.1 6.9  ALBUMIN 3.8 4.2   No results found for this  basename: LIPASE:2,AMYLASE:2 in the last 72 hours  CBC:  Basename 02/19/12 0530 02/18/12 1629 02/18/12 1129  WBC 7.0 10.0 --  NEUTROABS 4.4 -- 8.5*  HGB 12.1* 12.7* --  HCT 37.4* 38.2* --  MCV 88.0 88.0 --  PLT 250 258 --    Cardiac Enzymes: No results found for this basename: CKTOTAL:4,CKMB:4,TROPONINI:4 in the last 72 hours  Coagulation Studies:  Basename 02/18/12 1129  LABPROT 13.6  INR 1.05    Other: No components found with this basename: POCBNP:3 No results found for this basename: DDIMER in the last 72 hours  Basename 02/18/12 1629  HGBA1C 6.2*    Basename 02/18/12 1629  CHOL 126  HDL 48  LDLCALC 61  TRIG 87  CHOLHDL 2.6    Basename 02/19/12 0530  TSH 3.520  T4TOTAL --  T3FREE --  THYROIDAB --   No results found for this basename: VITAMINB12,FOLATE,FERRITIN,TIBC,IRON,RETICCTPCT in the last 72 hours   Tele:  A-Fib with controlled V response.  Medications:    Infusions:    . sodium chloride 75 mL/hr at 02/19/12 0231  . DISCONTD: diltiazem (CARDIZEM) infusion 5 mg/hr (02/19/12 1411)    Scheduled Medications:    . atorvastatin  80 mg Oral Daily  .  clopidogrel  75 mg Oral Q breakfast  . diltiazem  20 mg Intravenous Once  . diltiazem  30 mg Oral Q6H  . enoxaparin (LOVENOX) injection  40 mg Subcutaneous Q24H  . insulin aspart  0-9 Units Subcutaneous TID WC  . labetalol  5 mg Intravenous Once  . metoprolol succinate  25 mg Oral Daily  . omega-3 acid ethyl esters  2 g Oral Daily  . DISCONTD: benazepril  20 mg Oral Daily    Assessment/ Plan:    CVA (cerebral infarction) (02/18/2012) Due to A-fib.  I would start him on Xarelto 20 a day whenever neuro gives the OK. DC Plavix once on xarelto.  CAD (coronary artery disease) (03/20/2011) Stable  HTN (hypertension) (03/20/2011)  Hyperlipidemia (02/18/2012) Cont. lipitor  Diabetes mellitus (02/18/2012)  A-fib (02/19/2012)   Increase metoprolol to 50 QD, add dilt CD if needed for additional  rate control.  I will make adjustments as OP.   Disposition: DC to home when ok with Int. Medicine. Length of Stay: 2  Vesta Mixer, Montez Hageman., MD, [facility] 02/20/2012, 8:23 AM Office [phone] Pager [phone]

## 2012-02-21 ENCOUNTER — Other Ambulatory Visit: Payer: Self-pay | Admitting: Cardiology

## 2012-02-21 ENCOUNTER — Other Ambulatory Visit: Payer: Self-pay | Admitting: *Deleted

## 2012-02-21 DIAGNOSIS — I10 Essential (primary) hypertension: Secondary | ICD-10-CM

## 2012-02-21 DIAGNOSIS — E785 Hyperlipidemia, unspecified: Secondary | ICD-10-CM

## 2012-02-21 MED ORDER — METOPROLOL SUCCINATE ER 25 MG PO TB24: 25.0000 mg | ORAL_TABLET | Freq: Every day | ORAL | Status: DC

## 2012-02-25 ENCOUNTER — Telehealth: Payer: Self-pay | Admitting: Cardiovascular Disease

## 2012-02-25 ENCOUNTER — Encounter: Payer: Self-pay | Admitting: *Deleted

## 2012-02-25 DIAGNOSIS — I4891 Unspecified atrial fibrillation: Secondary | ICD-10-CM

## 2012-02-25 DIAGNOSIS — I639 Cerebral infarction, unspecified: Secondary | ICD-10-CM

## 2012-02-25 MED ORDER — DILTIAZEM HCL ER COATED BEADS 120 MG PO CP24: 120.0000 mg | ORAL_CAPSULE | Freq: Every day | ORAL | Status: DC

## 2012-02-25 MED ORDER — RIVAROXABAN 20 MG PO TABS: 20.0000 mg | ORAL_TABLET | Freq: Every day | ORAL | Status: DC

## 2012-02-25 NOTE — Telephone Encounter (Signed)
New Problem:    Patient called in needing a refill of his metoprolol succinate (TOPROL-XL) 25 MG 24 hr tablet and Rivaroxaban (XARELTO) 20 MG TABS.

## 2012-02-25 NOTE — Telephone Encounter (Signed)
Gave samples of xarelto to get him through.other med orders placed and pt informed

## 2012-03-03 DIAGNOSIS — E119 Type 2 diabetes mellitus without complications: Secondary | ICD-10-CM | POA: Diagnosis not present

## 2012-03-03 DIAGNOSIS — I10 Essential (primary) hypertension: Secondary | ICD-10-CM | POA: Diagnosis not present

## 2012-03-03 DIAGNOSIS — I4891 Unspecified atrial fibrillation: Secondary | ICD-10-CM | POA: Diagnosis not present

## 2012-03-03 DIAGNOSIS — E7800 Pure hypercholesterolemia, unspecified: Secondary | ICD-10-CM | POA: Diagnosis not present

## 2012-03-03 DIAGNOSIS — M1710 Unilateral primary osteoarthritis, unspecified knee: Secondary | ICD-10-CM | POA: Diagnosis not present

## 2012-03-13 ENCOUNTER — Encounter: Payer: Self-pay | Admitting: Cardiovascular Disease

## 2012-03-13 ENCOUNTER — Ambulatory Visit (INDEPENDENT_AMBULATORY_CARE_PROVIDER_SITE_OTHER): Payer: Medicare Other | Admitting: Cardiovascular Disease

## 2012-03-13 VITALS — BP 112/70 | HR 57 | Resp 18 | Ht 66.0 in | Wt 168.8 lb

## 2012-03-13 DIAGNOSIS — I2510 Atherosclerotic heart disease of native coronary artery without angina pectoris: Secondary | ICD-10-CM | POA: Diagnosis not present

## 2012-03-13 DIAGNOSIS — I10 Essential (primary) hypertension: Secondary | ICD-10-CM

## 2012-03-13 DIAGNOSIS — E785 Hyperlipidemia, unspecified: Secondary | ICD-10-CM

## 2012-03-13 DIAGNOSIS — I4891 Unspecified atrial fibrillation: Secondary | ICD-10-CM | POA: Diagnosis not present

## 2012-03-13 MED ORDER — ATORVASTATIN CALCIUM 80 MG PO TABS: 80.0000 mg | ORAL_TABLET | Freq: Every day | ORAL | Status: DC

## 2012-03-13 NOTE — Progress Notes (Signed)
Gabriel Gutierrez Date of Birth  March 02, 1936 [facility] [address]    [address] [place], [place]  [address] [phone]  Fax  [phone]  Problem list: 1. Coronary artery disease-status post CABG 2. Abdominal aortic aneurysm-Status post stent grafting 3. Diabetes mellitus 4. Dyslipidemia 5. Hypertension 6. Atrial fibrillation 7. History of stroke  History of Present Illness:  Gabriel Gutierrez is a 76 yo gentleman with a history of coronary artery disease-status post coronary artery bypass grafting in 2007. He has a history of hypertension.   He remains very active. 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    He was recently admitted to the hospital with an acute stroke. He was also found to have rapid atrial fibrillation. He stroke symptoms resolve fairly quickly and he does not have much of the deficit. He remains in atrial fibrillation.  He's been maintained on Xarelto.  He's completely asymptomatic from a cardiac standpoint. He cannot tell that his heart rate is beating irregular rate.   Current Outpatient Prescriptions on File Prior to Visit  Medication Sig Dispense Refill  . benazepril-hydrochlorthiazide (LOTENSIN HCT) 20-12.5 MG per tablet Take 1 tablet by mouth daily.      [name] diltiazem (CARDIZEM CD) 120 MG 24 hr capsule Take 1 capsule (120 mg total) by mouth daily.  90 capsule  3  . fish oil-omega-3 fatty acids 1000 MG capsule Take 2 g by mouth daily.       . metFORMIN (GLUCOPHAGE) 500 MG tablet Take 500 mg by mouth 2 (two) times daily with a meal.        . metoprolol succinate (TOPROL-XL) 25 MG 24 hr tablet Take 1 tablet (25 mg total) by mouth daily.  90 tablet  1  . Rivaroxaban (XARELTO) 20 MG TABS Take 1 tablet (20 mg total) by mouth daily with supper.  90 tablet  3  . DISCONTD: atorvastatin (LIPITOR) 80 MG tablet Take 1 tablet (80 mg total) by mouth daily.  90  tablet  3    No Known Allergies  Past Medical History  Diagnosis Date  . Diabetes mellitus Age 4  . Hyperlipidemia   . AAA (abdominal aortic aneurysm)   . Hypertension   . Coronary artery disease     post CABG x5 --Severe three-vessel coronary disease    Past Surgical History  Procedure Date  . Pr vein bypass graft,aorto-fem-pop 12/23/2005    Infrarenal abdominal aortic aneurysm, right common iliac occlusive disease --   . Colon surgery     ruptured colon  . Median sternotomy   . Hand surgery 1997  . Cataract extraction     left eye  . Skin graft 1992  . Cardiac catheterization 08/26/2005    Est. EF of 65-70% -- Severe three-vessel coronary artery disease -- very heavily calcified vessels and really there is no role for angioplasty  -- he heavily calcified vessels and really there is no role for angioplasty abdominal aortic aneurysm surgery  . Coronary artery bypass graft 09/02/2005    x 5 -- left internal mammary artery graft to the left anterior descending coronary artery, with a saphenous vein graft to the diagonal branch of the LAD, a sequential saphenous vein graft to the second and fourth obtuse marginal branches of the left circumflex coronary artery, and a saphenous vein graft to the posterior descending branch of  RCA  -- Endoscopic vein harvesting from the left leg  . Laparotomy 1998    History  Smoking status  . Former Smoker -- 2.0 packs/day for 44 years  . Types: Cigarettes  . Quit date: 05/20/1988  Smokeless tobacco  . Former Neurosurgeon  . Quit date: 02/11/2003    History  Alcohol Use No    Family History  Problem Relation Age of Onset  . Coronary artery disease Mother   . Coronary artery disease Father   . Diabetes Sister   . Coronary artery disease Sister     Reviw of Systems:  Reviewed in the HPI.  All other systems are negative.  Physical Exam: BP 112/70  Pulse 57  Resp 18  Ht 5\' 6"  (1.676 m)  Wt 168 lb 12.8 oz (76.567 kg)  BMI 27.24 kg/m2   SpO2 97% The patient is alert and oriented x 3.  The mood and affect are normal.   Skin: warm and dry.  Color is normal.    HEENT:   [place]/AT, no JVD, normal carotids  Lungs: clear   Heart: RR, no murmurs    Abdomen: good BS, non tender  Extremities:  No  C/c/e  Neuro:  Non focal    ECG:  Assessment / Plan:

## 2012-03-13 NOTE — Patient Instructions (Addendum)
Your physician recommends that you schedule a follow-up appointment in: 1 MONTH WITH EKG   Your physician has recommended you make the following change in your medication:   DECREASE METOPROLOL TO 25MG  DAILY

## 2012-03-13 NOTE — Assessment & Plan Note (Signed)
His A-fib rate is slow.  We will decrease his toprol XL dose to 25 mg a day.  I  ( or Lawson Fiscal) will see him in 1 month to decide about doing a cardioversion.  I think we should try cardioversion at least 1 time.  He is asymptomatic so I do not think we need to try endlessly to get him into NSR.

## 2012-03-13 NOTE — Assessment & Plan Note (Signed)
Doing well. No angina.  Continue current meds

## 2012-04-06 ENCOUNTER — Other Ambulatory Visit: Payer: Self-pay | Admitting: *Deleted

## 2012-04-06 NOTE — Telephone Encounter (Signed)
Opened in Error.

## 2012-04-14 ENCOUNTER — Encounter: Payer: Self-pay | Admitting: Cardiovascular Disease

## 2012-04-14 ENCOUNTER — Ambulatory Visit (INDEPENDENT_AMBULATORY_CARE_PROVIDER_SITE_OTHER): Payer: Medicare Other | Admitting: Cardiovascular Disease

## 2012-04-14 ENCOUNTER — Encounter: Payer: Self-pay | Admitting: *Deleted

## 2012-04-14 VITALS — BP 130/64 | HR 59 | Ht 66.0 in | Wt 167.8 lb

## 2012-04-14 DIAGNOSIS — Z01818 Encounter for other preprocedural examination: Secondary | ICD-10-CM

## 2012-04-14 DIAGNOSIS — I4891 Unspecified atrial fibrillation: Secondary | ICD-10-CM

## 2012-04-14 LAB — CBC WITH DIFFERENTIAL/PLATELET
Basophils Relative: 0.3 % (ref 0.0–3.0)
Eosinophils Relative: 1.9 % (ref 0.0–5.0)
Lymphocytes Relative: 14 % (ref 12.0–46.0)
MCV: 89.9 fl (ref 78.0–100.0)
Monocytes Absolute: 0.8 10*3/uL (ref 0.1–1.0)
Monocytes Relative: 9.5 % (ref 3.0–12.0)
Neutrophils Relative %: 74.3 % (ref 43.0–77.0)
Platelets: 265 10*3/uL (ref 150.0–400.0)
RBC: 4.35 Mil/uL (ref 4.22–5.81)
WBC: 8.5 10*3/uL (ref 4.5–10.5)

## 2012-04-14 LAB — BASIC METABOLIC PANEL
Chloride: 105 mEq/L (ref 96–112)
Creatinine, Ser: 1.5 mg/dL (ref 0.4–1.5)
GFR: 47.13 mL/min — ABNORMAL LOW (ref >60.00)

## 2012-04-14 NOTE — Patient Instructions (Addendum)
YOU HAVE BEEN SCHEDULED FOR A CARDIOVERSION 04/15/12 @ 12 NOON SEE LETTER AND FOLLOW INSTRUCTIONS.   Your physician recommends that you schedule a follow-up appointment in: 1 MONTH  Your physician recommends that you return for lab work in: TODAY BMET CBC

## 2012-04-14 NOTE — Progress Notes (Signed)
Gabriel Gutierrez Date of Birth  10-25-1935 [facility] [address]    [address] [place], [place]  [address] [phone]  Fax  [phone]  Problem list: 1. Coronary artery disease-status post CABG 2. Abdominal aortic aneurysm-Status post stent grafting 3. Diabetes mellitus 4. Dyslipidemia 5. Hypertension 6. Atrial fibrillation 7. History of stroke  History of Present Illness:  Gabriel Gutierrez is a 76 yo gentleman with a history of coronary artery disease-status post coronary artery bypass grafting in 2007. He has a history of hypertension.   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    He was recently admitted to the hospital with an acute stroke. He was also found to have rapid atrial fibrillation. He stroke symptoms resolve fairly quickly and he does not have much of the deficit. He remains in atrial fibrillation.  He's been maintained on Xarelto.  He's completely asymptomatic from a cardiac standpoint. He cannot tell that his heart rate is beating irregularly.  April 14, 2012:   Current Outpatient Prescriptions on File Prior to Visit  Medication Sig Dispense Refill  . atorvastatin (LIPITOR) 80 MG tablet Take 1 tablet (80 mg total) by mouth daily.  90 tablet  3  . benazepril-hydrochlorthiazide (LOTENSIN HCT) 20-12.5 MG per tablet Take 1 tablet by mouth daily.      [name] diltiazem (CARDIZEM CD) 120 MG 24 hr capsule Take 1 capsule (120 mg total) by mouth daily.  90 capsule  3  . fish oil-omega-3 fatty acids 1000 MG capsule Take 2 g by mouth daily.       . metFORMIN (GLUCOPHAGE) 500 MG tablet Take 500 mg by mouth 2 (two) times daily with a meal.        . metoprolol succinate (TOPROL-XL) 25 MG 24 hr tablet Take 1 tablet (25 mg total) by mouth daily.  90 tablet  1  . Rivaroxaban (XARELTO) 20 MG TABS Take 1 tablet (20 mg total) by mouth daily with supper.  90  tablet  3    No Known Allergies  Past Medical History  Diagnosis Date  . Diabetes mellitus Age 35  . Hyperlipidemia   . AAA (abdominal aortic aneurysm)   . Hypertension   . Coronary artery disease     post CABG x5 --Severe three-vessel coronary disease    Past Surgical History  Procedure Date  . Pr vein bypass graft,aorto-fem-pop 12/23/2005    Infrarenal abdominal aortic aneurysm, right common iliac occlusive disease --   . Colon surgery     ruptured colon  . Median sternotomy   . Hand surgery 1997  . Cataract extraction     left eye  . Skin graft 1992  . Cardiac catheterization 08/26/2005    Est. EF of 65-70% -- Severe three-vessel coronary artery disease -- very heavily calcified vessels and really there is no role for angioplasty  -- he heavily calcified vessels and really there is no role for angioplasty abdominal aortic aneurysm surgery  . Coronary artery bypass graft 09/02/2005    x 5 -- left internal mammary artery graft to the left anterior descending coronary artery, with a saphenous vein graft to the diagonal branch of the LAD, a sequential saphenous vein graft to the second and fourth obtuse marginal branches of the left circumflex coronary artery, and a saphenous vein graft to the posterior descending branch of  RCA  -- Endoscopic vein harvesting from the left leg  . Laparotomy 1998    History  Smoking status  . Former Smoker -- 2.0 packs/day for 44 years  . Types: Cigarettes  . Quit date: 05/20/1988  Smokeless tobacco  . Former Neurosurgeon  . Quit date: 02/11/2003    History  Alcohol Use No    Family History  Problem Relation Age of Onset  . Coronary artery disease Mother   . Coronary artery disease Father   . Diabetes Sister   . Coronary artery disease Sister     Reviw of Systems:  Reviewed in the HPI.  All other systems are negative.  Physical Exam: BP 130/64  Pulse 59  Ht 5\' 6"  (1.676 m)  Wt 167 lb 12.8 oz (76.114 kg)  BMI 27.08 kg/m2 The  patient is alert and oriented x 3.  The mood and affect are normal.   Skin: warm and dry.  Color is normal.    HEENT:   [place]/AT, no JVD, normal carotids  Lungs: clear   Heart: RR, no murmurs    Abdomen: good BS, non tender  Extremities:  No  C/c/e  Neuro:  Non focal    ECG: 04/14/2012-atrial fibrillation at 70 beats per minute. nonspecific ST and T wave abnormalities. Assessment / Plan:

## 2012-04-14 NOTE — Assessment & Plan Note (Signed)
Gabriel Gutierrez has a recent diagnosis of atrial fibrillation. He has been on Xarelto for the past month or so.  His rate is well controlled and he is basically asymptomatic. He is interested in cardioversion. We'll set him up to have a cardioversion this week.  We'll get pre-cardioversion labs including a CBC and a basic metabolic profile. He does not need a protime because he has been on Xarelto.    If she does not maintain sinus rhythm I do not think that he is interested in starting any further antiarrhythmic medications. He's completely asymptomatic. He is able to do all of his normal activities without problems.

## 2012-04-15 ENCOUNTER — Encounter (HOSPITAL_COMMUNITY): Payer: Self-pay | Admitting: *Deleted

## 2012-04-15 ENCOUNTER — Encounter (HOSPITAL_COMMUNITY)
Admission: RE | Disposition: A | Discharge status: Home / Self Care | Payer: Self-pay | Source: Ambulatory Visit | Attending: Cardiovascular Disease

## 2012-04-15 ENCOUNTER — Ambulatory Visit (HOSPITAL_COMMUNITY): Payer: Medicare Other | Admitting: Certified Registered Nurse Anesthetist

## 2012-04-15 ENCOUNTER — Encounter (HOSPITAL_COMMUNITY): Payer: Self-pay | Admitting: Certified Registered Nurse Anesthetist

## 2012-04-15 ENCOUNTER — Ambulatory Visit (HOSPITAL_COMMUNITY)
Admission: RE | Admit: 2012-04-15 | Discharge: 2012-04-15 | Disposition: A | Discharge status: Home / Self Care | Payer: Medicare Other | Source: Ambulatory Visit | Attending: Cardiovascular Disease | Admitting: Cardiovascular Disease

## 2012-04-15 DIAGNOSIS — I4891 Unspecified atrial fibrillation: Secondary | ICD-10-CM | POA: Diagnosis not present

## 2012-04-15 DIAGNOSIS — E119 Type 2 diabetes mellitus without complications: Secondary | ICD-10-CM | POA: Insufficient documentation

## 2012-04-15 DIAGNOSIS — I10 Essential (primary) hypertension: Secondary | ICD-10-CM | POA: Diagnosis not present

## 2012-04-15 HISTORY — PX: CARDIOVERSION: SHX1299

## 2012-04-15 HISTORY — DX: Unspecified osteoarthritis, unspecified site: M19.90

## 2012-04-15 LAB — GLUCOSE, CAPILLARY: Glucose-Capillary: 95 mg/dL (ref 70–99)

## 2012-04-15 SURGERY — CARDIOVERSION
Anesthesia: General | Wound class: Clean

## 2012-04-15 MED ORDER — SODIUM CHLORIDE 0.9 % IV SOLN
INTRAVENOUS | Status: DC
  Administered 2012-04-15: 500 mL via INTRAVENOUS

## 2012-04-15 MED ORDER — PROPOFOL 10 MG/ML IV BOLUS
INTRAVENOUS | Status: DC | PRN
  Administered 2012-04-15: 140 mg via INTRAVENOUS

## 2012-04-15 NOTE — H&P (Signed)
Date of Initial H&P: 04/14/12  History reviewed, patient examined, no change in status, stable for cardioversion.

## 2012-04-15 NOTE — Anesthesia Preprocedure Evaluation (Addendum)
Anesthesia Evaluation  Patient identified by MRN, date of birth, ID band Patient awake    Reviewed: Allergy & Precautions, H&P , NPO status , Patient's Chart, lab work & pertinent test results, reviewed documented beta blocker date and time   History of Anesthesia Complications Negative for: history of anesthetic complications  Airway Mallampati: II TM Distance: >3 FB Neck ROM: full    Dental  (+) Dental Advisory Given, Partial Upper and Partial Lower   Pulmonary neg pulmonary ROS,  breath sounds clear to auscultation        Cardiovascular hypertension, On Medications and On Home Beta Blockers + CAD, + CABG and + Peripheral Vascular Disease + dysrhythmias Atrial Fibrillation Rhythm:Irregular     Neuro/Psych CVA negative psych ROS   GI/Hepatic negative GI ROS, Neg liver ROS,   Endo/Other  diabetes  Renal/GU negative Renal ROS  negative genitourinary   Musculoskeletal   Abdominal   Peds  Hematology negative hematology ROS (+)   Anesthesia Other Findings See surgeon's H&P   Reproductive/Obstetrics negative OB ROS                         Anesthesia Physical Anesthesia Plan  ASA: III  Anesthesia Plan: General   Post-op Pain Management:    Induction: Intravenous  Airway Management Planned: Mask  Additional Equipment:   Intra-op Plan:   Post-operative Plan:   Informed Consent: I have reviewed the patients History and Physical, chart, labs and discussed the procedure including the risks, benefits and alternatives for the proposed anesthesia with the patient or authorized representative who has indicated his/her understanding and acceptance.   Dental Advisory Given  Plan Discussed with: CRNA and Surgeon  Anesthesia Plan Comments:         Anesthesia Quick Evaluation

## 2012-04-15 NOTE — Preoperative (Signed)
Beta Blockers   Reason not to administer Beta Blockers:Not Applicable 

## 2012-04-15 NOTE — CV Procedure (Signed)
Electrical Cardioversion Procedure Note Gabriel Gutierrez [id number] Jan 07, 1936  Procedure: Electrical Cardioversion Indications:  Atrial Fibrillation  Time Out: Verified patient identification, verified procedure,medications/allergies/relevent history reviewed, required imaging and test results available.  Performed  Procedure Details  The patient was NPO after midnight. Anesthesia was administered at the beside  by Dr.  Gelene Mink with 140mg  of propofol.  Cardioversion was done with synchronized biphasic defibrillation with AP pads with 120Joules.  The patient converted to normal sinus rhythm. The patient tolerated the procedure well   IMPRESSION:  Successful cardioversion to NSR.  Continue Xarelto.  F/u with Dr. Elease Hashimoto.    Gabriel Springfield S. 04/15/2012, 12:19 PM

## 2012-04-15 NOTE — Transfer of Care (Signed)
Immediate Anesthesia Transfer of Care Note  Patient: Gabriel Gutierrez  Procedure(s) Performed: Procedure(s) (LRB) with comments: CARDIOVERSION (N/A)  Patient Location: Endoscopy Unit  Anesthesia Type:General  Level of Consciousness: awake, alert  and oriented  Airway & Oxygen Therapy: Patient Spontanous Breathing  Post-op Assessment: Report given to PACU RN, Post -op Vital signs reviewed and stable and Patient moving all extremities  Post vital signs: Reviewed and stable  Complications: No apparent anesthesia complications

## 2012-04-15 NOTE — Anesthesia Postprocedure Evaluation (Signed)
  Anesthesia Post-op Note  Patient: Gabriel Gutierrez  Procedure(s) Performed: Procedure(s) (LRB) with comments: CARDIOVERSION (N/A)  Patient Location: Endoscopy Unit  Anesthesia Type:General  Level of Consciousness: awake, alert  and oriented  Airway and Oxygen Therapy: Patient Spontanous Breathing  Post-op Pain: none  Post-op Assessment: Post-op Vital signs reviewed, Patient's Cardiovascular Status Stable, Respiratory Function Stable, Patent Airway and No signs of Nausea or vomiting  Post-op Vital Signs: Reviewed and stable  Complications: No apparent anesthesia complications

## 2012-04-20 ENCOUNTER — Encounter (HOSPITAL_COMMUNITY): Payer: Self-pay | Admitting: Cardiovascular Disease

## 2012-05-07 ENCOUNTER — Encounter: Payer: Self-pay | Admitting: Cardiovascular Disease

## 2012-05-07 ENCOUNTER — Ambulatory Visit (INDEPENDENT_AMBULATORY_CARE_PROVIDER_SITE_OTHER): Payer: Medicare Other | Admitting: Cardiovascular Disease

## 2012-05-07 VITALS — BP 150/84 | HR 72 | Ht 66.0 in | Wt 171.0 lb

## 2012-05-07 DIAGNOSIS — I714 Abdominal aortic aneurysm, without rupture: Secondary | ICD-10-CM | POA: Diagnosis not present

## 2012-05-07 DIAGNOSIS — I4891 Unspecified atrial fibrillation: Secondary | ICD-10-CM

## 2012-05-07 DIAGNOSIS — I10 Essential (primary) hypertension: Secondary | ICD-10-CM | POA: Diagnosis not present

## 2012-05-07 MED ORDER — METOPROLOL SUCCINATE ER 50 MG PO TB24: 50.0000 mg | ORAL_TABLET | Freq: Every day | ORAL | Status: DC

## 2012-05-07 NOTE — Patient Instructions (Addendum)
Increase your Toprol-XL to 50 mg a day Decrease her salt intake.  Return to see me in 3 months for an office visit and EKG.

## 2012-05-07 NOTE — Assessment & Plan Note (Signed)
His heart rate is still a little fast. We'll increase his metoprolol to 50 mg a day. This will also help with his mild hypertension. I'll see him again in 3 months for an office visit and EKG.

## 2012-05-07 NOTE — Assessment & Plan Note (Signed)
He still has a mildly elevated blood pressure. He admits to eating a little bit of extra salt. We'll have her restrict his salt intake. We'll also increase his metoprolol which will help his hypertension slightly. Return office visit in 3 months.

## 2012-05-07 NOTE — Progress Notes (Signed)
Gabriel Gutierrez Date of Birth  January 22, 1936 [facility] [address]    [address] [place], [place]  [address] [phone]  Fax  [phone]  Problem list: 1. Coronary artery disease-status post CABG 2. Abdominal aortic aneurysm-Status post stent grafting 3. Diabetes mellitus 4. Dyslipidemia 5. Hypertension 6. Atrial fibrillation 7. History of stroke  History of Present Illness:  Gabriel Gutierrez is a 76 yo gentleman with a history of coronary artery disease-status post coronary artery bypass grafting in 2007. He has a history of hypertension.   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    He was recently admitted to the hospital with an acute stroke. He was also found to have rapid atrial fibrillation. He stroke symptoms resolve fairly quickly and he does not have much of the deficit. He remains in atrial fibrillation.  He's been maintained on Xarelto.  He's completely asymptomatic from a cardiac standpoint. He cannot tell that his heart rate is beating irregularly.  Dec. 19, 2013 He had a cardioversion performed last month by Dr. Eldridge Dace who was kind enough to perform the cardioversion after I got tied up in the office. He is completely asymptomatic. He cannot tell whether his heart is in atrial fibrillation or normal sinus rhythm. He has been able to do all of his normal activities without difficulty.  Current Outpatient Prescriptions on File Prior to Visit  Medication Sig Dispense Refill  . atorvastatin (LIPITOR) 80 MG tablet Take 1 tablet (80 mg total) by mouth daily.  90 tablet  3  . benazepril-hydrochlorthiazide (LOTENSIN HCT) 20-12.5 MG per tablet Take 1 tablet by mouth daily.      [name] diltiazem (CARDIZEM CD) 120 MG 24 hr capsule Take 1 capsule (120 mg total) by mouth daily.  90 capsule  3  . fish oil-omega-3 fatty acids 1000 MG capsule Take 2 g by mouth  daily.       . metFORMIN (GLUCOPHAGE) 500 MG tablet Take 500 mg by mouth 2 (two) times daily with a meal.        . metoprolol succinate (TOPROL-XL) 25 MG 24 hr tablet Take 1 tablet (25 mg total) by mouth daily.  90 tablet  1  . Rivaroxaban (XARELTO) 20 MG TABS Take 1 tablet (20 mg total) by mouth daily with supper.  90 tablet  3    No Known Allergies  Past Medical History  Diagnosis Date  . Diabetes mellitus Age 66  . Hyperlipidemia   . AAA (abdominal aortic aneurysm)   . Hypertension   . Coronary artery disease     post CABG x5 --Severe three-vessel coronary disease  . Arthritis     Past Surgical History  Procedure Date  . Pr vein bypass graft,aorto-fem-pop 12/23/2005    Infrarenal abdominal aortic aneurysm, right common iliac occlusive disease --   . Colon surgery     ruptured colon  . Median sternotomy   . Hand surgery 1997  . Cataract extraction     left eye  . Skin graft 1992  . Cardiac catheterization 08/26/2005    Est. EF of 65-70% -- Severe three-vessel coronary artery disease -- very heavily calcified vessels and really there is no role for angioplasty  -- he heavily calcified vessels and really there is no role for angioplasty abdominal aortic aneurysm surgery  . Coronary artery bypass graft 09/02/2005  x 5 -- left internal mammary artery graft to the left anterior descending coronary artery, with a saphenous vein graft to the diagonal branch of the LAD, a sequential saphenous vein graft to the second and fourth obtuse marginal branches of the left circumflex coronary artery, and a saphenous vein graft to the posterior descending branch of RCA  -- Endoscopic vein harvesting from the left leg  . Laparotomy 1998  . Cardioversion 04/15/2012    Procedure: CARDIOVERSION;  Surgeon: Vesta Mixer, MD;  Location: [facility] ENDOSCOPY;  Service: Cardiovascular;  Laterality: N/A;    History  Smoking status  . Former Smoker -- 2.0 packs/day for 44 years  . Types: Cigarettes  .  Quit date: 05/20/1988  Smokeless tobacco  . Former Neurosurgeon  . Quit date: 02/11/2003    History  Alcohol Use No    Family History  Problem Relation Age of Onset  . Coronary artery disease Mother   . Coronary artery disease Father   . Diabetes Sister   . Coronary artery disease Sister     Reviw of Systems:  Reviewed in the HPI.  All other systems are negative.  Physical Exam: BP 150/84  Pulse 72  Ht 5\' 6"  (1.676 m)  Wt 171 lb (77.565 kg)  BMI 27.60 kg/m2 The patient is alert and oriented x 3.  The mood and affect are normal.   Skin: warm and dry.  Color is normal.    HEENT:   [place]/AT, no JVD, normal carotids  Lungs: clear   Heart: RR, no murmurs    Abdomen: good BS, non tender  Extremities:  No  C/c/e  Neuro:  Non focal    ECG: 05/07/12 -atrial fibrillation at 70 beats per minute. nonspecific ST and T wave abnormalities. Assessment / Plan:

## 2012-06-19 ENCOUNTER — Encounter: Payer: Self-pay | Admitting: Cardiovascular Disease

## 2012-06-19 DIAGNOSIS — E119 Type 2 diabetes mellitus without complications: Secondary | ICD-10-CM | POA: Diagnosis not present

## 2012-06-19 DIAGNOSIS — I10 Essential (primary) hypertension: Secondary | ICD-10-CM | POA: Diagnosis not present

## 2012-06-19 DIAGNOSIS — E7800 Pure hypercholesterolemia, unspecified: Secondary | ICD-10-CM | POA: Diagnosis not present

## 2012-06-30 DIAGNOSIS — I10 Essential (primary) hypertension: Secondary | ICD-10-CM | POA: Diagnosis not present

## 2012-06-30 DIAGNOSIS — E119 Type 2 diabetes mellitus without complications: Secondary | ICD-10-CM | POA: Diagnosis not present

## 2012-06-30 DIAGNOSIS — E785 Hyperlipidemia, unspecified: Secondary | ICD-10-CM | POA: Diagnosis not present

## 2012-06-30 DIAGNOSIS — E875 Hyperkalemia: Secondary | ICD-10-CM | POA: Diagnosis not present

## 2012-06-30 DIAGNOSIS — E7800 Pure hypercholesterolemia, unspecified: Secondary | ICD-10-CM | POA: Diagnosis not present

## 2012-07-04 ENCOUNTER — Other Ambulatory Visit: Payer: Self-pay

## 2012-07-16 DIAGNOSIS — E876 Hypokalemia: Secondary | ICD-10-CM | POA: Diagnosis not present

## 2012-07-16 DIAGNOSIS — Z7901 Long term (current) use of anticoagulants: Secondary | ICD-10-CM | POA: Diagnosis not present

## 2012-08-03 DIAGNOSIS — E875 Hyperkalemia: Secondary | ICD-10-CM | POA: Diagnosis not present

## 2012-08-03 DIAGNOSIS — Z7901 Long term (current) use of anticoagulants: Secondary | ICD-10-CM | POA: Diagnosis not present

## 2012-08-10 ENCOUNTER — Encounter: Payer: Self-pay | Admitting: Cardiovascular Disease

## 2012-08-10 ENCOUNTER — Ambulatory Visit (INDEPENDENT_AMBULATORY_CARE_PROVIDER_SITE_OTHER): Payer: Medicare Other | Admitting: Cardiovascular Disease

## 2012-08-10 VITALS — BP 130/72 | HR 87 | Ht 66.0 in | Wt 169.0 lb

## 2012-08-10 DIAGNOSIS — I10 Essential (primary) hypertension: Secondary | ICD-10-CM

## 2012-08-10 DIAGNOSIS — I4891 Unspecified atrial fibrillation: Secondary | ICD-10-CM

## 2012-08-10 DIAGNOSIS — I2510 Atherosclerotic heart disease of native coronary artery without angina pectoris: Secondary | ICD-10-CM

## 2012-08-10 NOTE — Assessment & Plan Note (Signed)
He remains in atrial fib.  We discussed the various medications that we could use for rhythm control.  They all have some degree of risk and or side effects. He thinks he is doing well as he is and is not interested in adding any additional medications at this point.

## 2012-08-10 NOTE — Progress Notes (Signed)
Gabriel Gutierrez Date of Birth  [birth year] [facility] [address]    [address] [place], [place]  [address] [phone]  Fax  [phone]  Problem list: 1. Coronary artery disease-status post CABG 2. Abdominal aortic aneurysm-Status post stent grafting 3. Diabetes mellitus 4. Dyslipidemia 5. Hypertension 6. Atrial fibrillation 7. History of stroke  History of Present Illness:  Gabriel Gutierrez is a 77 yo gentleman with a history of coronary artery disease-status post coronary artery bypass grafting in 2007. He has a history of hypertension.   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    He was recently admitted to the hospital with an acute stroke. He was also found to have rapid atrial fibrillation. He stroke symptoms resolve fairly quickly and he does not have much of the deficit. He remains in atrial fibrillation.  He's been maintained on Xarelto.  He's completely asymptomatic from a cardiac standpoint. He cannot tell that his heart rate is beating irregularly.  Dec. 19, 2013 He had a cardioversion performed last month by Dr. Eldridge Dace who was kind enough to perform the cardioversion after I got tied up in the office. He is completely asymptomatic. He cannot tell whether his heart is in atrial fibrillation or normal sinus rhythm. He has been able to do all of his normal activities without difficulty.  August 10, 2012:  Gabriel Gutierrez is doing well.  His potassium levels have been running high. He had blood checked at his medical doctors office in February and his potassium level was 5.8. He discontinued the benazepril / HCT. Recheck potassium was even higher. He had it checked again this week with results are pending.  He is cutting out all high potassium foods.   He had a cardioversion in   Current Outpatient Prescriptions on File Prior to Visit  Medication Sig  Dispense Refill  . atorvastatin (LIPITOR) 80 MG tablet Take 1 tablet (80 mg total) by mouth daily.  90 tablet  3  . diltiazem (CARDIZEM CD) 120 MG 24 hr capsule Take 1 capsule (120 mg total) by mouth daily.  90 capsule  3  . fish oil-omega-3 fatty acids 1000 MG capsule Take 2 g by mouth daily.       . metFORMIN (GLUCOPHAGE) 500 MG tablet Take 500 mg by mouth 2 (two) times daily with a meal.        . metoprolol succinate (TOPROL-XL) 50 MG 24 hr tablet Take 1 tablet (50 mg total) by mouth daily.  90 tablet  1  . Rivaroxaban (XARELTO) 20 MG TABS Take 1 tablet (20 mg total) by mouth daily with supper.  90 tablet  3  . benazepril-hydrochlorthiazide (LOTENSIN HCT) 20-12.5 MG per tablet Take 1 tablet by mouth daily.       No current facility-administered medications on file prior to visit.    No Known Allergies  Past Medical History  Diagnosis Date  . Diabetes mellitus Age 66  . Hyperlipidemia   . AAA (abdominal aortic aneurysm)   . Hypertension   . Coronary artery disease     post CABG x5 --Severe three-vessel coronary disease  . Arthritis     Past Surgical History  Procedure Laterality Date  . Pr vein bypass graft,aorto-fem-pop  12/23/2005    Infrarenal abdominal aortic aneurysm, right common iliac occlusive disease --   . Colon surgery  ruptured colon  . Median sternotomy    . Hand surgery  1997  . Cataract extraction      left eye  . Skin graft  1992  . Cardiac catheterization  08/26/2005    Est. EF of 65-70% -- Severe three-vessel coronary artery disease -- very heavily calcified vessels and really there is no role for angioplasty  -- he heavily calcified vessels and really there is no role for angioplasty abdominal aortic aneurysm surgery  . Coronary artery bypass graft  09/02/2005    x 5 -- left internal mammary artery graft to the left anterior descending coronary artery, with a saphenous vein graft to the diagonal branch of the LAD, a sequential saphenous vein graft to  the second and fourth obtuse marginal branches of the left circumflex coronary artery, and a saphenous vein graft to the posterior descending branch of RCA  -- Endoscopic vein harvesting from the left leg  . Laparotomy  1998  . Cardioversion  04/15/2012    Procedure: CARDIOVERSION;  Surgeon: Vesta Mixer, MD;  Location: [facility] ENDOSCOPY;  Service: Cardiovascular;  Laterality: N/A;    History  Smoking status  . Former Smoker -- 2.00 packs/day for 44 years  . Types: Cigarettes  . Quit date: 05/20/1988  Smokeless tobacco  . Former Neurosurgeon  . Quit date: 02/11/2003    History  Alcohol Use No    Family History  Problem Relation Age of Onset  . Coronary artery disease Mother   . Coronary artery disease Father   . Diabetes Sister   . Coronary artery disease Sister     Reviw of Systems:  Reviewed in the HPI.  All other systems are negative.  Physical Exam: BP 130/72  Pulse 87  Ht 5\' 6"  (1.676 m)  Wt 169 lb (76.658 kg)  BMI 27.29 kg/m2  SpO2 99% The patient is alert and oriented x 3.  The mood and affect are normal.   Skin: warm and dry.  Color is normal.    HEENT:   [place]/AT, no JVD, normal carotids  Lungs: clear   Heart: irregularly irregular, no murmurs    Abdomen: good BS, non tender  Extremities:  No  C/c/e  Neuro:  Non focal    ECG: 05/07/12 -atrial fibrillation at 70 beats per minute. nonspecific ST and T wave abnormalities. Assessment / Plan:

## 2012-08-10 NOTE — Patient Instructions (Addendum)
Your physician wants you to follow-up in: 6 months You will receive a reminder letter in the mail two months in advance. If you don't receive a letter, please call our office to schedule the follow-up appointment.  Your physician recommends that you return for a FASTING lipid profile: 6 months   Your physician recommends that you continue on your current medications as directed. Please refer to the Current Medication list given to you today.  

## 2012-08-10 NOTE — Assessment & Plan Note (Signed)
He is currently not taking benazepril/HCTZ because of hyperkalemia.  At this point I don't think that he needs any additional blood pressure medication.

## 2012-08-10 NOTE — Assessment & Plan Note (Signed)
Stable. He's not having any episodes of angina.

## 2012-08-17 DIAGNOSIS — E875 Hyperkalemia: Secondary | ICD-10-CM | POA: Diagnosis not present

## 2012-08-17 DIAGNOSIS — J019 Acute sinusitis, unspecified: Secondary | ICD-10-CM | POA: Diagnosis not present

## 2012-08-24 DIAGNOSIS — E875 Hyperkalemia: Secondary | ICD-10-CM | POA: Diagnosis not present

## 2012-08-28 DIAGNOSIS — J019 Acute sinusitis, unspecified: Secondary | ICD-10-CM | POA: Diagnosis not present

## 2012-08-28 DIAGNOSIS — E875 Hyperkalemia: Secondary | ICD-10-CM | POA: Diagnosis not present

## 2012-09-21 DIAGNOSIS — I4891 Unspecified atrial fibrillation: Secondary | ICD-10-CM | POA: Diagnosis not present

## 2012-09-21 DIAGNOSIS — E785 Hyperlipidemia, unspecified: Secondary | ICD-10-CM | POA: Diagnosis not present

## 2012-09-21 DIAGNOSIS — I10 Essential (primary) hypertension: Secondary | ICD-10-CM | POA: Diagnosis not present

## 2012-09-21 DIAGNOSIS — I252 Old myocardial infarction: Secondary | ICD-10-CM | POA: Diagnosis not present

## 2012-09-21 DIAGNOSIS — H906 Mixed conductive and sensorineural hearing loss, bilateral: Secondary | ICD-10-CM | POA: Diagnosis not present

## 2012-09-21 DIAGNOSIS — R7989 Other specified abnormal findings of blood chemistry: Secondary | ICD-10-CM | POA: Diagnosis not present

## 2012-09-21 DIAGNOSIS — I6340 Cerebral infarction due to embolism of unspecified cerebral artery: Secondary | ICD-10-CM | POA: Diagnosis not present

## 2012-09-29 DIAGNOSIS — I10 Essential (primary) hypertension: Secondary | ICD-10-CM | POA: Diagnosis not present

## 2012-09-29 DIAGNOSIS — E875 Hyperkalemia: Secondary | ICD-10-CM | POA: Diagnosis not present

## 2012-09-29 DIAGNOSIS — I4891 Unspecified atrial fibrillation: Secondary | ICD-10-CM | POA: Diagnosis not present

## 2012-09-29 DIAGNOSIS — E119 Type 2 diabetes mellitus without complications: Secondary | ICD-10-CM | POA: Diagnosis not present

## 2012-10-21 DIAGNOSIS — I10 Essential (primary) hypertension: Secondary | ICD-10-CM | POA: Diagnosis not present

## 2012-10-21 DIAGNOSIS — E875 Hyperkalemia: Secondary | ICD-10-CM | POA: Diagnosis not present

## 2012-10-21 DIAGNOSIS — I4891 Unspecified atrial fibrillation: Secondary | ICD-10-CM | POA: Diagnosis not present

## 2012-10-21 DIAGNOSIS — N189 Chronic kidney disease, unspecified: Secondary | ICD-10-CM | POA: Diagnosis not present

## 2012-10-21 DIAGNOSIS — E119 Type 2 diabetes mellitus without complications: Secondary | ICD-10-CM | POA: Diagnosis not present

## 2012-12-14 DIAGNOSIS — N400 Enlarged prostate without lower urinary tract symptoms: Secondary | ICD-10-CM | POA: Diagnosis not present

## 2012-12-14 DIAGNOSIS — I4891 Unspecified atrial fibrillation: Secondary | ICD-10-CM | POA: Diagnosis not present

## 2012-12-14 DIAGNOSIS — E785 Hyperlipidemia, unspecified: Secondary | ICD-10-CM | POA: Diagnosis not present

## 2012-12-14 DIAGNOSIS — I6340 Cerebral infarction due to embolism of unspecified cerebral artery: Secondary | ICD-10-CM | POA: Diagnosis not present

## 2012-12-14 DIAGNOSIS — I10 Essential (primary) hypertension: Secondary | ICD-10-CM | POA: Diagnosis not present

## 2012-12-15 DIAGNOSIS — N401 Enlarged prostate with lower urinary tract symptoms: Secondary | ICD-10-CM | POA: Diagnosis not present

## 2012-12-15 DIAGNOSIS — I10 Essential (primary) hypertension: Secondary | ICD-10-CM | POA: Diagnosis not present

## 2012-12-15 DIAGNOSIS — I4891 Unspecified atrial fibrillation: Secondary | ICD-10-CM | POA: Diagnosis not present

## 2012-12-22 DIAGNOSIS — R42 Dizziness and giddiness: Secondary | ICD-10-CM | POA: Diagnosis not present

## 2012-12-23 ENCOUNTER — Other Ambulatory Visit: Payer: Self-pay

## 2013-01-14 DIAGNOSIS — E119 Type 2 diabetes mellitus without complications: Secondary | ICD-10-CM | POA: Diagnosis not present

## 2013-02-04 DIAGNOSIS — Z23 Encounter for immunization: Secondary | ICD-10-CM | POA: Diagnosis not present

## 2013-02-04 DIAGNOSIS — E119 Type 2 diabetes mellitus without complications: Secondary | ICD-10-CM | POA: Diagnosis not present

## 2013-02-04 DIAGNOSIS — I4891 Unspecified atrial fibrillation: Secondary | ICD-10-CM | POA: Diagnosis not present

## 2013-02-04 DIAGNOSIS — I10 Essential (primary) hypertension: Secondary | ICD-10-CM | POA: Diagnosis not present

## 2013-02-04 DIAGNOSIS — E785 Hyperlipidemia, unspecified: Secondary | ICD-10-CM | POA: Diagnosis not present

## 2013-02-08 ENCOUNTER — Ambulatory Visit: Payer: Medicare Other | Admitting: Neurosurgery

## 2013-02-12 ENCOUNTER — Encounter: Payer: Self-pay | Admitting: Family

## 2013-02-15 ENCOUNTER — Ambulatory Visit (INDEPENDENT_AMBULATORY_CARE_PROVIDER_SITE_OTHER): Payer: Medicare Other | Admitting: Family

## 2013-02-15 ENCOUNTER — Ambulatory Visit (HOSPITAL_COMMUNITY)
Admission: RE | Admit: 2013-02-15 | Discharge: 2013-02-15 | Disposition: A | Discharge status: Home / Self Care | Payer: Medicare Other | Source: Ambulatory Visit | Attending: Vascular Surgery | Admitting: Vascular Surgery

## 2013-02-15 ENCOUNTER — Ambulatory Visit: Payer: Medicare Other | Admitting: Neurosurgery

## 2013-02-15 ENCOUNTER — Encounter: Payer: Self-pay | Admitting: Family

## 2013-02-15 VITALS — BP 144/71 | HR 85 | Resp 16 | Ht 66.0 in | Wt 172.0 lb

## 2013-02-15 DIAGNOSIS — Z48812 Encounter for surgical aftercare following surgery on the circulatory system: Secondary | ICD-10-CM

## 2013-02-15 DIAGNOSIS — I714 Abdominal aortic aneurysm, without rupture: Secondary | ICD-10-CM | POA: Diagnosis not present

## 2013-02-15 DIAGNOSIS — I6529 Occlusion and stenosis of unspecified carotid artery: Secondary | ICD-10-CM | POA: Diagnosis not present

## 2013-02-15 NOTE — Patient Instructions (Addendum)
Abdominal Aortic Aneurysm  An aneurysm is the enlargement (dilatation), bulging, or ballooning out of part of the wall of a vein or artery. An aortic aneurysm is a bulging in the largest artery of the body. This artery supplies blood from the heart to the rest of the body.  The first part of the aorta is called the thoracic aorta. It leaves the heart, rises (ascends), arches, and goes down (descends) through the chest until it reaches the diaphragm. The diaphragm is the muscular part between the chest and abdomen.  The second part of the aorta is called the abdominal aorta after it has passed the diaphragm and continues down through the abdomen. The abdominal aorta ends where it splits to form the two iliac arteries that go to the legs. Aortic aneurysms can develop anywhere along the length of the aorta. The majority are located along the abdominal aorta. The major concern with an aortic aneurysm is that it can enlarge and rupture. This can cause death unless diagnosed and treated promptly. Aneurysms can also develop blood clots or infections. CAUSES  Many aortic aneurysms are caused by arteriosclerosis. Arteriosclerosis can weaken the aortic wall. The pressure of the blood being pumped through the aorta causes it to balloon out at the site of weakness. Therefore, high blood pressure (hypertension) is associated with aneurysm. Other risk factors include:  Age over 60.  Tobacco use.  Being male.  White race.  Family history of aneurysm.  Less frequent causes of abdominal aortic aneurysms include:  Connective tissue diseases.  Abdominal trauma.  Inflammation of blood vessles (arteritis).  Inherited (congenital) malformations.  Infection. SYMPTOMS  The signs and symptoms of an unruptured aneurysm will partly depend on its size and rate of growth.   Abdominal aortic aneurysms may cause pain. The pain typically has a deep quality as if it is piercing into the person. It is felt most  often in the lower back area. The pain is usually steady but may be relieved by changing your body position.  The person may also become aware of an abnormally prominent pulse in the belly (abdominal pulsation). DIAGNOSIS  An aortic aneurysm may be discovered by chance on physical exam, or on X-ray studies done for other reasons. It may be suspected because of other problems such as back or abdominal pain. The following tests may help identify the problem.  X-rays of the abdomen can show calcium deposits in the aneurysm wall.  CT scanning of the abdomen, particularly with contrast medium, is accurate at showing the exact size and shape of the aneurysm.  Ultrasounds give a clear picture of the size of an aneurysm (about 98% accuracy).  MRI scanning is accurate, but often unnecessary.  An abdominal angiogram shows the source of the major blood vessels arising from the aorta. It reveals the size and extent of any aneurysm. It can also show a clot clinging to the wall of the aneurysm (mural thrombus). TREATMENT  Treating an abdominal aortic aneurysm depends on the size. A rupture of an aneurysm is uncommon when they are less than 5 cm wide (2 inches). Rupture is far more common in aneurysms that are over 6 cm wide (2.4 inches).  Surgical repair is usually recommended for all aneurysms over 6 cm wide (2.4 inches). This depends on the health, age, and other circumstances of the individual. This type of surgery consists of opening the abdomen, removing the aneurysm, and sewing a synthetic graft (similar to a cloth tube) in its place. A   less invasive form of this surgery, using stent grafts, is sometimes recommended.  For most patients, elective repair is recommended for aneurysms between 4 and 6 cm (1.6 and 2.4 inches). Elective means the surgery can be done at your convenience. This should not be put off too long if surgery is recommended.  If you smoke, stop immediately. Smoking is a major risk  factor for enlargement and rupture.  Medications may be used to help decrease complications  these include medicine to lower blood pressure and control cholesterol. HOME CARE INSTRUCTIONS   If you smoke, stop. Do not start smoking.  Take all medications as prescribed.  Your caregiver will tell you when to have your aneurysm rechecked, either by ultrasound or CT scan.  If your caregiver has given you a follow-up appointment, it is very important to keep that appointment. Not keeping the appointment could result in a chronic or permanent injury, pain, or disability. If there is any problem keeping the appointment, you must call back to this facility for assistance. SEEK MEDICAL CARE IF:   You develop mild abdominal pain or pressure.  You are able to feel or perceive your aneurysm, and you sense any change. SEEK IMMEDIATE MEDICAL CARE IF:   You develop severe abdominal pain, or severe pain moving (radiating) to your back.  You suddenly develop cold or blue toes or feet.  You suddenly develop lightheadedness or fainting spells. MAKE SURE YOU:   Understand these instructions.  Will watch your condition.  Will get help right away if you are not doing well or get worse. Document Released: 02/13/2005 Document Revised: 07/29/2011 Document Reviewed: 12/08/2007 ExitCare Patient Information 2014 [employer]. Stroke Prevention Some medical conditions and behaviors are associated with an increased chance of having a stroke. You may prevent a stroke by making healthy choices and managing medical conditions. Reduce your risk of having a stroke by:  Staying physically active. Get at least 30 minutes of activity on most or all days.  Not smoking. It may also be helpful to avoid exposure to secondhand smoke.  Limiting alcohol use. Moderate alcohol use is considered to be:  No more than 2 drinks per day for men.  No more than 1 drink per day for nonpregnant women.  Eating healthy  foods.  Include 5 or more servings of fruits and vegetables a day.  Certain diets may be prescribed to address high blood pressure, high cholesterol, diabetes, or obesity.  Managing your cholesterol levels.  A low-saturated fat, low-trans fat, low-cholesterol, and high-fiber diet may control cholesterol levels.  Take any prescribed medicines to control cholesterol as directed by your caregiver.  Managing your diabetes.  A controlled-carbohydrate, controlled-sugar diet is recommended to manage diabetes.  Take any prescribed medicines to control diabetes as directed by your caregiver.  Controlling your high blood pressure (hypertension).  A low-salt (sodium), low-saturated fat, low-trans fat, and low-cholesterol diet is recommended to manage high blood pressure.  Take any prescribed medicines to control hypertension as directed by your caregiver.  Maintaining a healthy weight.  A reduced-calorie, low-sodium, low-saturated fat, low-trans fat, low-cholesterol diet is recommended to manage weight.  Stopping drug abuse.  Avoiding birth control pills.  Talk to your caregiver about the risks of taking birth control pills if you are over 35 years old, smoke, get migraines, or have ever had a blood clot.  Getting evaluated for sleep disorders (sleep apnea).  Talk to your caregiver about getting a sleep evaluation if you snore a lot or have   excessive sleepiness.  Taking medicines as directed by your caregiver.  For some people, aspirin or blood thinners (anticoagulants) are helpful in reducing the risk of forming abnormal blood clots that can lead to stroke. If you have the irregular heart rhythm of atrial fibrillation, you should be on a blood thinner unless there is a good reason you cannot take them.  Understand all your medicine instructions. SEEK IMMEDIATE MEDICAL CARE IF:   You have sudden weakness or numbness of the face, arm, or leg, especially on one side of the  body.  You have sudden confusion.  You have trouble speaking (aphasia) or understanding.  You have sudden trouble seeing in one or both eyes.  You have sudden trouble walking.  You have dizziness.  You have a loss of balance or coordination.  You have a sudden, severe headache with no known cause.  You have new chest pain or an irregular heartbeat. Any of these symptoms may represent a serious problem that is an emergency. Do not wait to see if the symptoms will go away. Get medical help right away. Call your local emergency services (911 in [place] Do not drive yourself to the hospital. Document Released: 06/13/2004 Document Revised: 07/29/2011 Document Reviewed: 12/24/2010 ExitCare Patient Information 2014 [employer].  

## 2013-02-15 NOTE — Progress Notes (Signed)
[facility]  [facility]  History of Present Illness  Gabriel Gutierrez is a 77 y.o. ([birth year]) male returns for continued followup regarding his aortobiiliac [name] Zenith stent graft placed by Dr. Hart Rochester in 2007 for abdominal aortic aneurysm.  He had a stroke October, 2013, has a-fib. for which he is taking Lesia Hausen, now prescribed by Dr. Elease Hashimoto, his cardiologist, will be seeing in 2-3 weeks. His left side weakness has resolved but still has left side facial weakness with smiling but no noticeable facial droop otherwise.  The patient has no had back or abdominal pain, he denies claudication symptoms, denies non-healing wounds.  Pt Diabetic: Yes, states his last A1C was 6.4, was told that his cholesterol is good Pt smoker: former smoker, quit 1990, quit chewing tobacco in 1997    Past Medical History  Diagnosis Date  . Diabetes mellitus Age 4  . Hyperlipidemia   . AAA (abdominal aortic aneurysm)   . Hypertension   . Coronary artery disease     post CABG x5 --Severe three-vessel coronary disease  . Arthritis    Past Surgical History  Procedure Laterality Date  . Pr vein bypass graft,aorto-fem-pop  12/23/2005    Infrarenal abdominal aortic aneurysm, right common iliac occlusive disease --   . Colon surgery      ruptured colon  . Median sternotomy    . Hand surgery  1997  . Cataract extraction      left eye  . Skin graft  1992  . Cardiac catheterization  08/26/2005    Est. EF of 65-70% -- Severe three-vessel coronary artery disease -- very heavily calcified vessels and really there is no role for angioplasty  -- he heavily calcified vessels and really there is no role for angioplasty abdominal aortic aneurysm surgery  . Coronary artery bypass graft  09/02/2005    x 5 -- left internal mammary artery graft to the left anterior descending coronary artery, with a saphenous vein graft to the diagonal branch of the LAD, a sequential saphenous vein  graft to the second and fourth obtuse marginal branches of the left circumflex coronary artery, and a saphenous vein graft to the posterior descending branch of RCA  -- Endoscopic vein harvesting from the left leg  . Laparotomy  1998  . Cardioversion  04/15/2012    Procedure: CARDIOVERSION;  Surgeon: Vesta Mixer, MD;  Location: [facility] ENDOSCOPY;  Service: Cardiovascular;  Laterality: N/A;   Social History History  Substance Use Topics  . Smoking status: Former Smoker -- 2.00 packs/day for 44 years    Types: Cigarettes    Quit date: 05/20/1988  . Smokeless tobacco: Former Neurosurgeon    Quit date: 02/11/2003  . Alcohol Use: No   Family History Family History  Problem Relation Age of Onset  . Coronary artery disease Mother   . Coronary artery disease Father   . Diabetes Sister   . Coronary artery disease Sister    Current Outpatient Prescriptions on File Prior to Visit  Medication Sig Dispense Refill  . atorvastatin (LIPITOR) 80 MG tablet Take 1 tablet (80 mg total) by mouth daily.  90 tablet  3  . benazepril-hydrochlorthiazide (LOTENSIN HCT) 20-12.5 MG per tablet Take 1 tablet by mouth daily.      [name] diltiazem (CARDIZEM CD) 120 MG 24 hr capsule Take 1 capsule (120 mg total) by mouth daily.  90 capsule  3  . fish oil-omega-3 fatty acids 1000 MG capsule Take 2 g by  mouth daily.       . metFORMIN (GLUCOPHAGE) 500 MG tablet Take 500 mg by mouth 2 (two) times daily with a meal.        . metoprolol succinate (TOPROL-XL) 50 MG 24 hr tablet Take 1 tablet (50 mg total) by mouth daily.  90 tablet  1  . Rivaroxaban (XARELTO) 20 MG TABS Take 1 tablet (20 mg total) by mouth daily with supper.  90 tablet  3   No current facility-administered medications on file prior to visit.   No Known Allergies   ROS: [x]  Positive   [ ]  Negative   [ ]  All sytems reviewed and are negative  General: [ ]  Weight loss, [ ]  Fever, [ ]  chills Neurologic: [ ]  Dizziness, [ ]  Blackouts, [ ]  Seizure [ ]  Stroke, [ ]  "Mini  stroke", [ ]  Slurred speech, [ ]  Temporary blindness; [ ]  weakness in arms or legs, [ ]  Hoarseness Cardiac: [ ]  Chest pain/pressure, [ ]  Shortness of breath at rest [ ]  Shortness of breath with exertion, [ ]  Atrial fibrillation or irregular heartbeat Vascular: [ ]  Pain in legs with walking, [ ]  Pain in legs at rest, [ ]  Pain in legs at night,  [ ]  Non-healing ulcer, [ ]  Blood clot in vein/DVT,   Pulmonary: [ ]  Home oxygen, [ ]  Productive cough, [ ]  Coughing up blood, [ ]  Asthma,  [ ]  Wheezing Musculoskeletal:  [ ]  Arthritis, [ ]  Low back pain, [ ]  Joint pain Hematologic: Arly.Keller ] Easy Bruising, [ ]  Anemia; [ ]  Hepatitis Gastrointestinal: [ ]  Blood in stool, [ ]  Gastroesophageal Reflux/heartburn, [ ]  Trouble swallowing Urinary: [ ]  chronic Kidney disease, [ ]  on HD - [ ]  MWF or [ ]  TTHS, [ ]  Burning with urination, [ ]  Difficulty urinating Skin: [ ]  Rashes, [ ]  Wounds Psychological: [ ]  Anxiety, [ ]  Depression  Physical Examination  Filed Vitals:   02/15/13 0929  BP: 144/71  Pulse: 85  Resp: 16   Filed Weights   02/15/13 0929  Weight: 172 lb (78.019 kg)   Body mass index is 27.77 kg/(m^2).  General: A&O x 3, WD.  Pulmonary: Sym exp, good air movt, CTAB, no rales, rhonchi, & wheezing.  Cardiac: Irregularly, irregular rhythm and rate, no peripheral edema.  Vascular: Vessel Right Left  Radial Palpable Palpable  Carotid audiable, without bruit audiable, without bruit  Aorta Not palpable N/A  Femoral Palpable Palpable  Popliteal Not palpable Not palpable  PT Palpable Palpable  DP Palpable Palpable   Gastrointestinal: soft, NTND, -G/R, - HSM, - masses, - CVAT B.  Musculoskeletal: M/S 5/5 throughout, Extremities without ischemic changes.  Neurologic: Pain and light touch intact in extremities, Motor exam as listed above. 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  Non-Invasive Vascular Imaging  October, 2013 carotid artery Duplex at [facility]: No significant ICA stenosis noted on right. Lt ICA velocities in the range of 60 to 79% stenosis. This reading is at an area of slight tortuosity which may falsely elevate the velocities.  EVAR Duplex (Date: 02/15/2013)  AAA sac size: 3.3 cm AP  Technically difficult exam due to bowel gas, body habitus, and scar tissue  CTA Abd/Pelvis Duplex (Date: 02/05/2011) The treated aortic aneurysm continues to  decrease in size measuring 3.0 x 2.2 cm  in maximal diameter compared  to 3.3 x 2.2 cm on the prior study.  1. Stable appearance of successfully treated infrarenal abdominal  aortic aneurysm status post EVAR with a Cook Zenith bifurcated  endoprosthesis. The excluded aneurysm sac measures slightly  smaller in size compared to the prior study. There is no evidence  of endoleak.  2. 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 Additionally, there is been an interval 0.5 cm decrease in  size of the left kidney since the prior study suggesting that this  stenosis may be hemodynamically significant.  3. Mild interval progression in the size of the right internal  iliac artery aneurysm which now measures up to 13 mm compared to 10  mm on the prior study.   Medical Decision Making  Gabriel Gutierrez is a 77 y.o. male who presents s/p EVAR.  Pt is asymptomatic with stable sac size.  I discussed with the patient the importance of surveillance of the endograft.  The next endograft duplex will be scheduled for 6 months.  The patient will follow up with [place] in 6 months with these studies.  I emphasized the importance of maximal medical management including strict control of blood pressure, blood glucose, and lipid levels, antiplatelet agents, obtaining regular exercise, and cessation of smoking.   Carotid Duplex from  [facility] October, 2013 shows moderate left ICA stenosis and minimal right ICA stenosis; will repeat Duplex in 1-2 weeks and will see me the same day for study interpretation and implications.  The patient was given information about AAA including signs, symptoms, treatment, and how to minimize the risk of enlargement and rupture of aneurysms.  The patient was also given information about stroke prevention and advised of the signs and symptoms that should prompt him to call 911.    Thank you for allowing [place] to participate in this patient's care.  Charisse March, RN, MSN, FNP-C [facility] Office: [phone]  Clinic Physician: Myra Gianotti  02/15/2013, 9:05 AM

## 2013-02-19 ENCOUNTER — Encounter: Payer: Self-pay | Admitting: Family

## 2013-02-22 ENCOUNTER — Encounter: Payer: Self-pay | Admitting: Family

## 2013-02-22 ENCOUNTER — Ambulatory Visit (INDEPENDENT_AMBULATORY_CARE_PROVIDER_SITE_OTHER): Payer: Medicare Other | Admitting: Family

## 2013-02-22 ENCOUNTER — Ambulatory Visit (HOSPITAL_COMMUNITY)
Admission: RE | Admit: 2013-02-22 | Discharge: 2013-02-22 | Disposition: A | Discharge status: Home / Self Care | Payer: Medicare Other | Source: Ambulatory Visit | Attending: Family | Admitting: Family

## 2013-02-22 ENCOUNTER — Telehealth: Payer: Self-pay

## 2013-02-22 VITALS — BP 145/83 | HR 65 | Resp 16 | Ht 66.0 in | Wt 168.0 lb

## 2013-02-22 DIAGNOSIS — I6529 Occlusion and stenosis of unspecified carotid artery: Secondary | ICD-10-CM | POA: Diagnosis not present

## 2013-02-22 DIAGNOSIS — Z48812 Encounter for surgical aftercare following surgery on the circulatory system: Secondary | ICD-10-CM

## 2013-02-22 DIAGNOSIS — I701 Atherosclerosis of renal artery: Secondary | ICD-10-CM | POA: Diagnosis not present

## 2013-02-22 DIAGNOSIS — I714 Abdominal aortic aneurysm, without rupture: Secondary | ICD-10-CM | POA: Diagnosis not present

## 2013-02-22 NOTE — Progress Notes (Signed)
Established Carotid Patient  Previous Carotid surgery: No  History of Present Illness  Gabriel Gutierrez is a 77 y.o. male who returns for carotid Duplex study as follow up to October, 2013 carotid artery Duplex at [facility] that showed no significant ICA stenosis noted on right. Lt ICA velocities in the range of 60 to 79% stenosis. This reading is at an area of slight tortuosity which may falsely elevate the velocities. He did have a stroke Oct. 1, 2013, it was determined that the origin of the embolus was his heart as he has chronic atrial fib. Patient denies abdominal pain or back pain, denies claudication symptoms. He was diagnosed with inner ear problem and a medication that he took resolved this. He is also followed for aortobiiliac [name] Zenith stent graft placed by Dr. Hart Rochester in 2007 for abdominal aortic aneurysm, this was evaluated last week. His ROS is unchanged from last week encounter.   Past Medical History  Diagnosis Date  . Diabetes mellitus Age 69  . Hyperlipidemia   . AAA (abdominal aortic aneurysm)   . Hypertension   . Coronary artery disease     post CABG x5 --Severe three-vessel coronary disease  . Arthritis   . Stroke Oct. 1, 2013    Mini stroke- Afib  . Irregular heart beat  Feb 18, 2012    Social History History  Substance Use Topics  . Smoking status: Former Smoker -- 2.00 packs/day for 44 years    Types: Cigarettes    Quit date: 05/20/1988  . Smokeless tobacco: Former Neurosurgeon    Quit date: 02/11/2003  . Alcohol Use: No    Family History Family History  Problem Relation Age of Onset  . Coronary artery disease Mother   . Coronary artery disease Father   . Diabetes Sister   . Coronary artery disease Sister     Surgical History Past Surgical History  Procedure Laterality Date  . Pr vein bypass graft,aorto-fem-pop  12/23/2005    Infrarenal abdominal aortic aneurysm, right common iliac occlusive disease --   . Colon surgery      ruptured colon   . Median sternotomy    . Hand surgery  1997  . Cataract extraction      left eye  . Skin graft  1992  . Cardiac catheterization  08/26/2005    Est. EF of 65-70% -- Severe three-vessel coronary artery disease -- very heavily calcified vessels and really there is no role for angioplasty  -- he heavily calcified vessels and really there is no role for angioplasty abdominal aortic aneurysm surgery  . Coronary artery bypass graft  09/02/2005    x 5 -- left internal mammary artery graft to the left anterior descending coronary artery, with a saphenous vein graft to the diagonal branch of the LAD, a sequential saphenous vein graft to the second and fourth obtuse marginal branches of the left circumflex coronary artery, and a saphenous vein graft to the posterior descending branch of RCA  -- Endoscopic vein harvesting from the left leg  . Laparotomy  1998  . Cardioversion  04/15/2012    Procedure: CARDIOVERSION;  Surgeon: Vesta Mixer, MD;  Location: [facility] ENDOSCOPY;  Service: Cardiovascular;  Laterality: N/A;  . Eye surgery      No Known Allergies  Current Outpatient Prescriptions  Medication Sig Dispense Refill  . atorvastatin (LIPITOR) 80 MG tablet Take 1 tablet (80 mg total) by mouth daily.  90 tablet  3  . benazepril-hydrochlorthiazide (LOTENSIN HCT)  20-12.5 MG per tablet Take 1 tablet by mouth daily.      [name] diltiazem (CARDIZEM CD) 120 MG 24 hr capsule Take 1 capsule (120 mg total) by mouth daily.  90 capsule  3  . fish oil-omega-3 fatty acids 1000 MG capsule Take 2 g by mouth daily.       . metFORMIN (GLUCOPHAGE) 500 MG tablet Take 500 mg by mouth 2 (two) times daily with a meal.        . metoprolol succinate (TOPROL-XL) 50 MG 24 hr tablet Take 25 mg by mouth daily.      . Rivaroxaban (XARELTO) 20 MG TABS Take 1 tablet (20 mg total) by mouth daily with supper.  90 tablet  3  . tamsulosin (FLOMAX) 0.4 MG CAPS capsule 0.4 mg daily.        No current facility-administered medications for  this visit.    Physical Examination  Filed Vitals:   02/22/13 1144  BP: 145/83  Pulse: 65  Resp:    Filed Weights   02/22/13 1139  Weight: 168 lb (76.204 kg)   Body mass index is 27.13 kg/(m^2).  General: A&O x 3, WD.  Pulmonary: Sym exp, good air movt, CTAB, no rales, rhonchi, & wheezing.  Cardiac: Irregular rhythm and rate, no peripheral edema.  Vascular:  Vessel  Right  Left   Radial  Palpable  Palpable   Carotid  audiable, without bruit  audiable, without bruit   Aorta  Not palpable  N/A   Femoral  Palpable  Palpable   Popliteal  Not palpable  Not palpable   PT  Palpable  Palpable   DP  Palpable  Palpable     Gastrointestinal: soft, NTND, -G/R, - HSM, - masses, - CVAT B.  Musculoskeletal: M/S 5/5 throughout, Extremities without ischemic changes.  Neurologic: Pain and light touch intact in extremities, Motor exam as listed above. 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   Non-Invasive Vascular Imaging CAROTID DUPLEX 02/22/2013   <40% stenosis of bilateral ICA's, bilateral ICA's are tortuous.   October, 2013 carotid artery Duplex at [facility] that showed no significant ICA stenosis noted on right. Lt ICA velocities in the range of 60 to 79% stenosis. This reading is at an area of slight tortuosity which may falsely elevate the velocities.   These findings are Improved from previous exam.  Assessment: Gabriel Gutierrez is a 77 y.o. male who presents with asymptomatic <40% stenosis of bilateral ICA's, bilateral ICA's are tortuous. . The  ICA stenosis is  Improved from previous exam.  Plan: Follow-up in follow up in 6 months with Duplex interrogation of bilateral renal arteries.  I discussed in depth with the patient the nature of atherosclerosis, and emphasized the importance of  maximal medical management including strict control of blood pressure, blood glucose, and lipid levels, obtaining regular exercise, and continued cessation of smoking.  The patient is aware that without maximal medical management the underlying atherosclerotic disease process will progress, limiting the benefit of any interventions. The patient was given information about stroke prevention and what symptoms should prompt the patient to seek immediate medical care. Thank you for allowing [place] to participate in this patient's care.  Charisse March, RN, MSN, FNP-C [facility] Office: [phone]  Clinic Physician: Hart Rochester  02/22/2013 12:11  PM   

## 2013-02-22 NOTE — Telephone Encounter (Signed)
Left message for pt. to call for further information from nurse practitioner.

## 2013-02-22 NOTE — Telephone Encounter (Signed)
Message copied by Phillips Odor on Mon Feb 22, 2013  4:16 PM ------      Message from: Annye Rusk      Created: Mon Feb 22, 2013  3:29 PM      Regarding: change follow up from 1 year for carotid Duplx tpo 6 months for bilaeral renal Duplex       Please call pt. to cancel his 1 year carotid Duplex follow up and reschedule him for 6 month bilateral renal Duplex.      Please let him know that since his carotid arteries are normal we do not need to recheck this.      But he did have some narrowing of the arteries leading to his kidneys that need to be rechecked in 6 months.      If he has any questions please let me know.      Thank you,      Rosalita Chessman ------

## 2013-02-22 NOTE — Patient Instructions (Addendum)
Abdominal Aortic Aneurysm  An aneurysm is the enlargement (dilatation), bulging, or ballooning out of part of the wall of a vein or artery. An aortic aneurysm is a bulging in the largest artery of the body. This artery supplies blood from the heart to the rest of the body.  The first part of the aorta is called the thoracic aorta. It leaves the heart, rises (ascends), arches, and goes down (descends) through the chest until it reaches the diaphragm. The diaphragm is the muscular part between the chest and abdomen.  The second part of the aorta is called the abdominal aorta after it has passed the diaphragm and continues down through the abdomen. The abdominal aorta ends where it splits to form the two iliac arteries that go to the legs. Aortic aneurysms can develop anywhere along the length of the aorta. The majority are located along the abdominal aorta. The major concern with an aortic aneurysm is that it can enlarge and rupture. This can cause death unless diagnosed and treated promptly. Aneurysms can also develop blood clots or infections. CAUSES  Many aortic aneurysms are caused by arteriosclerosis. Arteriosclerosis can weaken the aortic wall. The pressure of the blood being pumped through the aorta causes it to balloon out at the site of weakness. Therefore, high blood pressure (hypertension) is associated with aneurysm. Other risk factors include:  Age over 60.  Tobacco use.  Being male.  White race.  Family history of aneurysm.  Less frequent causes of abdominal aortic aneurysms include:  Connective tissue diseases.  Abdominal trauma.  Inflammation of blood vessles (arteritis).  Inherited (congenital) malformations.  Infection. SYMPTOMS  The signs and symptoms of an unruptured aneurysm will partly depend on its size and rate of growth.   Abdominal aortic aneurysms may cause pain. The pain typically has a deep quality as if it is piercing into the person. It is felt most  often in the lower back area. The pain is usually steady but may be relieved by changing your body position.  The person may also become aware of an abnormally prominent pulse in the belly (abdominal pulsation). DIAGNOSIS  An aortic aneurysm may be discovered by chance on physical exam, or on X-ray studies done for other reasons. It may be suspected because of other problems such as back or abdominal pain. The following tests may help identify the problem.  X-rays of the abdomen can show calcium deposits in the aneurysm wall.  CT scanning of the abdomen, particularly with contrast medium, is accurate at showing the exact size and shape of the aneurysm.  Ultrasounds give a clear picture of the size of an aneurysm (about 98% accuracy).  MRI scanning is accurate, but often unnecessary.  An abdominal angiogram shows the source of the major blood vessels arising from the aorta. It reveals the size and extent of any aneurysm. It can also show a clot clinging to the wall of the aneurysm (mural thrombus). TREATMENT  Treating an abdominal aortic aneurysm depends on the size. A rupture of an aneurysm is uncommon when they are less than 5 cm wide (2 inches). Rupture is far more common in aneurysms that are over 6 cm wide (2.4 inches).  Surgical repair is usually recommended for all aneurysms over 6 cm wide (2.4 inches). This depends on the health, age, and other circumstances of the individual. This type of surgery consists of opening the abdomen, removing the aneurysm, and sewing a synthetic graft (similar to a cloth tube) in its place. A   less invasive form of this surgery, using stent grafts, is sometimes recommended.  For most patients, elective repair is recommended for aneurysms between 4 and 6 cm (1.6 and 2.4 inches). Elective means the surgery can be done at your convenience. This should not be put off too long if surgery is recommended.  If you smoke, stop immediately. Smoking is a major risk  factor for enlargement and rupture.  Medications may be used to help decrease complications  these include medicine to lower blood pressure and control cholesterol. HOME CARE INSTRUCTIONS   If you smoke, stop. Do not start smoking.  Take all medications as prescribed.  Your caregiver will tell you when to have your aneurysm rechecked, either by ultrasound or CT scan.  If your caregiver has given you a follow-up appointment, it is very important to keep that appointment. Not keeping the appointment could result in a chronic or permanent injury, pain, or disability. If there is any problem keeping the appointment, you must call back to this facility for assistance. SEEK MEDICAL CARE IF:   You develop mild abdominal pain or pressure.  You are able to feel or perceive your aneurysm, and you sense any change. SEEK IMMEDIATE MEDICAL CARE IF:   You develop severe abdominal pain, or severe pain moving (radiating) to your back.  You suddenly develop cold or blue toes or feet.  You suddenly develop lightheadedness or fainting spells. MAKE SURE YOU:   Understand these instructions.  Will watch your condition.  Will get help right away if you are not doing well or get worse. Document Released: 02/13/2005 Document Revised: 07/29/2011 Document Reviewed: 12/08/2007 [employer] Patient Information 2014 [employer].  

## 2013-02-23 NOTE — Telephone Encounter (Signed)
Pt. returned call.  Advised of change in recommendation to cancel the carotid duplex in one year; since carotid duplex is normal, and to schedule bilateral renal duplex in 6 months due to some narrowing of renal arteries.  Pt. Verb. Understanding.

## 2013-02-23 NOTE — Addendum Note (Signed)
Addended by: Sharee Pimple on: 02/23/2013 09:30 AM   Modules accepted: Orders

## 2013-03-08 ENCOUNTER — Other Ambulatory Visit: Payer: Self-pay | Admitting: Cardiovascular Disease

## 2013-03-12 ENCOUNTER — Telehealth: Payer: Self-pay

## 2013-03-12 NOTE — Telephone Encounter (Signed)
patient called wanting refills for his atrovastatin, xarelto and dilitazem. it had [facility]ensent to [facility], and I also put samples up front until his rx comes in

## 2013-03-25 ENCOUNTER — Other Ambulatory Visit: Payer: Self-pay

## 2013-04-29 ENCOUNTER — Encounter: Payer: Self-pay | Admitting: Cardiovascular Disease

## 2013-04-29 ENCOUNTER — Ambulatory Visit (INDEPENDENT_AMBULATORY_CARE_PROVIDER_SITE_OTHER): Payer: Medicare Other | Admitting: Cardiovascular Disease

## 2013-04-29 VITALS — BP 116/70 | HR 67 | Ht 66.0 in | Wt 173.0 lb

## 2013-04-29 DIAGNOSIS — I10 Essential (primary) hypertension: Secondary | ICD-10-CM | POA: Diagnosis not present

## 2013-04-29 DIAGNOSIS — I2510 Atherosclerotic heart disease of native coronary artery without angina pectoris: Secondary | ICD-10-CM | POA: Diagnosis not present

## 2013-04-29 DIAGNOSIS — E785 Hyperlipidemia, unspecified: Secondary | ICD-10-CM | POA: Diagnosis not present

## 2013-04-29 DIAGNOSIS — I4891 Unspecified atrial fibrillation: Secondary | ICD-10-CM | POA: Diagnosis not present

## 2013-04-29 MED ORDER — RIVAROXABAN 20 MG PO TABS: ORAL_TABLET | ORAL | Status: DC

## 2013-04-29 MED ORDER — DILTIAZEM HCL ER COATED BEADS 120 MG PO CP24: ORAL_CAPSULE | ORAL | Status: DC

## 2013-04-29 MED ORDER — ATORVASTATIN CALCIUM 80 MG PO TABS: ORAL_TABLET | ORAL | Status: DC

## 2013-04-29 NOTE — Patient Instructions (Signed)
Your physician wants you to follow-up in: 6 MONTHS  You will receive a reminder letter in the mail two months in advance. If you don't receive a letter, please call our office to schedule the follow-up appointment.   Your physician recommends that you continue on your current medications as directed. Please refer to the Current Medication list given to you today.   Your physician recommends that you return for a FASTING lipid profile: 6 MONTHS   

## 2013-04-29 NOTE — Progress Notes (Signed)
Gabriel Gutierrez Date of Birth  11-04-1935 [facility] [address]    [address] [place], [place]  [address] [phone]  Fax  [phone]  Problem list: 1. Coronary artery disease-status post CABG 2. Abdominal aortic aneurysm-Status post stent grafting 3. Diabetes mellitus 4. Dyslipidemia 5. Hypertension 6. Atrial fibrillation -  We have tried cardioversion in the past but it lasted only 1 week.  7. History of stroke  History of Present Illness:  Gabriel Gutierrez is a 77 yo gentleman with a history of coronary artery disease-status post coronary artery bypass grafting in 2007. He has a history of hypertension.   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    He was recently admitted to the hospital with an acute stroke. He was also found to have rapid atrial fibrillation. He stroke symptoms resolve fairly quickly and he does not have much of the deficit. He remains in atrial fibrillation.  He's been maintained on Xarelto.  He's completely asymptomatic from a cardiac standpoint. He cannot tell that his heart rate is beating irregularly.  Dec. 19, 2013 He had a cardioversion performed last month by Dr. Eldridge Dace who was kind enough to perform the cardioversion after I got tied up in the office. He is completely asymptomatic. He cannot tell whether his heart is in atrial fibrillation or normal sinus rhythm. He has been able to do all of his normal activities without difficulty.  August 10, 2012:  Gabriel Gutierrez is doing well.  His potassium levels have been running high. He had blood checked at his medical doctors office in February and his potassium level was 5.8. He discontinued the benazepril / HCT. Recheck potassium was even higher. He had it checked again this week with results are pending.  He is cutting out all high potassium foods.   He had a cardioversion in   Dec.  11, 2014:  Gabriel Gutierrez is doing well.  Active , works hard every day, no CP.  No dyspnea.  Sleeping well at night.  Completely asymptomatic from an a-fib standpoint.   Current Outpatient Prescriptions on File Prior to Visit  Medication Sig Dispense Refill  . atorvastatin (LIPITOR) 80 MG tablet TAKE 1 TABLET DAILY  90 tablet  0  . diltiazem (CARDIZEM CD) 120 MG 24 hr capsule TAKE 1 CAPSULE DAILY  90 capsule  0  . metFORMIN (GLUCOPHAGE) 500 MG tablet Take 500 mg by mouth 2 (two) times daily with a meal.        . metoprolol succinate (TOPROL-XL) 50 MG 24 hr tablet Take 25 mg by mouth daily.      . tamsulosin (FLOMAX) 0.4 MG CAPS capsule 0.4 mg daily.       [name] 20 MG TABS tablet TAKE 1 TABLET DAILY WITH SUPPER  30 tablet  0   No current facility-administered medications on file prior to visit.    No Known Allergies  Past Medical History  Diagnosis Date  . Diabetes mellitus Age 70  . Hyperlipidemia   . AAA (abdominal aortic aneurysm)   . Hypertension   . Coronary artery disease     post CABG x5 --Severe three-vessel coronary disease  . Arthritis   . Stroke Oct. 1, 2013    Mini stroke- Afib  . Irregular heart beat  Feb 18, 2012    Past Surgical History  Procedure  Laterality Date  . Pr vein bypass graft,aorto-fem-pop  12/23/2005    Infrarenal abdominal aortic aneurysm, right common iliac occlusive disease --   . Colon surgery      ruptured colon  . Median sternotomy    . Hand surgery  1997  . Cataract extraction      left eye  . Skin graft  1992  . Cardiac catheterization  08/26/2005    Est. EF of 65-70% -- Severe three-vessel coronary artery disease -- very heavily calcified vessels and really there is no role for angioplasty  -- he heavily calcified vessels and really there is no role for angioplasty abdominal aortic aneurysm surgery  . Coronary artery bypass graft  09/02/2005    x 5 -- left internal mammary artery graft to the left anterior descending coronary artery, with a  saphenous vein graft to the diagonal branch of the LAD, a sequential saphenous vein graft to the second and fourth obtuse marginal branches of the left circumflex coronary artery, and a saphenous vein graft to the posterior descending branch of RCA  -- Endoscopic vein harvesting from the left leg  . Laparotomy  1998  . Cardioversion  04/15/2012    Procedure: CARDIOVERSION;  Surgeon: Vesta Mixer, MD;  Location: [facility] ENDOSCOPY;  Service: Cardiovascular;  Laterality: N/A;  . Eye surgery      History  Smoking status  . Former Smoker -- 2.00 packs/day for 44 years  . Types: Cigarettes  . Quit date: 05/20/1988  Smokeless tobacco  . Former Neurosurgeon  . Quit date: 02/11/2003    History  Alcohol Use No    Family History  Problem Relation Age of Onset  . Coronary artery disease Mother   . Coronary artery disease Father   . Diabetes Sister   . Coronary artery disease Sister     Reviw of Systems:  Reviewed in the HPI.  All other systems are negative.  Physical Exam: BP 116/70  Pulse 67  Ht 5\' 6"  (1.676 m)  Wt 173 lb (78.472 kg)  BMI 27.94 kg/m2  SpO2 96% The patient is alert and oriented x 3.  The mood and affect are normal.   Skin: warm and dry.  Color is normal.    HEENT:   [place]/AT, no JVD, normal carotids  Lungs: clear   Heart: irregularly irregular, no murmurs    Abdomen: good BS, non tender  Extremities:  No  C/c/e  Neuro:  Non focal    ECG: Dec. 11, 2014:  Atrial fib at 67. No ST or T wave changes.   Assessment / Plan:

## 2013-04-29 NOTE — Assessment & Plan Note (Signed)
Gabriel Gutierrez is doing well.  No angina.  Continue current meds.

## 2013-04-29 NOTE — Assessment & Plan Note (Signed)
He is asymptomatic from an a-fib standpoint.  Same medications.

## 2013-04-29 NOTE — Assessment & Plan Note (Signed)
He's doing well. Continue with his same medications. We'll check fasting labs at his next visit.

## 2013-05-05 DIAGNOSIS — J209 Acute bronchitis, unspecified: Secondary | ICD-10-CM | POA: Diagnosis not present

## 2013-06-15 DIAGNOSIS — I4891 Unspecified atrial fibrillation: Secondary | ICD-10-CM | POA: Diagnosis not present

## 2013-06-15 DIAGNOSIS — E119 Type 2 diabetes mellitus without complications: Secondary | ICD-10-CM | POA: Diagnosis not present

## 2013-06-15 DIAGNOSIS — I10 Essential (primary) hypertension: Secondary | ICD-10-CM | POA: Diagnosis not present

## 2013-06-15 DIAGNOSIS — E785 Hyperlipidemia, unspecified: Secondary | ICD-10-CM | POA: Diagnosis not present

## 2013-07-01 DIAGNOSIS — M545 Low back pain, unspecified: Secondary | ICD-10-CM | POA: Diagnosis not present

## 2013-08-23 DIAGNOSIS — J441 Chronic obstructive pulmonary disease with (acute) exacerbation: Secondary | ICD-10-CM | POA: Diagnosis not present

## 2013-08-30 ENCOUNTER — Encounter: Payer: Self-pay | Admitting: Family

## 2013-08-31 ENCOUNTER — Encounter: Payer: Self-pay | Admitting: Family

## 2013-08-31 ENCOUNTER — Ambulatory Visit (HOSPITAL_COMMUNITY)
Admission: RE | Admit: 2013-08-31 | Discharge: 2013-08-31 | Disposition: A | Discharge status: Home / Self Care | Payer: Medicare Other | Source: Ambulatory Visit | Attending: Family | Admitting: Family

## 2013-08-31 ENCOUNTER — Ambulatory Visit (INDEPENDENT_AMBULATORY_CARE_PROVIDER_SITE_OTHER): Payer: Medicare Other | Admitting: Family

## 2013-08-31 VITALS — BP 123/83 | HR 75 | Resp 16 | Ht 66.0 in | Wt 163.0 lb

## 2013-08-31 DIAGNOSIS — I714 Abdominal aortic aneurysm, without rupture, unspecified: Secondary | ICD-10-CM | POA: Diagnosis not present

## 2013-08-31 DIAGNOSIS — I701 Atherosclerosis of renal artery: Secondary | ICD-10-CM | POA: Insufficient documentation

## 2013-08-31 DIAGNOSIS — Z48812 Encounter for surgical aftercare following surgery on the circulatory system: Secondary | ICD-10-CM

## 2013-08-31 NOTE — Progress Notes (Signed)
Established Renal Artery Stenosis  History of Present Illness  Gabriel Gutierrez is a 78 y.o. ([birth year]) male who presents with chief complaint: follow up on renal artery stenosis.   Previous (02/05/2011) CTA abdomen/pelvis: Developing moderately severe ostial stenosis of the left renal  artery. Additionally, there is been an interval 0.5 cm decrease in  size of the left kidney since the prior study suggesting that this  stenosis may be hemodynamically significant.  He quit smoking in 1990. He does have DM, well controlled. He denies back or abdominal pain.  The patient's blood pressure has been stable.  The patient's blood pressure medication regimen has remained stable.  The patient's urinary history has remained stable, he does seem to have BPH, Flomax helps. He is also followed for aortobiiliac [name] Zenith stent graft placed by Dr. Kellie Simmering in 2007 for abdominal aortic aneurysm. He had a stroke October, 2013, has a-fib. for which he is taking Jennye Moccasin, now prescribed by Dr. Acie Fredrickson, his cardiologist.  His left side weakness has resolved but still has left side facial weakness with smiling but no noticeable facial droop otherwise.  The patient's PMH, PSH, SH, FamHx, Med, and Allergies are unchanged from 02/22/2013.  On ROS today: see HPI for pertinent positives and negatives.  Physical Examination  Filed Vitals:   08/31/13 0947  BP: 123/83  Pulse: 75  Resp: 16   Filed Weights   08/31/13 0947  Weight: 163 lb (73.936 kg)   Body mass index is 26.32 kg/(m^2).  General: A&O x 3, WD.  Pulmonary: Sym exp, good air movt, CTAB, no rales, rhonchi, & wheezing.  Cardiac: Irregular rhythm and rate, no peripheral edema.  Vascular:  Vessel  Right  Left   Radial  Palpable  Palpable   Carotid  audiable, without bruit  audiable, without bruit   Aorta  Not palpable  N/A   Femoral  Palpable  Palpable   Popliteal  Not palpable  Not palpable   PT  Palpable  Palpable   DP  Palpable   Palpable    Gastrointestinal: soft, NTND, -G/R, - HSM, - masses, - CVAT B.   Musculoskeletal: M/S 5/5 throughout, Extremities without ischemic changes.   Neurologic: Pain and light touch intact in extremities, Motor exam as listed above. 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   Non-Invasive Vascular Imaging   RENAL ARTERY DUPLEX EVALUATION    INDICATION: Left renal artery stenosis as reported on CTA 02/11/2012    PREVIOUS INTERVENTION(S): Endovascular aortic repair 2007    DUPLEX EXAM:     AORTA Peak Systolic Velocity (cm/s): 61    RIGHT  LEFT   Peak Systolic Velocities (cm/s) Comments  Peak Systolic Velocities (cm/s) Comments  197  Renal Artery Origin/Proximal 254   130  Renal Artery Mid 106   109  Renal Artery Distal 89     Accessory Renal Artery (when present)    3.2  Renal / Aortic Ratio (RAR) 4.16  11.45 Kidney Size (cm) 9.95  Patent  Renal Vein Patent    ADDITIONAL FINDINGS:     IMPRESSION: 1. <60% stenosis of the right renal artery. 2. >60% stenosis of the left renal artery. 3. Left kidney is smaller than the right.    Compared to the previous exam:  No previous duplex at this facility for  comparison.      Other Imaging results:  CAROTID DUPLEX 02/22/2013  <40% stenosis of bilateral ICA's, bilateral ICA's are tortuous.  October, 2013 carotid artery Duplex at [facility] that showed  no significant ICA stenosis noted on right. Lt ICA velocities in the range of 60 to 79% stenosis. This reading is at an area of slight tortuosity which may falsely elevate the velocities.  These findings are Improved from previous exam.  EVAR Duplex (Date: 02/15/2013)  AAA sac size: 3.3 cm AP  Technically difficult exam due to bowel gas, body habitus, and scar tissue  CTA Abd/Pelvis Duplex (Date:  02/05/2011)  The treated aortic aneurysm continues to  decrease in size measuring 3.0 x 2.2 cm in maximal diameter compared  to 3.3 x 2.2 cm on the prior study.  1. Stable appearance of successfully treated infrarenal abdominal  aortic aneurysm status post EVAR with a Cook Zenith bifurcated  endoprosthesis. The excluded aneurysm sac measures slightly  smaller in size compared to the prior study. There is no evidence  of endoleak.  2. 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 Additionally, there is been an interval 0.5 cm decrease in  size of the left kidney since the prior study suggesting that this  stenosis may be hemodynamically significant.  3. Mild interval progression in the size of the right internal  iliac artery aneurysm which now measures up to 13 mm compared to 10  mm on the prior study.   Medical Decision Making  Gabriel Gutierrez is a 78 y.o. male who presents with: mild left RAS, with stable and controlled blood pressure.  Based on the patient's vascular studies and examination, and after discussing with Dr. Kellie Simmering,  I have offered the patient: AAA Duplex and bilateral renal artery Duplex in September, 2015.  I discussed in depth with the patient the nature of atherosclerosis, and emphasized the importance of maximal medical management including strict control of blood pressure, blood glucose, and lipid levels, antiplatelet agents, obtaining regular exercise, and continued cessation of smoking.  The patient is aware that without maximal medical management the underlying atherosclerotic disease process will progress, limiting the benefit of any interventions.  Thank you for allowing [place] to participate in this patient's care.  Clemon Chambers, RN, MSN, FNP-C [facility] Office: [phone]  Clinic MD: Kellie Simmering  08/31/2013, 9:27 AM

## 2013-08-31 NOTE — Patient Instructions (Signed)
Renal Artery Stenosis Renal artery stenosis (RAS) is the narrowing of the artery that supplies blood to the kidney. If the narrowing is critical and the kidney does not get enough blood, hypertension (high blood pressure) can develop. This is called renal vascular hypertension (RVH). This is a common, uncommon cause of secondary hypertension. It does not usually happen until there is at least a 70% narrowing of the artery. Decreased blood flow through the renal artery causes the kidney to release increased amounts of a hormone. It is called renin. Renin is a strong blood pressure regulator. When it is high, it causes changes that lead to hypertension. Eventually the kidney not receiving enough blood may shrink in size and become less useful. The high blood pressure that is produced can eventually damage and destroy the remaining kidney. This is called hypertensive nephrosclerosis. If both kidneys fail, it will lead to chronic renal failure.  CAUSES  Most renal artery stenosis is caused by a hardening of the arteries (atherosclerosis). This is called Atherosclerotic Renal Artery Stenosis (AS-RAS). It is caused by a build-up of cholesterol (plaques) on the inner lining of the renal artery. A much less common cause is Fibromuscular Dysplasia (FMD). With it, there is an abnormality in the muscular lining of the renal artery. FMD-RAS occurs almost exclusively in women aged 71 to 29. It rarely affects African Americans or Asians.  SYMPTOMS  Often high blood pressure is discovered on a routine blood pressure check. It may be the only sign that something is wrong. Other problems that may occur are:  You may develop calf pain when walking. This is called intermittent claudication. It may be a sign of bad circulation in the legs.  Inability to use certain blood pressure pills such as angiotensin-I (ACE-I) inhibitors or angiotensin receptor blockers (ARB's). These could cause sudden drops in blood pressure with  worsening of kidney function.  More than three antihypertensive medications may be needed for blood pressure control.  New onset of high blood pressure if you are over 55. DIAGNOSIS  Your caregiver may find suggestions of this on exam if he finds bruits (like murmurs) on listening to your abdomen (belly) or the large arteries in your neck. Your caregiver may also suspect this there is a sudden worsening of your blood pressure when it has been well controlled and you are over age 22. Additional testing that may be done includes:  One diagnostic method used for renal artery stenosis (RAS) is to measure and compare the level of renin, (blood pressure-regulating hormone released by the kidneys), in the right to the left renal veins. If the amount of renin released by one-side is markedly higher than the other, this identifies a high renin-releasing kidney consistent with RAS.  FMD-RAS is often found on renal scan with ACE-inhibitor challenge, or ultrasound with Doppler.  FMD responds well to angioplasty and stenting. The results of stenting in FMD are usually long lasting. RISK FACTORS  Most renal artery stenosis is caused by a hardening of the arteries. This is called atherosclerosis. Other risk factors associated with the development of atherosclerotic RAS include the following:   Carotid artery disease.  Obesity.  High blood pressure.  Heredity.  Old age.  Fibromuscular dysplasia.  Diabetes mellitus.  Smoking.  Hardening of the arteries. TREATMENT   Renal vascular hypertension can be very severe. It can also be difficult to control.  Medication is used to control high blood pressure (hypertension). Blood pressure medications that directly affect the renin angiotensin pathway  can be used toe help control blood pressure. ACE inhibitors and angiotensin receptor blockers (ARB's) are often effective in patients with unilateral RAS. In some cases, patients with RAS are resistant to  these medications.  In patients with bilateral RAS, these medications must be used carefully. They may cause acute renal failure (ARF). If acute renal failure develops (if creatinine increases by more than 30%), the medication is discontinued. The patient is evaluated for bilateral RAS.  Angioplasty and stenting may be used to improve blood flow. The goal is to improve the circulation of blood flow to the kidney and prevent the release of excess renin, which can help to decrease blood pressure. This helps to prevent atrophy of the kidney. In general, patients with AS-RAS should have stenting done. This is because plasty by itself has a high incidence of re-stenosis.  Surgery to bypass the narrowing may be done. If the kidney with RAS has diminished in size or strength (atrophied ), surgical removal of the kidney may be advised. This is called nephrectomy. Document Released: 01/30/2005 Document Revised: 07/29/2011 Document Reviewed: 05/05/2008 [facility] Patient Information 2014 [place].

## 2013-09-02 NOTE — Addendum Note (Signed)
Addended by: Dorthula Rue L on: 09/02/2013 04:10 PM   Modules accepted: Orders

## 2013-09-08 DIAGNOSIS — J410 Simple chronic bronchitis: Secondary | ICD-10-CM | POA: Diagnosis not present

## 2013-10-15 DIAGNOSIS — I10 Essential (primary) hypertension: Secondary | ICD-10-CM | POA: Diagnosis not present

## 2013-10-15 DIAGNOSIS — E119 Type 2 diabetes mellitus without complications: Secondary | ICD-10-CM | POA: Diagnosis not present

## 2013-10-15 DIAGNOSIS — E785 Hyperlipidemia, unspecified: Secondary | ICD-10-CM | POA: Diagnosis not present

## 2013-10-26 DIAGNOSIS — R7301 Impaired fasting glucose: Secondary | ICD-10-CM | POA: Diagnosis not present

## 2013-10-26 DIAGNOSIS — R7989 Other specified abnormal findings of blood chemistry: Secondary | ICD-10-CM | POA: Diagnosis not present

## 2013-10-27 ENCOUNTER — Ambulatory Visit (INDEPENDENT_AMBULATORY_CARE_PROVIDER_SITE_OTHER): Payer: Medicare Other | Admitting: Cardiovascular Disease

## 2013-10-27 ENCOUNTER — Other Ambulatory Visit: Payer: Medicare Other

## 2013-10-27 ENCOUNTER — Encounter: Payer: Self-pay | Admitting: Cardiovascular Disease

## 2013-10-27 VITALS — BP 140/64 | HR 64 | Ht 66.0 in | Wt 168.0 lb

## 2013-10-27 DIAGNOSIS — I4891 Unspecified atrial fibrillation: Secondary | ICD-10-CM

## 2013-10-27 DIAGNOSIS — I2510 Atherosclerotic heart disease of native coronary artery without angina pectoris: Secondary | ICD-10-CM

## 2013-10-27 NOTE — Progress Notes (Signed)
Gabriel Gutierrez Date of Birth  20-Mar-1936 [facility] [address]    [place], [place]  [address] [phone]  Fax  [phone]  Problem list: 1. Coronary artery disease-status post CABG 2. Abdominal aortic aneurysm-Status post stent grafting 3. Diabetes mellitus 4. Dyslipidemia 5. Hypertension 6. Atrial fibrillation -  We have tried cardioversion in the past but it lasted only 1 week.  7. History of stroke  History of Present Illness:  Gabriel Gutierrez is a 78 yo gentleman with a history of coronary artery disease-status post coronary artery bypass grafting in 2007. He has a history of hypertension.   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    He was recently admitted to the hospital with an acute stroke. He was also found to have rapid atrial fibrillation. He stroke symptoms resolve fairly quickly and he does not have much of the deficit. He remains in atrial fibrillation.  He's been maintained on Xarelto.  He's completely asymptomatic from a cardiac standpoint. He cannot tell that his heart rate is beating irregularly.  Dec. 19, 2013 He had a cardioversion performed last month by Dr. Irish Lack who was kind enough to perform the cardioversion after I got tied up in the office. He is completely asymptomatic. He cannot tell whether his heart is in atrial fibrillation or normal sinus rhythm. He has been able to do all of his normal activities without difficulty.  August 10, 2012:  Gabriel Gutierrez is doing well.  His potassium levels have been running high. He had blood checked at his medical doctors office in February and his potassium level was 5.8. He discontinued the benazepril / HCT. Recheck potassium was even higher. He had it checked again this week with results are pending.  He is cutting out all high potassium foods.   He had a cardioversion in   Dec.  11, 2014:  Gabriel Gutierrez is doing well.  Active , works hard every day, no CP.  No dyspnea.  Sleeping well at night.  Completely asymptomatic from an a-fib standpoint.   October 27, 2013: Gabriel Gutierrez is doing ok.    Not able to do as much as he would like.  Still very active - plays golf,  Walking some .  Has some knee problems that limits him.    Current Outpatient Prescriptions on File Prior to Visit  Medication Sig Dispense Refill  . atorvastatin (LIPITOR) 80 MG tablet TAKE 1 TABLET DAILY  90 tablet  1  . diltiazem (CARDIZEM CD) 120 MG 24 hr capsule TAKE 1 CAPSULE DAILY  90 capsule  1  . metFORMIN (GLUCOPHAGE) 500 MG tablet Take 500 mg by mouth 2 (two) times daily with a meal.        . metoprolol succinate (TOPROL-XL) 50 MG 24 hr tablet Take 25 mg by mouth daily.      . Rivaroxaban (XARELTO) 20 MG TABS tablet TAKE 1 TABLET DAILY WITH SUPPER  90 tablet  1  . tamsulosin (FLOMAX) 0.4 MG CAPS capsule 0.4 mg daily.        No current facility-administered medications on file prior to visit.    No Known Allergies  Past Medical History  Diagnosis Date  . Diabetes mellitus Age 78  . Hyperlipidemia   . AAA (abdominal aortic aneurysm)   . Hypertension   . Coronary artery disease  post CABG x5 --Severe three-vessel coronary disease  . Arthritis   . Stroke Oct. 1, 2013    Mini stroke- Afib  . Irregular heart beat  Feb 18, 2012  . Renal artery stenosis     Past Surgical History  Procedure Laterality Date  . Pr vein bypass graft,aorto-fem-pop  12/23/2005    Infrarenal abdominal aortic aneurysm, right common iliac occlusive disease --   . Colon surgery      ruptured colon  . Median sternotomy    . Hand surgery  1997  . Cataract extraction      left eye  . Skin graft  1992  . Cardiac catheterization  08/26/2005    Est. EF of 65-70% -- Severe three-vessel coronary artery disease -- very heavily calcified vessels and really there is no role for angioplasty  -- he heavily calcified vessels and really  there is no role for angioplasty abdominal aortic aneurysm surgery  . Coronary artery bypass graft  09/02/2005    x 5 -- left internal mammary artery graft to the left anterior descending coronary artery, with a saphenous vein graft to the diagonal branch of the LAD, a sequential saphenous vein graft to the second and fourth obtuse marginal branches of the left circumflex coronary artery, and a saphenous vein graft to the posterior descending branch of RCA  -- Endoscopic vein harvesting from the left leg  . Laparotomy  1998  . Cardioversion  04/15/2012    Procedure: CARDIOVERSION;  Surgeon: Thayer Headings, MD;  Location: [place];  Service: Cardiovascular;  Laterality: N/A;  . Eye surgery      History  Smoking status  . Former Smoker -- 2.00 packs/day for 44 years  . Types: Cigarettes  . Quit date: 05/20/1988  Smokeless tobacco  . Former Systems developer  . Quit date: 02/11/2003    History  Alcohol Use No    Family History  Problem Relation Age of Onset  . Coronary artery disease Mother   . Coronary artery disease Father   . Diabetes Sister   . Coronary artery disease Sister     Reviw of Systems:  Reviewed in the HPI.  All other systems are negative.  Physical Exam: BP 140/64  Pulse 64  Ht 5\' 6"  (1.676 m)  Wt 168 lb (76.204 kg)  BMI 27.13 kg/m2 The patient is alert and oriented x 3.  The mood and affect are normal.   Skin: warm and dry.  Color is normal.    HEENT:   [place]/AT, no JVD, normal carotids  Lungs: clear   Heart: irregularly irregular, no murmurs    Abdomen: good BS, non tender  Extremities:  No  C/c/e  Neuro:  Non focal    ECG: Dec. 11, 2014:  Atrial fib at 67. No ST or T wave changes.   Assessment / Plan:

## 2013-10-27 NOTE — Assessment & Plan Note (Signed)
Stable, no angina. Continue same meds.

## 2013-10-27 NOTE — Assessment & Plan Note (Signed)
Stable.  Rate is well controlled. Continue xarelto 20 mg a day

## 2013-10-27 NOTE — Patient Instructions (Signed)
Your physician wants you to follow-up in: 6 months with DR. Nahser You will receive a reminder letter in the mail two months in advance. If you don't receive a letter, please call our office to schedule the follow-up appointment.   Your physician recommends that you continue on your current medications as directed. Please refer to the Current Medication list given to you today.

## 2013-12-03 ENCOUNTER — Other Ambulatory Visit: Payer: Self-pay

## 2013-12-03 ENCOUNTER — Telehealth: Payer: Self-pay

## 2013-12-03 MED ORDER — METOPROLOL SUCCINATE ER 50 MG PO TB24: 50.0000 mg | ORAL_TABLET | Freq: Every day | ORAL | Status: DC

## 2013-12-03 MED ORDER — DILTIAZEM HCL ER COATED BEADS 120 MG PO CP24: ORAL_CAPSULE | ORAL | Status: DC

## 2013-12-03 MED ORDER — RIVAROXABAN 20 MG PO TABS: ORAL_TABLET | ORAL | Status: DC

## 2013-12-03 NOTE — Telephone Encounter (Signed)
Patient called to let us know that he change his insurance to [employer] his ID # is [id number]. He also needed samples of xarelto until [facility] could [facility]. Placed them up front. [facility]

## 2013-12-17 ENCOUNTER — Other Ambulatory Visit: Payer: Self-pay | Admitting: *Deleted

## 2013-12-17 MED ORDER — ATORVASTATIN CALCIUM 80 MG PO TABS: ORAL_TABLET | ORAL | Status: DC

## 2014-01-18 ENCOUNTER — Encounter: Payer: Self-pay | Admitting: Family

## 2014-01-19 ENCOUNTER — Encounter: Payer: Self-pay | Admitting: Family

## 2014-01-19 ENCOUNTER — Ambulatory Visit (INDEPENDENT_AMBULATORY_CARE_PROVIDER_SITE_OTHER)
Admission: RE | Admit: 2014-01-19 | Discharge: 2014-01-19 | Disposition: A | Discharge status: Home / Self Care | Payer: Medicare Other | Source: Ambulatory Visit | Attending: Family | Admitting: Family

## 2014-01-19 ENCOUNTER — Ambulatory Visit (INDEPENDENT_AMBULATORY_CARE_PROVIDER_SITE_OTHER): Payer: Medicare Other | Admitting: Family

## 2014-01-19 ENCOUNTER — Ambulatory Visit (HOSPITAL_COMMUNITY)
Admission: RE | Admit: 2014-01-19 | Discharge: 2014-01-19 | Disposition: A | Discharge status: Home / Self Care | Payer: Medicare Other | Source: Ambulatory Visit | Attending: Family | Admitting: Family

## 2014-01-19 VITALS — BP 147/81 | HR 70 | Resp 16 | Ht 66.0 in | Wt 167.0 lb

## 2014-01-19 DIAGNOSIS — Z48812 Encounter for surgical aftercare following surgery on the circulatory system: Secondary | ICD-10-CM | POA: Diagnosis not present

## 2014-01-19 DIAGNOSIS — I714 Abdominal aortic aneurysm, without rupture, unspecified: Secondary | ICD-10-CM | POA: Diagnosis not present

## 2014-01-19 DIAGNOSIS — I2510 Atherosclerotic heart disease of native coronary artery without angina pectoris: Secondary | ICD-10-CM

## 2014-01-19 DIAGNOSIS — I701 Atherosclerosis of renal artery: Secondary | ICD-10-CM | POA: Insufficient documentation

## 2014-01-19 DIAGNOSIS — I6529 Occlusion and stenosis of unspecified carotid artery: Secondary | ICD-10-CM | POA: Diagnosis not present

## 2014-01-19 NOTE — Patient Instructions (Signed)
Stroke Prevention Some medical conditions and behaviors are associated with an increased chance of having a stroke. You may prevent a stroke by making healthy choices and managing medical conditions. HOW CAN I REDUCE MY RISK OF HAVING A STROKE?   Stay physically active. Get at least 30 minutes of activity on most or all days.  Do not smoke. It may also be helpful to avoid exposure to secondhand smoke.  Limit alcohol use. Moderate alcohol use is considered to be:  No more than 2 drinks per day for men.  No more than 1 drink per day for nonpregnant women.  Eat healthy foods. This involves:  Eating 5 or more servings of fruits and vegetables a day.  Making dietary changes that address high blood pressure (hypertension), high cholesterol, diabetes, or obesity.  Manage your cholesterol levels.  Making food choices that are high in fiber and low in saturated fat, trans fat, and cholesterol may control cholesterol levels.  Take any prescribed medicines to control cholesterol as directed by your health care provider.  Manage your diabetes.  Controlling your carbohydrate and sugar intake is recommended to manage diabetes.  Take any prescribed medicines to control diabetes as directed by your health care provider.  Control your hypertension.  Making food choices that are low in salt (sodium), saturated fat, trans fat, and cholesterol is recommended to manage hypertension.  Take any prescribed medicines to control hypertension as directed by your health care provider.  Maintain a healthy weight.  Reducing calorie intake and making food choices that are low in sodium, saturated fat, trans fat, and cholesterol are recommended to manage weight.  Stop drug abuse.  Avoid taking birth control pills.  Talk to your health care provider about the risks of taking birth control pills if you are over 35 years old, smoke, get migraines, or have ever had a blood clot.  Get evaluated for sleep  disorders (sleep apnea).  Talk to your health care provider about getting a sleep evaluation if you snore a lot or have excessive sleepiness.  Take medicines only as directed by your health care provider.  For some people, aspirin or blood thinners (anticoagulants) are helpful in reducing the risk of forming abnormal blood clots that can lead to stroke. If you have the irregular heart rhythm of atrial fibrillation, you should be on a blood thinner unless there is a good reason you cannot take them.  Understand all your medicine instructions.  Make sure that other conditions (such as anemia or atherosclerosis) are addressed. SEEK IMMEDIATE MEDICAL CARE IF:   You have sudden weakness or numbness of the face, arm, or leg, especially on one side of the body.  Your face or eyelid droops to one side.  You have sudden confusion.  You have trouble speaking (aphasia) or understanding.  You have sudden trouble seeing in one or both eyes.  You have sudden trouble walking.  You have dizziness.  You have a loss of balance or coordination.  You have a sudden, severe headache with no known cause.  You have new chest pain or an irregular heartbeat. Any of these symptoms may represent a serious problem that is an emergency. Do not wait to see if the symptoms will go away. Get medical help at once. Call your local emergency services (911 in [place] Do not drive yourself to the hospital. Document Released: 06/13/2004 Document Revised: 09/20/2013 Document Reviewed: 11/06/2012 ExitCare Patient Information 2015 [employer]. This information is not intended to replace advice given   to you by your health care provider. Make sure you discuss any questions you have with your health care provider.   Abdominal Aortic Aneurysm An aneurysm is a weakened or damaged part of an artery wall that bulges from the normal force of blood pumping through the body. An abdominal aortic aneurysm is an aneurysm that  occurs in the lower part of the aorta, the main artery of the body.  The major concern with an abdominal aortic aneurysm is that it can enlarge and burst (rupture) or blood can flow between the layers of the wall of the aorta through a tear (aorticdissection). Both of these conditions can cause bleeding inside the body and can be life threatening unless diagnosed and treated promptly. CAUSES  The exact cause of an abdominal aortic aneurysm is unknown. Some contributing factors are:   A hardening of the arteries caused by the buildup of fat and other substances in the lining of a blood vessel (arteriosclerosis).  Inflammation of the walls of an artery (arteritis).   Connective tissue diseases, such as Marfan syndrome.   Abdominal trauma.   An infection, such as syphilis or staphylococcus, in the wall of the aorta (infectious aortitis) caused by bacteria. RISK FACTORS  Risk factors that contribute to an abdominal aortic aneurysm may include:  Age older than 58 years.   High blood pressure (hypertension).  Male gender.  Ethnicity (white race).  Obesity.  Family history of aneurysm (first degree relatives only).  Tobacco use. PREVENTION  The following healthy lifestyle habits may help decrease your risk of abdominal aortic aneurysm:  Quitting smoking. Smoking can raise your blood pressure and cause arteriosclerosis.  Limiting or avoiding alcohol.  Keeping your blood pressure, blood sugar level, and cholesterol levels within normal limits.  Decreasing your salt intake. In somepeople, too much salt can raise blood pressure and increase your risk of abdominal aortic aneurysm.  Eating a diet low in saturated fats and cholesterol.  Increasing your fiber intake by including whole grains, vegetables, and fruits in your diet. Eating these foods may help lower blood pressure.  Maintaining a healthy weight.  Staying physically active and exercising regularly. SYMPTOMS  The  symptoms of abdominal aortic aneurysm may vary depending on the size and rate of growth of the aneurysm.Most grow slowly and do not have any symptoms. When symptoms do occur, they may include:  Pain (abdomen, side, lower back, or groin). The pain may vary in intensity. A sudden onset of severe pain may indicate that the aneurysm has ruptured.  Feeling full after eating only small amounts of food.  Nausea or vomiting or both.  Feeling a pulsating lump in the abdomen.  Feeling faint or passing out. DIAGNOSIS  Since most unruptured abdominal aortic aneurysms have no symptoms, they are often discovered during diagnostic exams for other conditions. An aneurysm may be found during the following procedures:  Ultrasonography (A one-time screening for abdominal aortic aneurysm by ultrasonography is also recommended for all men aged 5-75 years who have ever smoked).  X-ray exams.  A computed tomography (CT).  Magnetic resonance imaging (MRI).  Angiography or arteriography. TREATMENT  Treatment of an abdominal aortic aneurysm depends on the size of your aneurysm, your age, and risk factors for rupture. Medication to control blood pressure and pain may be used to manage aneurysms smaller than 6 cm. Regular monitoring for enlargement may be recommended by your caregiver if:  The aneurysm is 3-4 cm in size (an annual ultrasonography may be recommended).  The  The aneurysm is 4-4.5 cm in size (an ultrasonography every 6 months may be recommended).  The aneurysm is larger than 4.5 cm in size (your caregiver may ask that you be examined by a vascular surgeon). If your aneurysm is larger than 6 cm, surgical repair may be recommended. There are two main methods for repair of an aneurysm:   Endovascular repair (a minimally invasive surgery). This is done most often.  Open repair. This method is used if an endovascular repair is not possible. Document Released: 02/13/2005 Document Revised: 08/31/2012  Document Reviewed: 06/05/2012 ExitCare Patient Information 2015 [employer]. This information is not intended to replace advice given to you by your health care provider. Make sure you discuss any questions you have with your health care provider.  

## 2014-01-19 NOTE — Progress Notes (Signed)
[facility] HISTORY AND PHYSICAL   MRN : [id number]  History of Present Illness:   Gabriel Gutierrez is a 78 y.o. male patient of Dr. Kellie Simmering who presents with chief complaint: follow up on renal artery stenosis and s/p aortobiiliac [name] Zenith stent graft placed by Dr. Kellie Simmering in 2007 for abdominal aortic aneurysm. [name]  Previous (02/05/2011) CTA abdomen/pelvis: Developing moderately severe ostial stenosis of the left renal  artery. Additionally, there is been an interval 0.5 cm decrease in  size of the left kidney since the prior study suggesting that this  stenosis may be hemodynamically significant.  He quit smoking in 1990.  He does have DM, well controlled.  He states his blood pressure has not been high. He denies back or abdominal pain.  The patient's blood pressure has been stable. The patient's blood pressure medication regimen has remained stable. The patient's urinary history has remained stable, he does seem to have BPH, Flomax helps.   He had a stroke October, 2013, has a-fib. for which he is taking Jennye Moccasin, now prescribed by Dr. Acie Fredrickson, his cardiologist.  His left side weakness has resolved but still has left side facial weakness with smiling but no noticeable facial droop otherwise.   He states his left knee given him problems with walking, but plays 18 holes weekly, does not have claudication symptoms with walking, denies non healing wounds.   Pt smoker: former smoker, quit in 1990  Pt meds include: Statin :Yes Betablocker: Yes ASA: No Other anticoagulants/antiplatelets: Xaralto for atrial fib  Current Outpatient Prescriptions  Medication Sig Dispense Refill  . atorvastatin (LIPITOR) 80 MG tablet TAKE 1 TABLET DAILY  90 tablet  1  . diltiazem (CARDIZEM CD) 120 MG 24 hr capsule TAKE 1 CAPSULE DAILY  90 capsule  3  . metFORMIN (GLUCOPHAGE) 500 MG tablet Take 500 mg by mouth 2 (two) times daily with a meal.        . metoprolol succinate  (TOPROL-XL) 50 MG 24 hr tablet Take 1 tablet (50 mg total) by mouth daily. Take with or immediately following a meal.  90 tablet  3  . rivaroxaban (XARELTO) 20 MG TABS tablet TAKE 1 TABLET DAILY WITH SUPPER  90 tablet  3  . tamsulosin (FLOMAX) 0.4 MG CAPS capsule 0.4 mg daily.        No current facility-administered medications for this visit.    Past Medical History  Diagnosis Date  . Diabetes mellitus Age 34  . Hyperlipidemia   . AAA (abdominal aortic aneurysm)   . Hypertension   . Coronary artery disease     post CABG x5 --Severe three-vessel coronary disease  . Arthritis   . Stroke Oct. 1, 2013    Mini stroke- Afib  . Irregular heart beat  Feb 18, 2012  . Renal artery stenosis   . Atrial fibrillation     Social History History  Substance Use Topics  . Smoking status: Former Smoker -- 2.00 packs/day for 44 years    Types: Cigarettes    Quit date: 05/20/1988  . Smokeless tobacco: Former Systems developer    Quit date: 02/11/2003  . Alcohol Use: No    Family History Family History  Problem Relation Age of Onset  . Coronary artery disease Mother   . Heart disease Mother     After age 71-Carotid  . Hyperlipidemia Mother   . Coronary artery disease Father   . Hyperlipidemia Father   . Hypertension Father   .  Varicose Veins Father   . Heart attack Father   . Coronary artery disease Sister   . Heart disease Sister     After age 66- BP  . Heart attack Sister   . Diabetes Brother   . Heart disease Brother     After 78 yrs of age  . Heart attack Brother   . Alzheimer's disease Sister     Surgical History Past Surgical History  Procedure Laterality Date  . Pr vein bypass graft,aorto-fem-pop  12/23/2005    Infrarenal abdominal aortic aneurysm, right common iliac occlusive disease --   . Colon surgery      ruptured colon  . Median sternotomy    . Hand surgery  1997  . Cataract extraction      left eye  . Skin graft  1992  . Cardiac catheterization  08/26/2005    Est. EF  of 65-70% -- Severe three-vessel coronary artery disease -- very heavily calcified vessels and really there is no role for angioplasty  -- he heavily calcified vessels and really there is no role for angioplasty abdominal aortic aneurysm surgery  . Coronary artery bypass graft  09/02/2005    x 5 -- left internal mammary artery graft to the left anterior descending coronary artery, with a saphenous vein graft to the diagonal branch of the LAD, a sequential saphenous vein graft to the second and fourth obtuse marginal branches of the left circumflex coronary artery, and a saphenous vein graft to the posterior descending branch of RCA  -- Endoscopic vein harvesting from the left leg  . Laparotomy  1998  . Cardioversion  04/15/2012    Procedure: CARDIOVERSION;  Surgeon: Thayer Headings, MD;  Location: [facility];  Service: Cardiovascular;  Laterality: N/A;  . Eye surgery Left     Cataract  . Eye surgery Right     Cataract    No Known Allergies  Current Outpatient Prescriptions  Medication Sig Dispense Refill  . atorvastatin (LIPITOR) 80 MG tablet TAKE 1 TABLET DAILY  90 tablet  1  . diltiazem (CARDIZEM CD) 120 MG 24 hr capsule TAKE 1 CAPSULE DAILY  90 capsule  3  . metFORMIN (GLUCOPHAGE) 500 MG tablet Take 500 mg by mouth 2 (two) times daily with a meal.        . metoprolol succinate (TOPROL-XL) 50 MG 24 hr tablet Take 1 tablet (50 mg total) by mouth daily. Take with or immediately following a meal.  90 tablet  3  . rivaroxaban (XARELTO) 20 MG TABS tablet TAKE 1 TABLET DAILY WITH SUPPER  90 tablet  3  . tamsulosin (FLOMAX) 0.4 MG CAPS capsule 0.4 mg daily.        No current facility-administered medications for this visit.     REVIEW OF SYSTEMS: See HPI for pertinent positives and negatives.  Physical Examination Filed Vitals:   01/19/14 1023  BP: 147/81  Pulse: 70  Resp: 16  Height: 5\' 6"  (1.676 m)  Weight: 167 lb (75.751 kg)  SpO2: 100%   Body mass index is 26.97  kg/(m^2).  General: A&O x 3, WD.  Pulmonary: Sym exp, good air movt, CTAB, no rales, rhonchi, & wheezing.  Cardiac: Irregular rhythm and rate, no peripheral edema.  Vascular:  Vessel  Right  Left   Radial  Palpable  Palpable   Carotid  audible, without bruit  audible, without bruit   Aorta  Not palpable  N/A   Femoral  Palpable  Palpable   Popliteal  Not palpable  Not palpable   PT  Palpable  Palpable   DP  Palpable  Palpable    Gastrointestinal: soft, NTND, -G/R, - HSM, + mid abdominal incisional hernia that is reducible, - CVAT B.  Musculoskeletal: M/S 5/5 throughout, Extremities without ischemic changes.  Neurologic: Pain and light touch intact in extremities, Motor exam as listed above. 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   Non-Invasive Vascular Imaging (01/19/2014):  RENAL ARTERY DUPLEX EVALUATION    INDICATION: Left renal artery stenosis    PREVIOUS INTERVENTION(S): Endovascular aortic repair in 2007    DUPLEX EXAM:     AORTA Peak Systolic Velocity (cm/s): 57    RIGHT  LEFT   Peak Systolic Velocities (cm/s) Comments  Peak Systolic Velocities (cm/s) Comments  163  Renal Artery Origin/Proximal 203   116  Renal Artery Mid 75   85  Renal Artery Distal 35     Accessory Renal Artery (when present)    2.9  Renal / Aortic Ratio (RAR) 3.6  11.5 Kidney Size (cm) 10.5  Patent  Renal Vein Patent    ADDITIONAL FINDINGS:   Decreased visualization of the abdominal vasculature due to overlying bowel gas.   The bilateral kidney length measurements are within normal limits.   Patent aortic stent with diameter measurements of 3.1 x 3.4cm of the distal abdominal aorta with a velocity of 61cm/s. The bilateral common iliac arteries were not adequately visualized.    IMPRESSION: 1. Doppler velocities and  renal-aortic ratios suggest a greater than 60% stenosis of the left proximal renal artery and no stenosis of greater than 60% of the right renal artery. 2. Patent aortic stent with diameters as described above.    Compared to the previous exam:  Significant change in the percent of stenosis of the bilateral renal arteries when compared to the previous exam on 08/31/13. Maximum diameter of the abdominal aorta on 02/15/13 was 3.3cm.    CAROTID DUPLEX 02/22/2013  <40% stenosis of bilateral ICA's, bilateral ICA's are tortuous.  October, 2013 carotid artery Duplex at [facility] that showed  no significant ICA stenosis noted on right. Lt ICA velocities in the range of 60 to 79% stenosis. This reading is at an area of slight tortuosity which may falsely elevate the velocities.  These findings are Improved from previous exam.  CTA Abd/Pelvis Duplex (Date: 02/05/2011)  The treated aortic aneurysm continues to  decrease in size measuring 3.0 x 2.2 cm in maximal diameter compared  to 3.3 x 2.2 cm on the prior study.  1. Stable appearance of successfully treated infrarenal abdominal  aortic aneurysm status post EVAR with a Cook Zenith bifurcated  endoprosthesis. The excluded aneurysm sac measures slightly  smaller in size compared to the prior study. There is no evidence  of endoleak.  2. 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 Additionally, there is been an interval 0.5 cm decrease in  size of the left kidney since the prior study suggesting that this  stenosis may be hemodynamically significant.  3. Mild interval progression in the size of the right internal  iliac artery aneurysm which now measures up to 13 mm compared to 10  mm on the prior study.    ASSESSMENT:  Gabriel Gutierrez is a 78 y.o. male  Who is followed  for renal artery stenosis and is s/p aortobiiliac Cook Zenith stent graft placed by Dr. Kellie Simmering in 2007 for abdominal aortic aneurysm. He also has a  history of minimal carotid artery stenosis and atrial fib. His blood pressure remains in good control, he has no abdominal or back pain. Renal artery Duplex today demonstrates normal sized kidneys, a patent aortic stent with diameter measurements of 3.1 x 3.4cm, and increased stenosis of the bilateral renal arteries when compared to the previous exam on 08/31/13.  This was discussed with Dr. Scot Dock. The patient leads an active lifestyle and his atherosclerotic risk factors all seem to be maximally addressed.   PLAN:   Based on today's exam and non-invasive vascular lab results, and after discussing with Dr. Scot Dock, the patient will follow up in 6 months with the following tests bilateral renal artery Duplex, carotid Duplex.  I discussed in depth with the patient the nature of atherosclerosis, and emphasized the importance of maximal medical management including strict control of blood pressure, blood glucose, and lipid levels, obtaining regular exercise, and cessation of smoking.  The patient is aware that without maximal medical management the underlying atherosclerotic disease process will progress, limiting the benefit of any interventions. The patient was given information about stroke prevention and what symptoms should prompt the patient to seek immediate medical care. The patient was given information about AAA including signs, symptoms, treatment,  what symptoms should prompt the patient to seek immediate medical care, and how to minimize the risk of enlargement and rupture of aneurysms.  Thank you for allowing [place] to participate in this patient's care.  Clemon Chambers, RN, MSN, FNP-C [facility] Office: [phone]  Clinic MD: Scot Dock 01/19/2014 10:37 AM

## 2014-01-19 NOTE — Addendum Note (Signed)
Addended by: Mena Goes on: 01/19/2014 04:08 PM   Modules accepted: Orders

## 2014-01-31 ENCOUNTER — Telehealth: Payer: Self-pay | Admitting: Neurology

## 2014-01-31 NOTE — Telephone Encounter (Signed)
Patient's wife calling to state that patient is starting to have vision problems, states that he had a stroke back in October of 2013 but patient never followed up with [place] at the office after the hospital, patient's wife requesting an appointment, please call and advise.

## 2014-02-02 ENCOUNTER — Ambulatory Visit (INDEPENDENT_AMBULATORY_CARE_PROVIDER_SITE_OTHER): Payer: Medicare Other | Admitting: Neurology

## 2014-02-02 ENCOUNTER — Encounter: Payer: Self-pay | Admitting: Neurology

## 2014-02-02 VITALS — BP 105/55 | HR 65 | Ht 65.0 in | Wt 169.4 lb

## 2014-02-02 DIAGNOSIS — I6529 Occlusion and stenosis of unspecified carotid artery: Secondary | ICD-10-CM | POA: Diagnosis not present

## 2014-02-02 DIAGNOSIS — H53139 Sudden visual loss, unspecified eye: Secondary | ICD-10-CM

## 2014-02-02 DIAGNOSIS — H53131 Sudden visual loss, right eye: Secondary | ICD-10-CM

## 2014-02-02 DIAGNOSIS — R413 Other amnesia: Secondary | ICD-10-CM | POA: Diagnosis not present

## 2014-02-02 NOTE — Telephone Encounter (Signed)
Called patient and gave them an appointment for  2 noon with Dr. Leonie Man on 02-02-2014

## 2014-02-02 NOTE — Progress Notes (Signed)
[facility] [address] [place]. [place] [address] [phone]       OFFICE CONSULT NOTE  [name] Date of Birth:  Jul 24, 1935 Medical Record Number:  [id number]   Referring MD:  Reinaldo Meeker  Reason for Referral:  Vision and memory loss  HPI: 78 year male who had a transient episode of vision loss in the right eye on 01/28/14. He noticed cloudiness which she describes as a horizontal line in his right eye and he could not see below the line the bottom half but he was able to see above it. This lasted only a few minutes and recovered. He denied any accompanying of following headache for seeing zigzag patterns or lines. He is dizzy had a similar episode in November 2014 when he had bilateral vision loss involving the lower half of the field which also lasted only a minute or so. He did not seek any medical help her evaluation in that time period. She denies any chronic headaches, muscle aches, pain, scalp tenderness or jaw claudication. He was seen by me in October 2013 when he had a right parietal insular cortex infarct and was diagnosed with new-onset atrial fibrillation. He is done well from that stroke and recovered completely. He has been on Xarelto of and has been tolerated well without bleeding or bruising. He is in a complain of memory loss particularly not remembering dates and names as well as his hands. He gets frustrated from this and got treated recently. He however remains independent of activities of daily living. His wife handles the finances but the patient can drive and manage his own affairs. He states his blood pressure control has been quite good and his fasting sugars usually run below 120.  ROS:   14 system review of systems is positive for hearing loss, frequency of urination, joint pain, neck pain, daytime sleepiness, memory loss, agitation  PMH:  Past Medical History  Diagnosis Date  . Diabetes mellitus Age 88  . Hyperlipidemia   .  AAA (abdominal aortic aneurysm)   . Hypertension   . Coronary artery disease     post CABG x5 --Severe three-vessel coronary disease  . Arthritis   . Stroke Oct. 1, 2013    Mini stroke- Afib  . Irregular heart beat  Feb 18, 2012  . Renal artery stenosis   . Atrial fibrillation     Social History:  History   Social History  . Marital Status: Married    Spouse Name: N/A    Number of Children: N/A  . Years of Education: N/A   Occupational History  . Not on file.   Social History Main Topics  . Smoking status: Former Smoker -- 2.00 packs/day for 44 years    Types: Cigarettes    Quit date: 05/20/1988  . Smokeless tobacco: Former Systems developer    Quit date: 02/11/2003  . Alcohol Use: No  . Drug Use: No  . Sexual Activity: No   Other Topics Concern  . Not on file   Social History Narrative  . No narrative on file    Medications:   Current Outpatient Prescriptions on File Prior to Visit  Medication Sig Dispense Refill  . atorvastatin (LIPITOR) 80 MG tablet TAKE 1 TABLET DAILY  90 tablet  1  . diltiazem (CARDIZEM CD) 120 MG 24 hr capsule TAKE 1 CAPSULE DAILY  90 capsule  3  . metFORMIN (GLUCOPHAGE) 500 MG tablet Take 500 mg by mouth 2 (two) times  daily with a meal.        . metoprolol succinate (TOPROL-XL) 50 MG 24 hr tablet Take 1 tablet (50 mg total) by mouth daily. Take with or immediately following a meal.  90 tablet  3  . rivaroxaban (XARELTO) 20 MG TABS tablet TAKE 1 TABLET DAILY WITH SUPPER  90 tablet  3  . tamsulosin (FLOMAX) 0.4 MG CAPS capsule 0.4 mg daily.        No current facility-administered medications on file prior to visit.    Allergies:  No Known Allergies  Physical Exam General: well developed, well nourished elderly male, seated, in no evident distress Head: head normocephalic and atraumatic. Orohparynx benign Neck: supple with no carotid or supraclavicular bruits Cardiovascular: regular rate and rhythm, no murmurs Musculoskeletal: no deformity Skin:   no rash/petichiae Vascular:  Normal pulses all extremities Filed Vitals:   02/02/14 1407  BP: 105/55  Pulse: 65    Neurologic Exam Mental Status: Awake and fully alert. Oriented to place and time. Recent and remote memory diminished. Attention span, concentration and fund of knowledge appropriate. Mood and affect appropriate.  Cranial Nerves: Fundoscopic exam reveals sharp disc margins. Pupils equal, briskly reactive to light. Extraocular movements full without nystagmus. Visual fields full to confrontation. Hearing diminished bilaterally. Facial sensation intact. Face, tongue, palate moves normally and symmetrically.  Motor: Normal bulk and tone. Normal strength in all tested extremity muscles. Sensory.: intact to touch and pinprick and vibratory sensation.  Coordination: Rapid alternating movements normal in all extremities. Finger-to-nose and heel-to-shin performed accurately bilaterally. Gait and Station: Arises from chair without difficulty. Stance is normal. Gait demonstrates normal stride length and balance . Unable to heel, toe and tandem walk without difficulty.  Reflexes: 1+ and symmetric. Toes downgoing.   NIHSS  0 Modified Rankin  1   ASSESSMENT: 78 year male with transient episodes of visual loss of unclear etiology possibly small vessel retinal TIAs. She complains of memory and cognitive difficulties likely from mild cognitive impairment but strong family history of Alzheimer's and is concerning. Remote history of embolic right MCA branch infarct in October 2013 from atrial fibrillation with good neurological recovery    PLAN: I had a long discussion with the patient and his wife regarding his episode of transient vision loss which may be related to small vessel retinal ischemia. I advised him to followup with his ophthalmologist and he has an appointment in 2 weeks. We also discussed his family history of Alzheimer's, recent complaints of memory loss plan for evaluation and  treatment and answered questions. Check vitamin B12, TSH, RPR, EEG and MRI scan of the brain with MRA of the brain and neck. Continue Xarelto for secondary stroke prevention given history of atrial fibrillation and maintain strict control of diabetes with hemoglobin A1c total 6.5, lipids and LDL cholesterol goal below 70 mg percent and hypertension with blood pressure goal below 120/80. I also advised him to start taking Cerefolin nac daily and increase participation in  cognitively challenging task like playing bridge, crossword puzzles and sudoku. He was advised to keep his upcoming appointment with ophthalmologist for detailed retinal exam. Return for followup in 2 months or earlier if necessary.   Note: This document was prepared with digital dictation and possible smart phrase technology. Any transcriptional errors that result from this process are unintentional.

## 2014-02-02 NOTE — Patient Instructions (Addendum)
I had a long discussion with the patient and his wife regarding his episode of transient vision loss which may be related to small vessel retinal ischemia. I advised him to followup with his ophthalmologist and he has an appointment in 2 weeks. We also discussed his family history of Alzheimer's, recent complaints of memory loss plan for evaluation and treatment and answered questions. Check vitamin B12, TSH, RPR, EEG and MRI scan of the brain with MRA of the brain and neck. Continue Xarelto for secondary stroke prevention given history of atrial fibrillation and maintain strict control of diabetes with hemoglobin A1c total 6.5, lipids and LDL cholesterol goal below 70 mg percent and hypertension with blood pressure goal below 120/80. I also advised him to start taking Cerefolin nac daily and increase participation in  cognitively challenging task like playing bridge, crossword puzzles and sudoku.He was advised to keep his upcoming appointment with ophthalmologist for detailed retinal exam. Return for followup in 2 months or earlier if necessary. Stroke Prevention Some medical conditions and behaviors are associated with an increased chance of having a stroke. You may prevent a stroke by making healthy choices and managing medical conditions. HOW CAN I REDUCE MY RISK OF HAVING A STROKE?   Stay physically active. Get at least 30 minutes of activity on most or all days.  Do not smoke. It may also be helpful to avoid exposure to secondhand smoke.  Limit alcohol use. Moderate alcohol use is considered to be:  No more than 2 drinks per day for men.  No more than 1 drink per day for nonpregnant women.  Eat healthy foods. This involves:  Eating 5 or more servings of fruits and vegetables a day.  Making dietary changes that address high blood pressure (hypertension), high cholesterol, diabetes, or obesity.  Manage your cholesterol levels.  Making food choices that are high in fiber and low in  saturated fat, trans fat, and cholesterol may control cholesterol levels.  Take any prescribed medicines to control cholesterol as directed by your health care provider.  Manage your diabetes.  Controlling your carbohydrate and sugar intake is recommended to manage diabetes.  Take any prescribed medicines to control diabetes as directed by your health care provider.  Control your hypertension.  Making food choices that are low in salt (sodium), saturated fat, trans fat, and cholesterol is recommended to manage hypertension.  Take any prescribed medicines to control hypertension as directed by your health care provider.  Maintain a healthy weight.  Reducing calorie intake and making food choices that are low in sodium, saturated fat, trans fat, and cholesterol are recommended to manage weight.  Stop drug abuse.  Avoid taking birth control pills.  Talk to your health care provider about the risks of taking birth control pills if you are over 73 years old, smoke, get migraines, or have ever had a blood clot.  Get evaluated for sleep disorders (sleep apnea).  Talk to your health care provider about getting a sleep evaluation if you snore a lot or have excessive sleepiness.  Take medicines only as directed by your health care provider.  For some people, aspirin or blood thinners (anticoagulants) are helpful in reducing the risk of forming abnormal blood clots that can lead to stroke. If you have the irregular heart rhythm of atrial fibrillation, you should be on a blood thinner unless there is a good reason you cannot take them.  Understand all your medicine instructions.  Make sure that other conditions (such as anemia or  atherosclerosis) are addressed. SEEK IMMEDIATE MEDICAL CARE IF:   You have sudden weakness or numbness of the face, arm, or leg, especially on one side of the body.  Your face or eyelid droops to one side.  You have sudden confusion.  You have trouble  speaking (aphasia) or understanding.  You have sudden trouble seeing in one or both eyes.  You have sudden trouble walking.  You have dizziness.  You have a loss of balance or coordination.  You have a sudden, severe headache with no known cause.  You have new chest pain or an irregular heartbeat. Any of these symptoms may represent a serious problem that is an emergency. Do not wait to see if the symptoms will go away. Get medical help at once. Call your local emergency services (911 in [place] Do not drive yourself to the hospital. Document Released: 06/13/2004 Document Revised: 09/20/2013 Document Reviewed: 11/06/2012 [facility] Patient Information 2015 [place], [place]. This information is not intended to replace advice given to you by your health care provider. Make sure you discuss any questions you have with your health care provider.

## 2014-02-03 LAB — TSH: TSH: 2.92 u[IU]/mL (ref 0.450–4.500)

## 2014-02-03 LAB — VITAMIN B12: Vitamin B-12: 402 pg/mL (ref 211–946)

## 2014-02-03 LAB — RPR: SYPHILIS RPR SCR: NONREACTIVE

## 2014-02-09 ENCOUNTER — Other Ambulatory Visit (INDEPENDENT_AMBULATORY_CARE_PROVIDER_SITE_OTHER): Payer: Medicare Other

## 2014-02-09 DIAGNOSIS — R413 Other amnesia: Secondary | ICD-10-CM | POA: Diagnosis not present

## 2014-02-11 ENCOUNTER — Ambulatory Visit
Admission: RE | Admit: 2014-02-11 | Discharge: 2014-02-11 | Disposition: A | Discharge status: Home / Self Care | Payer: Medicare Other | Source: Ambulatory Visit | Attending: Neurology | Admitting: Neurology

## 2014-02-11 DIAGNOSIS — H53131 Sudden visual loss, right eye: Secondary | ICD-10-CM

## 2014-02-11 DIAGNOSIS — H538 Other visual disturbances: Secondary | ICD-10-CM | POA: Diagnosis not present

## 2014-02-11 DIAGNOSIS — R413 Other amnesia: Secondary | ICD-10-CM

## 2014-02-11 DIAGNOSIS — H5310 Unspecified subjective visual disturbances: Secondary | ICD-10-CM | POA: Diagnosis not present

## 2014-02-11 MED ORDER — GADOBENATE DIMEGLUMINE 529 MG/ML IV SOLN
15.0000 mL | Freq: Once | INTRAVENOUS | Status: AC | PRN
  Administered 2014-02-11: 15 mL via INTRAVENOUS

## 2014-02-17 DIAGNOSIS — E119 Type 2 diabetes mellitus without complications: Secondary | ICD-10-CM | POA: Diagnosis not present

## 2014-02-28 ENCOUNTER — Other Ambulatory Visit (HOSPITAL_COMMUNITY): Payer: Medicare Other

## 2014-02-28 ENCOUNTER — Ambulatory Visit: Payer: Medicare Other | Admitting: Family

## 2014-03-08 DIAGNOSIS — I4891 Unspecified atrial fibrillation: Secondary | ICD-10-CM | POA: Diagnosis not present

## 2014-03-08 DIAGNOSIS — E785 Hyperlipidemia, unspecified: Secondary | ICD-10-CM | POA: Diagnosis not present

## 2014-03-08 DIAGNOSIS — Z6829 Body mass index (BMI) 29.0-29.9, adult: Secondary | ICD-10-CM | POA: Diagnosis not present

## 2014-03-08 DIAGNOSIS — I6340 Cerebral infarction due to embolism of unspecified cerebral artery: Secondary | ICD-10-CM | POA: Diagnosis not present

## 2014-03-08 DIAGNOSIS — I10 Essential (primary) hypertension: Secondary | ICD-10-CM | POA: Diagnosis not present

## 2014-03-08 DIAGNOSIS — E119 Type 2 diabetes mellitus without complications: Secondary | ICD-10-CM | POA: Diagnosis not present

## 2014-03-08 DIAGNOSIS — Z23 Encounter for immunization: Secondary | ICD-10-CM | POA: Diagnosis not present

## 2014-03-09 DIAGNOSIS — Z1211 Encounter for screening for malignant neoplasm of colon: Secondary | ICD-10-CM | POA: Diagnosis not present

## 2014-03-18 DIAGNOSIS — E876 Hypokalemia: Secondary | ICD-10-CM | POA: Diagnosis not present

## 2014-03-18 DIAGNOSIS — E875 Hyperkalemia: Secondary | ICD-10-CM | POA: Diagnosis not present

## 2014-03-28 DIAGNOSIS — E876 Hypokalemia: Secondary | ICD-10-CM | POA: Diagnosis not present

## 2014-03-28 DIAGNOSIS — E875 Hyperkalemia: Secondary | ICD-10-CM | POA: Diagnosis not present

## 2014-04-12 DIAGNOSIS — Z1211 Encounter for screening for malignant neoplasm of colon: Secondary | ICD-10-CM | POA: Diagnosis not present

## 2014-04-12 DIAGNOSIS — Z8719 Personal history of other diseases of the digestive system: Secondary | ICD-10-CM | POA: Diagnosis not present

## 2014-04-25 ENCOUNTER — Ambulatory Visit (INDEPENDENT_AMBULATORY_CARE_PROVIDER_SITE_OTHER): Payer: Medicare Other | Admitting: Cardiovascular Disease

## 2014-04-25 ENCOUNTER — Encounter: Payer: Self-pay | Admitting: Cardiovascular Disease

## 2014-04-25 VITALS — BP 130/80 | HR 60 | Ht 66.0 in | Wt 169.0 lb

## 2014-04-25 DIAGNOSIS — I6529 Occlusion and stenosis of unspecified carotid artery: Secondary | ICD-10-CM | POA: Diagnosis not present

## 2014-04-25 DIAGNOSIS — I482 Chronic atrial fibrillation, unspecified: Secondary | ICD-10-CM

## 2014-04-25 DIAGNOSIS — I2510 Atherosclerotic heart disease of native coronary artery without angina pectoris: Secondary | ICD-10-CM | POA: Diagnosis not present

## 2014-04-25 DIAGNOSIS — E785 Hyperlipidemia, unspecified: Secondary | ICD-10-CM

## 2014-04-25 DIAGNOSIS — I10 Essential (primary) hypertension: Secondary | ICD-10-CM

## 2014-04-25 LAB — BASIC METABOLIC PANEL
BUN: 20 mg/dL (ref 6–23)
CALCIUM: 9 mg/dL (ref 8.4–10.5)
CO2: 26 mEq/L (ref 19–32)
CREATININE: 1.1 mg/dL (ref 0.4–1.5)
Chloride: 107 mEq/L (ref 96–112)
GFR: 69.12 mL/min (ref >60.00)
Glucose, Bld: 110 mg/dL — ABNORMAL HIGH (ref 70–99)
Potassium: 4.7 mEq/L (ref 3.5–5.1)
Sodium: 139 mEq/L (ref 135–145)

## 2014-04-25 NOTE — Assessment & Plan Note (Signed)
Stable, no CP Doing well

## 2014-04-25 NOTE — Progress Notes (Signed)
Gabriel Gutierrez Date of Birth  1972-07-23 [facility] [address]    [place], [place]  [address] [phone]  Fax  [phone]  Problem list: 1. Coronary artery disease-status post CABG 2. Abdominal aortic aneurysm-Status post stent grafting 3. Diabetes mellitus 4. Dyslipidemia 5. Hypertension 6. Atrial fibrillation -  We have tried cardioversion in the past but it lasted only 1 week.  7. History of stroke  History of Present Illness:  Gabriel Gutierrez is a 78 yo gentleman with a history of coronary artery disease-status post coronary artery bypass grafting in 2007. He has a history of hypertension.   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    He was recently admitted to the hospital with an acute stroke. He was also found to have rapid atrial fibrillation. He stroke symptoms resolve fairly quickly and he does not have much of the deficit. He remains in atrial fibrillation.  He's been maintained on Xarelto.  He's completely asymptomatic from a cardiac standpoint. He cannot tell that his heart rate is beating irregularly.  Dec. 19, 2013 He had a cardioversion performed last month by Dr. Irish Lack who was kind enough to perform the cardioversion after I got tied up in the office. He is completely asymptomatic. He cannot tell whether his heart is in atrial fibrillation or normal sinus rhythm. He has been able to do all of his normal activities without difficulty.  August 10, 2012:  Gabriel Gutierrez is doing well.  His potassium levels have been running high. He had blood checked at his medical doctors office in February and his potassium level was 5.8. He discontinued the benazepril / HCT. Recheck potassium was even higher. He had it checked again this week with results are pending.  He is cutting out all high potassium foods.   He had a cardioversion in   Dec.  11, 2014:  Gabriel Gutierrez is doing well.  Active , works hard every day, no CP.  No dyspnea.  Sleeping well at night.  Completely asymptomatic from an a-fib standpoint.   October 27, 2013: Gabriel Gutierrez is doing ok.    Not able to do as much as he would like.  Still very active - plays golf,  Walking some .  Has some knee problems that limits him.    Dec. 7, 2015: Gabriel Gutierrez is seen today for follow up of his CAD, AAA repair, atrial fib., and dyslipidemia.  He also has a hx of DM and a CVA.     Current Outpatient Prescriptions on File Prior to Visit  Medication Sig Dispense Refill  . atorvastatin (LIPITOR) 80 MG tablet TAKE 1 TABLET DAILY 90 tablet 1  . diltiazem (CARDIZEM CD) 120 MG 24 hr capsule TAKE 1 CAPSULE DAILY 90 capsule 3  . metFORMIN (GLUCOPHAGE) 500 MG tablet Take 500 mg by mouth 2 (two) times daily with a meal.      . metoprolol succinate (TOPROL-XL) 50 MG 24 hr tablet Take 1 tablet (50 mg total) by mouth daily. Take with or immediately following a meal. 90 tablet 3  . rivaroxaban (XARELTO) 20 MG TABS tablet TAKE 1 TABLET DAILY WITH SUPPER 90 tablet 3  . tamsulosin (FLOMAX) 0.4 MG CAPS capsule 0.4 mg daily.      No current facility-administered medications on file prior to visit.    No Known Allergies  Past Medical History  Diagnosis Date  . Diabetes mellitus Age 78  . Hyperlipidemia   . AAA (abdominal aortic aneurysm)   . Hypertension   . Coronary artery disease     post CABG x5 --Severe three-vessel coronary disease  . Arthritis   . Stroke Oct. 1, 2013    Mini stroke- Afib  . Irregular heart beat  Feb 18, 2012  . Renal artery stenosis   . Atrial fibrillation     Past Surgical History  Procedure Laterality Date  . Pr vein bypass graft,aorto-fem-pop  12/23/2005    Infrarenal abdominal aortic aneurysm, right common iliac occlusive disease --   . Colon surgery      ruptured colon  . Median sternotomy    . Hand surgery  1997  . Cataract extraction      left eye  . Skin graft  1992  .  Cardiac catheterization  08/26/2005    Est. EF of 65-70% -- Severe three-vessel coronary artery disease -- very heavily calcified vessels and really there is no role for angioplasty  -- he heavily calcified vessels and really there is no role for angioplasty abdominal aortic aneurysm surgery  . Coronary artery bypass graft  09/02/2005    x 5 -- left internal mammary artery graft to the left anterior descending coronary artery, with a saphenous vein graft to the diagonal branch of the LAD, a sequential saphenous vein graft to the second and fourth obtuse marginal branches of the left circumflex coronary artery, and a saphenous vein graft to the posterior descending branch of RCA  -- Endoscopic vein harvesting from the left leg  . Laparotomy  1998  . Cardioversion  04/15/2012    Procedure: CARDIOVERSION;  Surgeon: Thayer Headings, MD;  Location: [facility];  Service: Cardiovascular;  Laterality: N/A;  . Eye surgery Left     Cataract  . Eye surgery Right     Cataract    History  Smoking status  . Former Smoker -- 2.00 packs/day for 44 years  . Types: Cigarettes  . Quit date: 05/20/1988  Smokeless tobacco  . Former Systems developer  . Quit date: 02/11/2003    History  Alcohol Use No    Family History  Problem Relation Age of Onset  . Coronary artery disease Mother   . Heart disease Mother     After age 48-Carotid  . Hyperlipidemia Mother   . Coronary artery disease Father   . Hyperlipidemia Father   . Hypertension Father   . Varicose Veins Father   . Heart attack Father   . Coronary artery disease Sister   . Heart disease Sister     After age 52- BP  . Heart attack Sister   . Diabetes Brother   . Heart disease Brother     After 33 yrs of age  . Heart attack Brother   . Alzheimer's disease Sister     Reviw of Systems:  Reviewed in the HPI.  All other systems are negative.  Physical Exam: BP 130/80 mmHg  Pulse 60  Ht 5\' 6"  (1.676 m)  Wt 169 lb (76.658 kg)  BMI 27.29  kg/m2 The patient is alert and oriented x 3.  The mood and affect are normal.   Skin: warm and dry.  Color is normal.    HEENT:   [place]/AT, no JVD, normal carotids  Lungs: clear   Heart: irregularly irregular, no murmurs    Abdomen: good BS, non tender  Extremities:  No  C/c/e  Neuro:  Non focal    ECG: 04/25/2014: Atrial fibrillation with a heart rate of 60. Nonspecific ST and T wave changes. Assessment / Plan:

## 2014-04-25 NOTE — Patient Instructions (Signed)
Your physician recommends that you continue on your current medications as directed. Please refer to the Current Medication list given to you today.  Your physician recommends that you have lab work:  TODAY - BMET  Your physician wants you to follow-up in: 6 months with Dr. Acie Fredrickson.  You will receive a reminder letter in the mail two months in advance. If you don't receive a letter, please call our office to schedule the follow-up appointment.

## 2014-04-25 NOTE — Assessment & Plan Note (Signed)
BP is well controlled. We may need to start a low dose HCTZ for his hperkalemia  He may need to see a nephrologist if his potassium remains elevated

## 2014-04-25 NOTE — Assessment & Plan Note (Signed)
Lipids are ok   He has had some hyperkalemia Will recheck today

## 2014-04-25 NOTE — Assessment & Plan Note (Signed)
He remains in atial fib. Rate is well controlled. Continue Xarelto

## 2014-06-06 DIAGNOSIS — K5730 Diverticulosis of large intestine without perforation or abscess without bleeding: Secondary | ICD-10-CM | POA: Diagnosis not present

## 2014-06-06 DIAGNOSIS — N189 Chronic kidney disease, unspecified: Secondary | ICD-10-CM | POA: Diagnosis not present

## 2014-06-06 DIAGNOSIS — Z79899 Other long term (current) drug therapy: Secondary | ICD-10-CM | POA: Diagnosis not present

## 2014-06-06 DIAGNOSIS — Z8673 Personal history of transient ischemic attack (TIA), and cerebral infarction without residual deficits: Secondary | ICD-10-CM | POA: Diagnosis not present

## 2014-06-06 DIAGNOSIS — Z7901 Long term (current) use of anticoagulants: Secondary | ICD-10-CM | POA: Diagnosis not present

## 2014-06-06 DIAGNOSIS — Z1211 Encounter for screening for malignant neoplasm of colon: Secondary | ICD-10-CM | POA: Diagnosis not present

## 2014-06-06 DIAGNOSIS — Z8719 Personal history of other diseases of the digestive system: Secondary | ICD-10-CM | POA: Diagnosis not present

## 2014-06-06 DIAGNOSIS — E119 Type 2 diabetes mellitus without complications: Secondary | ICD-10-CM | POA: Diagnosis not present

## 2014-06-06 DIAGNOSIS — Z86718 Personal history of other venous thrombosis and embolism: Secondary | ICD-10-CM | POA: Diagnosis not present

## 2014-06-06 DIAGNOSIS — D124 Benign neoplasm of descending colon: Secondary | ICD-10-CM | POA: Diagnosis not present

## 2014-06-06 DIAGNOSIS — D128 Benign neoplasm of rectum: Secondary | ICD-10-CM | POA: Diagnosis not present

## 2014-06-07 ENCOUNTER — Encounter: Payer: Self-pay | Admitting: Neurology

## 2014-06-07 ENCOUNTER — Ambulatory Visit (INDEPENDENT_AMBULATORY_CARE_PROVIDER_SITE_OTHER): Payer: Medicare Other | Admitting: Neurology

## 2014-06-07 VITALS — BP 133/76 | HR 75 | Ht 66.0 in | Wt 171.2 lb

## 2014-06-07 DIAGNOSIS — G3184 Mild cognitive impairment, so stated: Secondary | ICD-10-CM

## 2014-06-07 MED ORDER — CEREFOLIN NAC 6-2-600 MG PO TABS: 1.0000 | ORAL_TABLET | Freq: Every morning | ORAL | Status: DC

## 2014-06-07 NOTE — Patient Instructions (Signed)
I had a long discussion with the patient and his wife regarding his memory loss as well as stroke risk, risk factor modification, personally reviewed MRI/MRA films, lab results and answered questions. Continue Xarelto for stroke prevention with strict control of hypertension with blood pressure goal below 130/90, lipids with LDL cholesterol goal below 100 mg percent and diabetes with him about an A1c goal below 6.5%. I again encouraged him to do mentally challenging exercises as well as start taking Cerefolin NAC 1 tablet daily as well as reservetrol 200 mg daily for mild cognitive impairment. Return for follow-up in 6 months or call earlier if necessary.

## 2014-06-07 NOTE — Progress Notes (Signed)
[facility] [address] [place]. [place] [address] [phone]       OFFICE CONSULT NOTE  [name] Date of Birth:  12/30/1935 Medical Record Number:  [id number]   Referring MD:  Reinaldo Meeker  Reason for Referral:  Vision and memory loss  HPI: 79 year male who had a transient episode of vision loss in the right eye on 01/28/14. He noticed cloudiness which she describes as a horizontal line in his right eye and he could not see below the line the bottom half but he was able to see above it. This lasted only a few minutes and recovered. He denied any accompanying of following headache for seeing zigzag patterns or lines. He is dizzy had a similar episode in November 2014 when he had bilateral vision loss involving the lower half of the field which also lasted only a minute or so. He did not seek any medical help her evaluation in that time period. She denies any chronic headaches, muscle aches, pain, scalp tenderness or jaw claudication. He was seen by me in October 2013 when he had a right parietal insular cortex infarct and was diagnosed with new-onset atrial fibrillation. He is done well from that stroke and recovered completely. He has been on Xarelto of and has been tolerated well without bleeding or bruising. He is in a complain of memory loss particularly not remembering dates and names as well as his hands. He gets frustrated from this and got treated recently. He however remains independent of activities of daily living. His wife handles the finances but the patient can drive and manage his own affairs. He states his blood pressure control has been quite good and his fasting sugars usually run below 120. Update 06/07/2014 : He returns for follow-up after last visit 6 months ago. He continues to do well and has not had a recurrent stroke or TIA symptoms. He saw ophthalmologist Dr. Samara Snide who felt he must have had cholesterol emboli. The patient did undergo MRI  scan of the brain on 02/14/14 which showed no acute abnormality with changes of chronic microvascular ischemia and generalized cerebral atrophy. MRA of the brain and neck did not reveal significant extra or intracranial vascular stenosis. Lab work  On 02/02/14 showed normal vitamin B12, TSH and RPR. Today on the Mini-Mental Status exam score 28/30. He had a colonoscopy yesterday and had held his xarelto for 5 days. He had 3 polyps removed and plans on restarting Xarelto he has not been doing mentally challenging exercises. ROS:   14 system review of systems is positive for hearing loss, shortness of breath, and daytime sleepiness, frequency of urination and all other systems negative  PMH:  Past Medical History  Diagnosis Date  . Diabetes mellitus Age 80  . Hyperlipidemia   . AAA (abdominal aortic aneurysm)   . Hypertension   . Coronary artery disease     post CABG x5 --Severe three-vessel coronary disease  . Arthritis   . Stroke Oct. 1, 2013    Mini stroke- Afib  . Irregular heart beat  Feb 18, 2012  . Renal artery stenosis   . Atrial fibrillation     Social History:  History   Social History  . Marital Status: Married    Spouse Name: N/A    Number of Children: N/A  . Years of Education: N/A   Occupational History  . Not on file.   Social History Main Topics  . Smoking status: Former Smoker --  2.00 packs/day for 44 years    Types: Cigarettes    Quit date: 05/20/1988  . Smokeless tobacco: Former Systems developer    Quit date: 02/11/2003  . Alcohol Use: No  . Drug Use: No  . Sexual Activity: No   Other Topics Concern  . Not on file   Social History Narrative    Medications:   Current Outpatient Prescriptions on File Prior to Visit  Medication Sig Dispense Refill  . atorvastatin (LIPITOR) 80 MG tablet TAKE 1 TABLET DAILY 90 tablet 1  . diltiazem (CARDIZEM CD) 120 MG 24 hr capsule TAKE 1 CAPSULE DAILY 90 capsule 3  . metFORMIN (GLUCOPHAGE) 500 MG tablet Take 500 mg by mouth 2  (two) times daily with a meal.      . metoprolol succinate (TOPROL-XL) 50 MG 24 hr tablet Take 1 tablet (50 mg total) by mouth daily. Take with or immediately following a meal. 90 tablet 3  . rivaroxaban (XARELTO) 20 MG TABS tablet TAKE 1 TABLET DAILY WITH SUPPER 90 tablet 3  . tamsulosin (FLOMAX) 0.4 MG CAPS capsule 0.4 mg daily.      No current facility-administered medications on file prior to visit.    Allergies:  No Known Allergies  Physical Exam General: well developed, well nourished elderly male, seated, in no evident distress Head: head normocephalic and atraumatic. Orohparynx benign Neck: supple with no carotid or supraclavicular bruits Cardiovascular: regular rate and rhythm, no murmurs Musculoskeletal: no deformity Skin:  no rash/petichiae Vascular:  Normal pulses all extremities Filed Vitals:   06/07/14 1435  BP: 133/76  Pulse: 75    Neurologic Exam Mental Status: Awake and fully alert. Oriented to place and time. Recent and remote memory diminished. Attention span, concentration and fund of knowledge appropriate. Mood and affect appropriate.  Cranial Nerves: Fundoscopic exam not done . Pupils equal, briskly reactive to light. Extraocular movements full without nystagmus. Visual fields full to confrontation. Hearing diminished bilaterally. Facial sensation intact. Face, tongue, palate moves normally and symmetrically.  Motor: Normal bulk and tone. Normal strength in all tested extremity muscles. Sensory.: intact to touch and pinprick and vibratory sensation.  Coordination: Rapid alternating movements normal in all extremities. Finger-to-nose and heel-to-shin performed accurately bilaterally. Gait and Station: Arises from chair without difficulty. Stance is normal. Gait demonstrates normal stride length and balance . Unable to heel, toe and tandem walk without difficulty.  Reflexes: 1+ and symmetric. Toes downgoing.     ASSESSMENT: 79 year male with transient episodes  of visual loss of unclear etiology possibly small vessel retinal TIAs. She complains of memory and cognitive difficulties likely from mild cognitive impairment but strong family history of Alzheimer's and is concerning. Remote history of embolic right MCA branch infarct in October 2013 from atrial fibrillation with good neurological recovery    PLAN: I had a long discussion with the patient and his wife regarding his post stroke spasticity and discuss treatment options including Botox injections which did not appear to help the patient. Plan to increase baclofen to 10 mg half a tablet in the morning and 1 tablet at night and if tolerated may increase further. The patient is having shortness of breath, weight gain and leg swelling I recommend he see his primary care physician emergently to have this evaluated. Continue Keppra for seizures and Xarelto for stroke prevention with strict control of lipids with LDL cholesterol goal below 100 mg percent. Return for follow-up in 6 months or call earlier if necessary  Note: This document was prepared  with digital dictation and possible smart phrase technology. Any transcriptional errors that result from this process are unintentional.    

## 2014-07-11 DIAGNOSIS — E119 Type 2 diabetes mellitus without complications: Secondary | ICD-10-CM | POA: Diagnosis not present

## 2014-07-11 DIAGNOSIS — I10 Essential (primary) hypertension: Secondary | ICD-10-CM | POA: Diagnosis not present

## 2014-07-11 DIAGNOSIS — E785 Hyperlipidemia, unspecified: Secondary | ICD-10-CM | POA: Diagnosis not present

## 2014-07-11 DIAGNOSIS — I4891 Unspecified atrial fibrillation: Secondary | ICD-10-CM | POA: Diagnosis not present

## 2014-07-11 DIAGNOSIS — I6340 Cerebral infarction due to embolism of unspecified cerebral artery: Secondary | ICD-10-CM | POA: Diagnosis not present

## 2014-08-01 ENCOUNTER — Encounter: Payer: Self-pay | Admitting: Family

## 2014-08-02 ENCOUNTER — Ambulatory Visit (INDEPENDENT_AMBULATORY_CARE_PROVIDER_SITE_OTHER): Payer: Medicare Other | Admitting: Family

## 2014-08-02 ENCOUNTER — Ambulatory Visit (INDEPENDENT_AMBULATORY_CARE_PROVIDER_SITE_OTHER)
Admission: RE | Admit: 2014-08-02 | Discharge: 2014-08-02 | Disposition: A | Discharge status: Home / Self Care | Payer: Medicare Other | Source: Ambulatory Visit | Attending: Family | Admitting: Family

## 2014-08-02 ENCOUNTER — Encounter: Payer: Self-pay | Admitting: Family

## 2014-08-02 ENCOUNTER — Other Ambulatory Visit: Payer: Self-pay

## 2014-08-02 ENCOUNTER — Ambulatory Visit (HOSPITAL_COMMUNITY)
Admission: RE | Admit: 2014-08-02 | Discharge: 2014-08-02 | Disposition: A | Discharge status: Home / Self Care | Payer: Medicare Other | Source: Ambulatory Visit | Attending: Family | Admitting: Family

## 2014-08-02 VITALS — BP 142/76 | HR 71 | Resp 16 | Ht 66.0 in | Wt 171.0 lb

## 2014-08-02 DIAGNOSIS — I4891 Unspecified atrial fibrillation: Secondary | ICD-10-CM

## 2014-08-02 DIAGNOSIS — I714 Abdominal aortic aneurysm, without rupture, unspecified: Secondary | ICD-10-CM

## 2014-08-02 DIAGNOSIS — I6529 Occlusion and stenosis of unspecified carotid artery: Secondary | ICD-10-CM

## 2014-08-02 DIAGNOSIS — Z95828 Presence of other vascular implants and grafts: Secondary | ICD-10-CM | POA: Diagnosis not present

## 2014-08-02 DIAGNOSIS — I701 Atherosclerosis of renal artery: Secondary | ICD-10-CM | POA: Diagnosis not present

## 2014-08-02 DIAGNOSIS — Z8673 Personal history of transient ischemic attack (TIA), and cerebral infarction without residual deficits: Secondary | ICD-10-CM | POA: Diagnosis not present

## 2014-08-02 DIAGNOSIS — Z48812 Encounter for surgical aftercare following surgery on the circulatory system: Secondary | ICD-10-CM

## 2014-08-02 DIAGNOSIS — I6523 Occlusion and stenosis of bilateral carotid arteries: Secondary | ICD-10-CM

## 2014-08-02 NOTE — Progress Notes (Signed)
[facility]  Established EVAR/renal artery stenosis  History of Present Illness  Gabriel Gutierrez is a 79 y.o. ([birth year]) male  patient of Dr. Kellie Simmering who presents with chief complaint: follow up on renal artery stenosis and s/p aortobiiliac [name] Zenith stent graft placed by Dr. Kellie Simmering in 2007 for abdominal aortic aneurysm. He also returns today for carotid artery Duplex. He had a stroke October, 2013, has a-fib. for which he is taking Jennye Moccasin, now prescribed by Dr. Acie Fredrickson, his cardiologist.  His left side weakness has resolved but still has left side facial weakness with smiling but no noticeable facial droop otherwise.   Previous (02/05/2011) CTA abdomen/pelvis:  Developing moderately severe ostial stenosis of the left renal  artery. Additionally, there is been an interval 0.5 cm decrease in  size of the left kidney since the prior study suggesting that this  stenosis may be hemodynamically significant.   He quit smoking in 1990.  He does have DM, well controlled.   He denies back or abdominal pain.  The patient's blood pressure has been stable. The patient's blood pressure medication regimen has remained stable. The patient's urinary history has remained stable, he does seem to have BPH, Flomax helps.   He states his left knee given him problems with walking, but plays 18 holes weekly, does not have claudication symptoms with walking, denies non healing wounds.  He has recurrent URI's, is seeing his PCP today.  Pt smoker: former smoker, quit in 1990  Pt meds include: Statin :Yes Betablocker: Yes ASA: No Other anticoagulants/antiplatelets: Xaralto for atrial fib   Past Medical History  Diagnosis Date  . Diabetes mellitus Age 33  . Hyperlipidemia   . AAA (abdominal aortic aneurysm)   . Hypertension   . Coronary artery disease     post CABG x5 --Severe three-vessel coronary disease  . Arthritis   . Stroke Oct. 1, 2013    Mini  stroke- Afib  . Irregular heart beat  Feb 18, 2012  . Renal artery stenosis   . Atrial fibrillation    Past Surgical History  Procedure Laterality Date  . Pr vein bypass graft,aorto-fem-pop  12/23/2005    Infrarenal abdominal aortic aneurysm, right common iliac occlusive disease --   . Colon surgery      ruptured colon  . Median sternotomy    . Hand surgery  1997  . Cataract extraction      left eye  . Skin graft  1992  . Cardiac catheterization  08/26/2005    Est. EF of 65-70% -- Severe three-vessel coronary artery disease -- very heavily calcified vessels and really there is no role for angioplasty  -- he heavily calcified vessels and really there is no role for angioplasty abdominal aortic aneurysm surgery  . Coronary artery bypass graft  09/02/2005    x 5 -- left internal mammary artery graft to the left anterior descending coronary artery, with a saphenous vein graft to the diagonal branch of the LAD, a sequential saphenous vein graft to the second and fourth obtuse marginal branches of the left circumflex coronary artery, and a saphenous vein graft to the posterior descending branch of RCA  -- Endoscopic vein harvesting from the left leg  . Laparotomy  1998  . Cardioversion  04/15/2012    Procedure: CARDIOVERSION;  Surgeon: Thayer Headings, MD;  Location: [facility];  Service: Cardiovascular;  Laterality: N/A;  . Eye surgery Left     Cataract  . Eye surgery  Right     Cataract   Social History History  Substance Use Topics  . Smoking status: Former Smoker -- 2.00 packs/day for 44 years    Types: Cigarettes    Quit date: 05/20/1988  . Smokeless tobacco: Former Systems developer    Quit date: 02/11/2003  . Alcohol Use: No   Family History Family History  Problem Relation Age of Onset  . Coronary artery disease Mother   . Heart disease Mother     After age 31-Carotid  . Hyperlipidemia Mother   . Stroke Mother   . Coronary artery disease Father   . Hyperlipidemia Father   .  Hypertension Father   . Varicose Veins Father   . Heart attack Father   . Coronary artery disease Sister   . Heart disease Sister     After age 46- BP  . Heart attack Sister   . Diabetes Brother   . Heart disease Brother     After 81 yrs of age  . Heart attack Brother   . Hyperlipidemia Brother   . Alzheimer's disease Sister   . Seizures Sister    Current Outpatient Prescriptions on File Prior to Visit  Medication Sig Dispense Refill  . atorvastatin (LIPITOR) 80 MG tablet TAKE 1 TABLET DAILY 90 tablet 1  . diltiazem (CARDIZEM CD) 120 MG 24 hr capsule TAKE 1 CAPSULE DAILY 90 capsule 3  . metFORMIN (GLUCOPHAGE) 500 MG tablet Take 500 mg by mouth 2 (two) times daily with a meal.      . Methylfol-Methylcob-Acetylcyst (CEREFOLIN NAC) 6-2-600 MG TABS Take 1 capsule by mouth every morning. 90 each 1  . metoprolol succinate (TOPROL-XL) 50 MG 24 hr tablet Take 1 tablet (50 mg total) by mouth daily. Take with or immediately following a meal. 90 tablet 3  . rivaroxaban (XARELTO) 20 MG TABS tablet TAKE 1 TABLET DAILY WITH SUPPER 90 tablet 3  . tamsulosin (FLOMAX) 0.4 MG CAPS capsule 0.4 mg daily.      No current facility-administered medications on file prior to visit.   No Known Allergies   ROS: See HPI for pertinent positives and negatives.  Physical Examination  Filed Vitals:   08/02/14 1146 08/02/14 1149  BP: 146/85 142/76  Pulse: 76 71  Resp:  16  Height:  5\' 6"  (1.676 m)  Weight:  171 lb (77.565 kg)  SpO2:  98%   Body mass index is 27.61 kg/(m^2).  General: A&O x 3, WD.  Pulmonary: Sym exp, good air movt, CTAB, no rales, rhonchi, & wheezing.  Cardiac: Irregular rhythm and rate, no peripheral edema.  Vascular:  Vessel  Right  Left   Radial  Palpable  Palpable   Carotid  audible, without bruit  audible, without bruit   Aorta  Not palpable  N/A   Femoral  Palpable  Palpable   Popliteal  Not palpable  Not palpable   PT  Palpable  Palpable   DP   Palpable  Palpable    Gastrointestinal: soft, NTND, -G/R, - HSM, + mid abdominal incisional hernia that is reducible, - CVAT B.  Musculoskeletal: M/S 5/5 throughout, Extremities without ischemic changes.  Neurologic: Pain and light touch intact in extremities, Motor exam as listed above. 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        Non-Invasive Vascular Imaging  Renal  artery Duplex (Date: 08/02/2014) RENAL ARTERY DUPLEX EVALUATION    INDICATION: Renal Artery stenosis    PREVIOUS INTERVENTION(S): Endovascular Aortic Repair 2007.    DUPLEX EXAM:     AORTA Peak Systolic Velocity (cm/s): 39    RIGHT  LEFT   Peak Systolic Velocities (cm/s) Comments  Peak Systolic Velocities (cm/s) Comments  134  Renal Artery Origin/Proximal 251   113  Renal Artery Mid 90   42  Renal Artery Distal 76     Accessory Renal Artery (when present)    3.4  Renal / Aortic Ratio (RAR) 6.4  12.1 Kidney Size (cm) 11.1  Spontaneous and Phasic  Renal Vein Spontaneous and Phasic    ADDITIONAL FINDINGS:     IMPRESSION: Right renal artery is patent with stenosis of less than 60%, no hemodynamically significant stenosis is present. Left renal artery stenosis present of greater than 60%. Bilateral renal aortic ratios are overestimated due to low velocity flow involving the aorta, no hemodynamically significant stenosis is identified.    Compared to the previous exam:  Essentially unchanged since previous study on 01/19/2014.    CEREBROVASCULAR DUPLEX EVALUATION (08/02/2014)    INDICATION: Carotid stenosis     PREVIOUS INTERVENTION(S): NA    DUPLEX EXAM:     RIGHT  LEFT  Peak Systolic Velocities (cm/s) End Diastolic Velocities (cm/s) Plaque LOCATION Peak Systolic Velocities (cm/s) End Diastolic Velocities (cm/s) Plaque  127  23 HT CCA PROXIMAL 106 17 HT  105 20 HT CCA MID 106 28 HT  65 12 HT CCA DISTAL 119 29 HT  92 9 HT ECA 98 13 HT  139 30 CP ICA PROXIMAL 96 36 CP  87 26  ICA MID 114 33   114 33  ICA DISTAL 135 40     1.32 ICA / CCA Ratio (PSV) 0.91  Antegrade Vertebral Flow Antegrade  081 Brachial Systolic Pressure (mmHg) 448  Triphasic Brachial Artery Waveforms Triphasic    Plaque Morphology:  HM = Homogeneous, HT = Heterogeneous, CP = Calcific Plaque, SP = Smooth Plaque, IP = Irregular Plaque  ADDITIONAL FINDINGS:     IMPRESSION: Bilateral internal carotid artery stenosis present in the less than 40% range, which may be underestimated due to calcific plaque present making Doppler interrogation difficult.    Compared to the previous exam:  Classification downgraded on the left and stable on the right since study on 02/22/2013.      Medical Decision Making  KNOWLEDGE ESCANDON Gutierrez is a 79 y.o. male who is followed for renal artery stenosis and is s/p aortobiiliac Cook Zenith stent graft placed by Dr. Kellie Simmering in 2007 for abdominal aortic aneurysm. He also has a history of minimal carotid artery stenosis and atrial fib. His blood pressure remains in good control, he has no abdominal or back pain.  Renal artery Duplex today demonstrates the right renal artery is patent with stenosis of less than 60%, no hemodynamically significant stenosis is present. Left renal artery stenosis present of greater than 60%. Bilateral renal aortic ratios are overestimated due to low velocity flow involving the aorta, no hemodynamically significant stenosis is identified. Essentially unchanged since previous study on 01/19/2014.  Today's carotid Duplex reveals bilateral internal carotid artery stenosis present in the less than 40% range, which may be underestimated due to calcific plaque present making Doppler interrogation difficult.  Classification downgraded on the left and stable on the right since study on  02/22/2013.  Face to face time with patient was 25 minutes. Over 50%  of this time was spent on counseling and coordination of care.      I discussed with the patient the importance of surveillance of the endograft.  The next endograft duplex will be scheduled for 12 months along with bilateral renal artery Duplex; carotid artery Duplex in a year.  The patient will follow up with [place] in 12 months with these studies.  I emphasized the importance of maximal medical management including strict control of blood pressure, blood glucose, and lipid levels, antiplatelet agents, obtaining regular exercise, and cessation of smoking.   The patient was given information about AAA including signs, symptoms, treatment, and how to minimize the risk of enlargement and rupture of aneurysms.    Thank you for allowing [place] to participate in this patient's care.  Clemon Chambers, RN, MSN, FNP-C [facility] Office: [phone]  Clinic Physician: Kellie Simmering  08/02/2014, 12:13 PM

## 2014-08-02 NOTE — Patient Instructions (Signed)
Renal Artery Stenosis Renal artery stenosis (RAS) is the narrowing of the artery that supplies blood to the kidney. If the narrowing is critical and the kidney does not get enough blood, hypertension (high blood pressure) can develop. This is called renal vascular hypertension (RVH). This is a common, uncommon cause of secondary hypertension. It does not usually happen until there is at least a 70% narrowing of the artery. Decreased blood flow through the renal artery causes the kidney to release increased amounts of a hormone. It is called renin. Renin is a strong blood pressure regulator. When it is high, it causes changes that lead to hypertension. Eventually the kidney not receiving enough blood may shrink in size and become less useful. The high blood pressure that is produced can eventually damage and destroy the remaining kidney. This is called hypertensive nephrosclerosis. If both kidneys fail, it will lead to chronic renal failure.  CAUSES  Most renal artery stenosis is caused by a hardening of the arteries (atherosclerosis). This is called Atherosclerotic Renal Artery Stenosis (AS-RAS). It is caused by a build-up of cholesterol (plaques) on the inner lining of the renal artery. A much less common cause is Fibromuscular Dysplasia (FMD). With it, there is an abnormality in the muscular lining of the renal artery. FMD-RAS occurs almost exclusively in women aged 71 to 29. It rarely affects African Americans or Asians.  SYMPTOMS  Often high blood pressure is discovered on a routine blood pressure check. It may be the only sign that something is wrong. Other problems that may occur are:  You may develop calf pain when walking. This is called intermittent claudication. It may be a sign of bad circulation in the legs.  Inability to use certain blood pressure pills such as angiotensin-I (ACE-I) inhibitors or angiotensin receptor blockers (ARB's). These could cause sudden drops in blood pressure with  worsening of kidney function.  More than three antihypertensive medications may be needed for blood pressure control.  New onset of high blood pressure if you are over 55. DIAGNOSIS  Your caregiver may find suggestions of this on exam if he finds bruits (like murmurs) on listening to your abdomen (belly) or the large arteries in your neck. Your caregiver may also suspect this there is a sudden worsening of your blood pressure when it has been well controlled and you are over age 22. Additional testing that may be done includes:  One diagnostic method used for renal artery stenosis (RAS) is to measure and compare the level of renin, (blood pressure-regulating hormone released by the kidneys), in the right to the left renal veins. If the amount of renin released by one-side is markedly higher than the other, this identifies a high renin-releasing kidney consistent with RAS.  FMD-RAS is often found on renal scan with ACE-inhibitor challenge, or ultrasound with Doppler.  FMD responds well to angioplasty and stenting. The results of stenting in FMD are usually long lasting. RISK FACTORS  Most renal artery stenosis is caused by a hardening of the arteries. This is called atherosclerosis. Other risk factors associated with the development of atherosclerotic RAS include the following:   Carotid artery disease.  Obesity.  High blood pressure.  Heredity.  Old age.  Fibromuscular dysplasia.  Diabetes mellitus.  Smoking.  Hardening of the arteries. TREATMENT   Renal vascular hypertension can be very severe. It can also be difficult to control.  Medication is used to control high blood pressure (hypertension). Blood pressure medications that directly affect the renin angiotensin pathway  can be used toe help control blood pressure. ACE inhibitors and angiotensin receptor blockers (ARB's) are often effective in patients with unilateral RAS. In some cases, patients with RAS are resistant to  these medications.  In patients with bilateral RAS, these medications must be used carefully. They may cause acute renal failure (ARF). If acute renal failure develops (if creatinine increases by more than 30%), the medication is discontinued. The patient is evaluated for bilateral RAS.  Angioplasty and stenting may be used to improve blood flow. The goal is to improve the circulation of blood flow to the kidney and prevent the release of excess renin, which can help to decrease blood pressure. This helps to prevent atrophy of the kidney. In general, patients with AS-RAS should have stenting done. This is because plasty by itself has a high incidence of re-stenosis.  Surgery to bypass the narrowing may be done. If the kidney with RAS has diminished in size or strength (atrophied ), surgical removal of the kidney may be advised. This is called nephrectomy. Document Released: 01/30/2005 Document Revised: 07/29/2011 Document Reviewed: 08/19/2013 [facility] Patient Information 2015 [place], [place]. This information is not intended to replace advice given to you by your health care provider. Make sure you discuss any questions you have with your health care provider.    Stroke Prevention Some medical conditions and behaviors are associated with an increased chance of having a stroke. You may prevent a stroke by making healthy choices and managing medical conditions. HOW CAN I REDUCE MY RISK OF HAVING A STROKE?   Stay physically active. Get at least 30 minutes of activity on most or all days.  Do not smoke. It may also be helpful to avoid exposure to secondhand smoke.  Limit alcohol use. Moderate alcohol use is considered to be:  No more than 2 drinks per day for men.  No more than 1 drink per day for nonpregnant women.  Eat healthy foods. This involves:  Eating 5 or more servings of fruits and vegetables a day.  Making dietary changes that address high blood pressure (hypertension), high  cholesterol, diabetes, or obesity.  Manage your cholesterol levels.  Making food choices that are high in fiber and low in saturated fat, trans fat, and cholesterol may control cholesterol levels.  Take any prescribed medicines to control cholesterol as directed by your health care provider.  Manage your diabetes.  Controlling your carbohydrate and sugar intake is recommended to manage diabetes.  Take any prescribed medicines to control diabetes as directed by your health care provider.  Control your hypertension.  Making food choices that are low in salt (sodium), saturated fat, trans fat, and cholesterol is recommended to manage hypertension.  Take any prescribed medicines to control hypertension as directed by your health care provider.  Maintain a healthy weight.  Reducing calorie intake and making food choices that are low in sodium, saturated fat, trans fat, and cholesterol are recommended to manage weight.  Stop drug abuse.  Avoid taking birth control pills.  Talk to your health care provider about the risks of taking birth control pills if you are over 76 years old, smoke, get migraines, or have ever had a blood clot.  Get evaluated for sleep disorders (sleep apnea).  Talk to your health care provider about getting a sleep evaluation if you snore a lot or have excessive sleepiness.  Take medicines only as directed by your health care provider.  For some people, aspirin or blood thinners (anticoagulants) are helpful in reducing  the risk of forming abnormal blood clots that can lead to stroke. If you have the irregular heart rhythm of atrial fibrillation, you should be on a blood thinner unless there is a good reason you cannot take them.  Understand all your medicine instructions.  Make sure that other conditions (such as anemia or atherosclerosis) are addressed. SEEK IMMEDIATE MEDICAL CARE IF:   You have sudden weakness or numbness of the face, arm, or leg,  especially on one side of the body.  Your face or eyelid droops to one side.  You have sudden confusion.  You have trouble speaking (aphasia) or understanding.  You have sudden trouble seeing in one or both eyes.  You have sudden trouble walking.  You have dizziness.  You have a loss of balance or coordination.  You have a sudden, severe headache with no known cause.  You have new chest pain or an irregular heartbeat. Any of these symptoms may represent a serious problem that is an emergency. Do not wait to see if the symptoms will go away. Get medical help at once. Call your local emergency services (911 in [place] Do not drive yourself to the hospital. Document Released: 06/13/2004 Document Revised: 09/20/2013 Document Reviewed: 11/06/2012 [facility] Patient Information 2015 [place], [place]. This information is not intended to replace advice given to you by your health care provider. Make sure you discuss any questions you have with your health care provider.

## 2014-08-08 DIAGNOSIS — J441 Chronic obstructive pulmonary disease with (acute) exacerbation: Secondary | ICD-10-CM | POA: Diagnosis not present

## 2014-10-17 ENCOUNTER — Other Ambulatory Visit: Payer: Self-pay | Admitting: Cardiovascular Disease

## 2014-11-08 ENCOUNTER — Ambulatory Visit (INDEPENDENT_AMBULATORY_CARE_PROVIDER_SITE_OTHER): Payer: Medicare Other | Admitting: Cardiovascular Disease

## 2014-11-08 ENCOUNTER — Encounter: Payer: Self-pay | Admitting: Cardiovascular Disease

## 2014-11-08 VITALS — BP 110/56 | HR 78 | Ht 66.0 in | Wt 176.2 lb

## 2014-11-08 DIAGNOSIS — I10 Essential (primary) hypertension: Secondary | ICD-10-CM

## 2014-11-08 DIAGNOSIS — I482 Chronic atrial fibrillation, unspecified: Secondary | ICD-10-CM

## 2014-11-08 DIAGNOSIS — I2510 Atherosclerotic heart disease of native coronary artery without angina pectoris: Secondary | ICD-10-CM | POA: Diagnosis not present

## 2014-11-08 DIAGNOSIS — I701 Atherosclerosis of renal artery: Secondary | ICD-10-CM | POA: Diagnosis not present

## 2014-11-08 NOTE — Progress Notes (Signed)
Gabriel Gutierrez Date of Birth  [birth year] [facility] [address]    [place], [place]  [address] [phone]  Fax  [phone]  Problem list: 1. Coronary artery disease-status post CABG 2. Abdominal aortic aneurysm-Status post stent grafting 3. Diabetes mellitus 4. Dyslipidemia 5. Hypertension 6. Atrial fibrillation -  We have tried cardioversion in the past but it lasted only 1 week.  7. History of stroke  History of Present Illness:  Gabriel Gutierrez is a 79 yo gentleman with a history of coronary artery disease-status post coronary artery bypass grafting in 2007. He has a history of hypertension.   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    He was recently admitted to the hospital with an acute stroke. He was also found to have rapid atrial fibrillation. He stroke symptoms resolve fairly quickly and he does not have much of the deficit. He remains in atrial fibrillation.  He's been maintained on Xarelto.  He's completely asymptomatic from a cardiac standpoint. He cannot tell that his heart rate is beating irregularly.  Dec. 19, 2013 He had a cardioversion performed last month by Dr. Irish Lack who was kind enough to perform the cardioversion after I got tied up in the office. He is completely asymptomatic. He cannot tell whether his heart is in atrial fibrillation or normal sinus rhythm. He has been able to do all of his normal activities without difficulty.  August 10, 2012:  Gabriel Gutierrez is doing well.  His potassium levels have been running high. He had blood checked at his medical doctors office in February and his potassium level was 5.8. He discontinued the benazepril / HCT. Recheck potassium was even higher. He had it checked again this week with results are pending.  He is cutting out all high potassium foods.   He had a cardioversion in   Dec.  11, 2014:  Gabriel Gutierrez is doing well.  Active , works hard every day, no CP.  No dyspnea.  Sleeping well at night.  Completely asymptomatic from an a-fib standpoint.   October 27, 2013: Gabriel Gutierrez is doing ok.    Not able to do as much as he would like.  Still very active - plays golf,  Walking some .  Has some knee problems that limits him.    Dec. 7, 2015: Gabriel Gutierrez is seen today for follow up of his CAD, AAA repair, atrial fib., and dyslipidemia.  He also has a hx of DM and a CVA.   November 08, 2014:  Doing well from a cardiac standpoint. Trouble with knees.  Has had a stroke - on xarelto .  Had an occular stroke   Current Outpatient Prescriptions on File Prior to Visit  Medication Sig Dispense Refill  . atorvastatin (LIPITOR) 80 MG tablet TAKE 1 TABLET DAILY 90 tablet 1  . diltiazem (CARDIZEM CD) 120 MG 24 hr capsule TAKE 1 CAPSULE DAILY 90 capsule 3  . metFORMIN (GLUCOPHAGE) 500 MG tablet Take 500 mg by mouth 2 (two) times daily with a meal.      . Methylfol-Methylcob-Acetylcyst (CEREFOLIN NAC) 6-2-600 MG TABS Take 1 capsule by mouth every morning. 90 each 1  . metoprolol succinate (TOPROL-XL) 50 MG 24 hr tablet Take 1 tablet (50 mg total) by mouth daily. Take with or immediately following a meal. 90 tablet 3  . tamsulosin (FLOMAX)  0.4 MG CAPS capsule 0.4 mg daily.     Alveda Reasons 20 MG TABS tablet TAKE 1 TABLET DAILY WITH SUPPER 90 tablet 1   No current facility-administered medications on file prior to visit.    No Known Allergies  Past Medical History  Diagnosis Date  . Diabetes mellitus Age 79  . Hyperlipidemia   . AAA (abdominal aortic aneurysm)   . Hypertension   . Coronary artery disease     post CABG x5 --Severe three-vessel coronary disease  . Arthritis   . Stroke Oct. 1, 2013    Mini stroke- Afib  . Irregular heart beat  Feb 18, 2012  . Renal artery stenosis   . Atrial fibrillation     Past Surgical History  Procedure Laterality Date  . Pr vein bypass graft,aorto-fem-pop   12/23/2005    Infrarenal abdominal aortic aneurysm, right common iliac occlusive disease --   . Colon surgery      ruptured colon  . Median sternotomy    . Hand surgery  1997  . Cataract extraction      left eye  . Skin graft  1992  . Cardiac catheterization  08/26/2005    Est. EF of 65-70% -- Severe three-vessel coronary artery disease -- very heavily calcified vessels and really there is no role for angioplasty  -- he heavily calcified vessels and really there is no role for angioplasty abdominal aortic aneurysm surgery  . Coronary artery bypass graft  09/02/2005    x 5 -- left internal mammary artery graft to the left anterior descending coronary artery, with a saphenous vein graft to the diagonal branch of the LAD, a sequential saphenous vein graft to the second and fourth obtuse marginal branches of the left circumflex coronary artery, and a saphenous vein graft to the posterior descending branch of RCA  -- Endoscopic vein harvesting from the left leg  . Laparotomy  1998  . Cardioversion  04/15/2012    Procedure: CARDIOVERSION;  Surgeon: Thayer Headings, MD;  Location: [facility];  Service: Cardiovascular;  Laterality: N/A;  . Eye surgery Left     Cataract  . Eye surgery Right     Cataract    History  Smoking status  . Former Smoker -- 2.00 packs/day for 44 years  . Types: Cigarettes  . Quit date: 05/20/1988  Smokeless tobacco  . Former Systems developer  . Quit date: 02/11/2003    History  Alcohol Use No    Family History  Problem Relation Age of Onset  . Coronary artery disease Mother   . Heart disease Mother     After age 87-Carotid  . Hyperlipidemia Mother   . Stroke Mother   . Coronary artery disease Father   . Hyperlipidemia Father   . Hypertension Father   . Varicose Veins Father   . Heart attack Father   . Coronary artery disease Sister   . Heart disease Sister     After age 36- BP  . Heart attack Sister   . Diabetes Brother   . Heart disease Brother     After  7 yrs of age  . Heart attack Brother   . Hyperlipidemia Brother   . Alzheimer's disease Sister   . Seizures Sister     Reviw of Systems:  Reviewed in the HPI.  All other systems are negative.  Physical Exam: BP 110/56 mmHg  Pulse 78  Ht 5\' 6"  (1.676 m)  Wt 79.924 kg (176 lb 3.2 oz)  BMI  28.45 kg/m2  SpO2 99% The patient is alert and oriented x 3.  The mood and affect are normal.   Skin: warm and dry.  Color is normal.    HEENT:   [place]/AT, no JVD, normal carotids  Lungs: clear   Heart: irregularly irregular, no murmurs    Abdomen: good BS, non tender  Extremities:  No  C/c/e  Neuro:  Non focal    ECG: 04/25/2014: Atrial fibrillation with a heart rate of 60. Nonspecific ST and T wave changes. Assessment / Plan:   1. Coronary artery disease-status post CABG - Gabriel Gutierrez is doing well. Is not having any angina. Continue current medications. 2. Abdominal aortic aneurysm-Status post stent grafting 3. Diabetes mellitus 4. Dyslipidemia - his lipid levels were checked by his general medical doctor. His cholesterol is normal. Continue current medications. I'll see her again in 6 months. 5. Hypertension 6. Atrial fibrillation -  We have tried cardioversion in the past but it lasted only 1 week. He's had a stroke. He's now on Xarelto. Continue current medications. 7. History of stroke   Elonda Giuliano, Wonda Cheng, MD  11/08/2014 2:48 PM    [address] [place],  [place], [place]  [address] Pager [phone] Phone: [phone]; Fax: [phone]   [place]  [address] [place], [place]  [address] [phone]    Fax [phone]

## 2014-11-08 NOTE — Patient Instructions (Signed)
Medication Instructions:  Your physician recommends that you continue on your current medications as directed. Please refer to the Current Medication list given to you today.   Labwork: Your physician recommends that you return for lab work in: 6 months on the day of or a few days before your office visit with Dr. Acie Fredrickson.  You will need to FAST for this appointment - nothing to eat or drink after midnight the night before except water.   Testing/Procedures: None Ordered   Follow-Up: Your physician wants you to follow-up in: 6 months with Dr. Acie Fredrickson.  You will receive a reminder letter in the mail two months in advance. If you don't receive a letter, please call our office to schedule the follow-up appointment.   Any Other Special Instructions Will Be Listed Below (If Applicable).

## 2014-11-18 DIAGNOSIS — E119 Type 2 diabetes mellitus without complications: Secondary | ICD-10-CM | POA: Diagnosis not present

## 2014-11-18 DIAGNOSIS — E785 Hyperlipidemia, unspecified: Secondary | ICD-10-CM | POA: Diagnosis not present

## 2014-11-18 DIAGNOSIS — Z6829 Body mass index (BMI) 29.0-29.9, adult: Secondary | ICD-10-CM | POA: Diagnosis not present

## 2014-11-18 DIAGNOSIS — N401 Enlarged prostate with lower urinary tract symptoms: Secondary | ICD-10-CM | POA: Diagnosis not present

## 2014-11-18 DIAGNOSIS — Z1389 Encounter for screening for other disorder: Secondary | ICD-10-CM | POA: Diagnosis not present

## 2014-11-18 DIAGNOSIS — I4891 Unspecified atrial fibrillation: Secondary | ICD-10-CM | POA: Diagnosis not present

## 2014-11-18 DIAGNOSIS — I10 Essential (primary) hypertension: Secondary | ICD-10-CM | POA: Diagnosis not present

## 2014-11-18 DIAGNOSIS — N183 Chronic kidney disease, stage 3 (moderate): Secondary | ICD-10-CM | POA: Diagnosis not present

## 2014-11-25 DIAGNOSIS — E875 Hyperkalemia: Secondary | ICD-10-CM | POA: Diagnosis not present

## 2014-11-26 ENCOUNTER — Other Ambulatory Visit: Payer: Self-pay | Admitting: Cardiovascular Disease

## 2014-12-06 ENCOUNTER — Encounter: Payer: Self-pay | Admitting: Neurology

## 2014-12-06 ENCOUNTER — Ambulatory Visit (INDEPENDENT_AMBULATORY_CARE_PROVIDER_SITE_OTHER): Payer: Medicare Other | Admitting: Neurology

## 2014-12-06 VITALS — BP 117/66 | HR 62 | Ht 66.0 in | Wt 173.4 lb

## 2014-12-06 DIAGNOSIS — I701 Atherosclerosis of renal artery: Secondary | ICD-10-CM | POA: Diagnosis not present

## 2014-12-06 DIAGNOSIS — R413 Other amnesia: Secondary | ICD-10-CM | POA: Diagnosis not present

## 2014-12-06 NOTE — Patient Instructions (Signed)
I had a long d/w patient about his remote stroke, risk for recurrent stroke/TIAs, personally independently reviewed imaging studies and stroke evaluation results and answered questions.Continue xarelto for secondary stroke prevention and maintain strict control of hypertension with blood pressure goal below 130/90, diabetes with hemoglobin A1c goal below 6.5% and lipids with LDL cholesterol goal below 100 mg/dL. I also advised the patient to eat a healthy diet with plenty of whole grains, cereals, fruits and vegetables, exercise regularly and maintain ideal body weight .continue Cerefolin for mild memory loss as well as participate in activities for cognitive challenge like bridge, puzzles and sudoku Followup in the future with me in 6 months or call earlier if necessary.

## 2014-12-06 NOTE — Progress Notes (Signed)
[facility] [address] [place]. [place] [address] [phone]       OFFICE FOLLOW UP VISIT NOTE  [name] Date of Birth:  09-24-1935 Medical Record Number:  [id number]   Referring MD:  Gabriel Gutierrez  Reason for Referral:  Vision and memory loss  HPI: 78 year male who had a transient episode of vision loss in the right eye on 01/28/14. He noticed cloudiness which she describes as a horizontal line in his right eye and he could not see below the line the bottom half but he was able to see above it. This lasted only a few minutes and recovered. He denied any accompanying of following headache for seeing zigzag patterns or lines. He is dizzy had a similar episode in November 2014 when he had bilateral vision loss involving the lower half of the field which also lasted only a minute or so. He did not seek any medical help her evaluation in that time period. She denies any chronic headaches, muscle aches, pain, scalp tenderness or jaw claudication. He was seen by me in October 2013 when he had a right parietal insular cortex infarct and was diagnosed with new-onset atrial fibrillation. He is done well from that stroke and recovered completely. He has been on Xarelto of and has been tolerated well without bleeding or bruising. He is in a complain of memory loss particularly not remembering dates and names as well as his hands. He gets frustrated from this and got treated recently. He however remains independent of activities of daily living. His wife handles the finances but the patient can drive and manage his own affairs. He states his blood pressure control has been quite good and his fasting sugars usually run below 120. Update 06/07/2014 : He returns for follow-up after last visit 6 months ago. He continues to do well and has not had a recurrent stroke or TIA symptoms. He saw ophthalmologist Dr. Samara Gutierrez who felt he must have had cholesterol emboli. The patient did  undergo MRI scan of the brain on 02/14/14 which showed no acute abnormality with changes of chronic microvascular ischemia and generalized cerebral atrophy. MRA of the brain and neck did not reveal significant extra or intracranial vascular stenosis. Lab work  On 02/02/14 showed normal vitamin B12, TSH and RPR. Today on the Mini-Mental Status exam score 28/30. He had a colonoscopy yesterday and had held his xarelto for 5 days. He had 3 polyps removed and plans on restarting Xarelto he has not been doing mentally challenging exercises. Update 12/06/2014 : He returns for follow-up after last visit 6 months ago. He is accompanied by his wife feels that his memory may be somewhat better. He is taking Cerefolin but finds it quite expensive. He continues to have trouble remembering mainly recent information the memory from the past seems quite good. He is independent in activities of daily living. There have been no changes in behavior, agitation, hallucinations or nightmares or delusions noted. He continues to be bothered by left knee pain and wears a knee brace. He has not had any recurrent stroke or TIA symptoms and remains on Xarelto which is tolerating well without significant bleeding or bruising. His blood pressure seems well controlled. Lipid profile has not been checked recently. ROS:   14 system review of systems is positive for hearing loss, wheezing, neck pain, joint pain, memory loss and all other systems negative  PMH:  Past Medical History  Diagnosis Date  . Diabetes mellitus  Age 29  . Hyperlipidemia   . AAA (abdominal aortic aneurysm)   . Hypertension   . Coronary artery disease     post CABG x5 --Severe three-vessel coronary disease  . Arthritis   . Stroke Oct. 1, 2013    Mini stroke- Afib  . Irregular heart beat  Feb 18, 2012  . Renal artery stenosis   . Atrial fibrillation     Social History:  History   Social History  . Marital Status: Married    Spouse Name: N/A  . Number of  Children: N/A  . Years of Education: N/A   Occupational History  . Not on file.   Social History Main Topics  . Smoking status: Former Smoker -- 2.00 packs/day for 44 years    Types: Cigarettes    Quit date: 05/20/1988  . Smokeless tobacco: Former Systems developer    Quit date: 02/11/2003  . Alcohol Use: No  . Drug Use: No  . Sexual Activity: No   Other Topics Concern  . Not on file   Social History Narrative    Medications:   Current Outpatient Prescriptions on File Prior to Visit  Medication Sig Dispense Refill  . atorvastatin (LIPITOR) 80 MG tablet TAKE 1 TABLET DAILY 90 tablet 1  . diltiazem (CARDIZEM CD) 120 MG 24 hr capsule TAKE 1 CAPSULE DAILY 90 capsule 2  . metFORMIN (GLUCOPHAGE) 500 MG tablet Take 500 mg by mouth 2 (two) times daily with a meal.      . Methylfol-Methylcob-Acetylcyst (CEREFOLIN NAC) 6-2-600 MG TABS Take 1 capsule by mouth every morning. 90 each 1  . metoprolol succinate (TOPROL-XL) 50 MG 24 hr tablet Take 1 tablet (50 mg total) by mouth daily. Take with or immediately following a meal. 90 tablet 3  . tamsulosin (FLOMAX) 0.4 MG CAPS capsule 0.4 mg daily.     Alveda Reasons 20 MG TABS tablet TAKE 1 TABLET DAILY WITH SUPPER 90 tablet 1  . Methylfol-Algae-B12-Acetylcyst (CEREFOLIN NAC) 6-90.314-2-600 MG TABS Take by mouth daily.     No current facility-administered medications on file prior to visit.    Allergies:  No Known Allergies  Physical Exam General: well developed, well nourished elderly male, seated, in no evident distress Head: head normocephalic and atraumatic.  Neck: supple with no carotid or supraclavicular bruits Cardiovascular: regular rate and rhythm, no murmurs Musculoskeletal: no deformity Skin:  no rash/petichiae Vascular:  Normal pulses all extremities Filed Vitals:   12/06/14 1440  BP: 117/66  Pulse: 62    Neurologic Exam Mental Status: Awake and fully alert. Oriented to place and time. Recent and remote memory diminished. Attention  span, concentration and fund of knowledge appropriate. Mood and affect appropriate. Mini-Mental status exam score 29/30 which is slightly better than 28/30 at last visit. Clock drawing 4/4. Animal naming test 8. Cranial Nerves: Fundoscopic exam not done . Pupils equal, briskly reactive to light. Extraocular movements full without nystagmus. Visual fields full to confrontation. Hearing diminished bilaterally. Facial sensation intact. Face, tongue, palate moves normally and symmetrically.  Motor: Normal bulk and tone. Normal strength in all tested extremity muscles. Sensory.: intact to touch and pinprick and vibratory sensation.  Coordination: Rapid alternating movements normal in all extremities. Finger-to-nose and heel-to-shin performed accurately bilaterally. Gait and Station: Arises from chair without difficulty. Stance is normal. Gait demonstrates normal stride length and balance . Unable to heel, toe and tandem walk without difficulty.  Reflexes: 1+ and symmetric. Toes downgoing.     ASSESSMENT: 79 year male with  transient episodes of visual loss of unclear etiology possibly small vessel retinal TIAs. He complains of memory and cognitive difficulties likely from mild cognitive impairment but strong family history of Alzheimer's and is concerning. Remote history of embolic right MCA branch infarct in October 2013 from atrial fibrillation with good neurological recovery    PLAN: I I had a long d/w patient about his remote stroke, risk for recurrent stroke/TIAs, personally independently reviewed imaging studies and stroke evaluation results and answered questions.Continue xarelto for secondary stroke prevention and maintain strict control of hypertension with blood pressure goal below 130/90, diabetes with hemoglobin A1c goal below 6.5% and lipids with LDL cholesterol goal below 100 mg/dL. I also advised the patient to eat a healthy diet with plenty of whole grains, cereals, fruits and vegetables,  exercise regularly and maintain ideal body weight .continue Cerefolin for mild memory loss as well as participate in activities for cognitive challenge like bridge, puzzles and sudoku Followup in the future with me in 6 months or call earlier if necessary.  Antony Contras, MD Note: This document was prepared with digital dictation and possible smart phrase technology. Any transcriptional errors that result from this process are unintentional.

## 2015-01-12 DIAGNOSIS — Z6829 Body mass index (BMI) 29.0-29.9, adult: Secondary | ICD-10-CM | POA: Diagnosis not present

## 2015-01-12 DIAGNOSIS — Z0000 Encounter for general adult medical examination without abnormal findings: Secondary | ICD-10-CM | POA: Diagnosis not present

## 2015-01-24 DIAGNOSIS — Z1211 Encounter for screening for malignant neoplasm of colon: Secondary | ICD-10-CM | POA: Diagnosis not present

## 2015-01-31 ENCOUNTER — Ambulatory Visit: Payer: Medicare Other | Admitting: Family

## 2015-01-31 ENCOUNTER — Other Ambulatory Visit (HOSPITAL_COMMUNITY): Payer: Medicare Other

## 2015-03-28 DIAGNOSIS — Z6829 Body mass index (BMI) 29.0-29.9, adult: Secondary | ICD-10-CM | POA: Diagnosis not present

## 2015-03-28 DIAGNOSIS — I6340 Cerebral infarction due to embolism of unspecified cerebral artery: Secondary | ICD-10-CM | POA: Diagnosis not present

## 2015-03-28 DIAGNOSIS — E1142 Type 2 diabetes mellitus with diabetic polyneuropathy: Secondary | ICD-10-CM | POA: Diagnosis not present

## 2015-03-28 DIAGNOSIS — E1140 Type 2 diabetes mellitus with diabetic neuropathy, unspecified: Secondary | ICD-10-CM | POA: Diagnosis not present

## 2015-03-28 DIAGNOSIS — Z23 Encounter for immunization: Secondary | ICD-10-CM | POA: Diagnosis not present

## 2015-03-28 DIAGNOSIS — E785 Hyperlipidemia, unspecified: Secondary | ICD-10-CM | POA: Diagnosis not present

## 2015-03-28 DIAGNOSIS — I4891 Unspecified atrial fibrillation: Secondary | ICD-10-CM | POA: Diagnosis not present

## 2015-03-28 DIAGNOSIS — I10 Essential (primary) hypertension: Secondary | ICD-10-CM | POA: Diagnosis not present

## 2015-04-16 ENCOUNTER — Other Ambulatory Visit: Payer: Self-pay | Admitting: Cardiovascular Disease

## 2015-04-18 DIAGNOSIS — E119 Type 2 diabetes mellitus without complications: Secondary | ICD-10-CM | POA: Diagnosis not present

## 2015-04-18 DIAGNOSIS — Z7984 Long term (current) use of oral hypoglycemic drugs: Secondary | ICD-10-CM | POA: Diagnosis not present

## 2015-04-27 DIAGNOSIS — M1712 Unilateral primary osteoarthritis, left knee: Secondary | ICD-10-CM | POA: Diagnosis not present

## 2015-04-27 DIAGNOSIS — M25462 Effusion, left knee: Secondary | ICD-10-CM | POA: Diagnosis not present

## 2015-05-05 ENCOUNTER — Other Ambulatory Visit: Payer: Self-pay | Admitting: Orthopedic Surgery

## 2015-05-05 DIAGNOSIS — R52 Pain, unspecified: Secondary | ICD-10-CM

## 2015-05-17 ENCOUNTER — Other Ambulatory Visit: Payer: Self-pay | Admitting: Orthopedic Surgery

## 2015-05-17 DIAGNOSIS — R52 Pain, unspecified: Secondary | ICD-10-CM

## 2015-05-18 ENCOUNTER — Ambulatory Visit
Admission: RE | Admit: 2015-05-18 | Discharge: 2015-05-18 | Disposition: A | Discharge status: Home / Self Care | Payer: Medicare Other | Source: Ambulatory Visit | Attending: Orthopedic Surgery | Admitting: Orthopedic Surgery

## 2015-05-18 DIAGNOSIS — R52 Pain, unspecified: Secondary | ICD-10-CM

## 2015-05-18 DIAGNOSIS — M1712 Unilateral primary osteoarthritis, left knee: Secondary | ICD-10-CM | POA: Diagnosis not present

## 2015-05-18 DIAGNOSIS — Z01818 Encounter for other preprocedural examination: Secondary | ICD-10-CM | POA: Diagnosis not present

## 2015-05-30 ENCOUNTER — Ambulatory Visit: Payer: Medicare Other | Admitting: Cardiovascular Disease

## 2015-06-12 ENCOUNTER — Encounter: Payer: Self-pay | Admitting: Cardiovascular Disease

## 2015-06-12 ENCOUNTER — Ambulatory Visit (INDEPENDENT_AMBULATORY_CARE_PROVIDER_SITE_OTHER): Payer: Medicare Other | Admitting: Cardiovascular Disease

## 2015-06-12 VITALS — BP 160/70 | HR 81 | Ht 66.0 in | Wt 174.8 lb

## 2015-06-12 DIAGNOSIS — I482 Chronic atrial fibrillation, unspecified: Secondary | ICD-10-CM

## 2015-06-12 DIAGNOSIS — I25810 Atherosclerosis of coronary artery bypass graft(s) without angina pectoris: Secondary | ICD-10-CM

## 2015-06-12 DIAGNOSIS — I10 Essential (primary) hypertension: Secondary | ICD-10-CM

## 2015-06-12 NOTE — Patient Instructions (Signed)
Medication Instructions:  The current medical regimen is effective;  continue present plan and medications.  Follow-Up: Follow up in 6 months with Dr. Acie Fredrickson.  You will receive a letter in the mail 2 months before you are due.  Please call us when you receive this letter to schedule your follow up appointment.  You have been cleared for surgery.  Please hold your Xarelto the day before and the day of your surgery.  Resume as soon as possible afterwards.  If you need a refill on your cardiac medications before your next appointment, please call your pharmacy.  Thank you for choosing [facility]!!

## 2015-06-12 NOTE — Progress Notes (Signed)
Gabriel Gutierrez Date of Birth  09-14-1935 [facility] [address]    [place], [place]  [address] [phone]  Fax  [phone]  Problem list: 1. Coronary artery disease-status post CABG 2. Abdominal aortic aneurysm-Status post stent grafting 3. Diabetes mellitus 4. Dyslipidemia 5. Hypertension 6. Atrial fibrillation -  We have tried cardioversion in the past but it lasted only 1 week.  7. History of stroke  History of Present Illness:  Gabriel Gutierrez is a 80 yo gentleman with a history of coronary artery disease-status post coronary artery bypass grafting in 2007. He has a history of hypertension.   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    He was recently admitted to the hospital with an acute stroke. He was also found to have rapid atrial fibrillation. He stroke symptoms resolve fairly quickly and he does not have much of the deficit. He remains in atrial fibrillation.  He's been maintained on Xarelto.  He's completely asymptomatic from a cardiac standpoint. He cannot tell that his heart rate is beating irregularly.  Dec. 19, 2013 He had a cardioversion performed last month by Dr. Irish Lack who was kind enough to perform the cardioversion after I got tied up in the office. He is completely asymptomatic. He cannot tell whether his heart is in atrial fibrillation or normal sinus rhythm. He has been able to do all of his normal activities without difficulty.  August 10, 2012:  Gabriel Gutierrez is doing well.  His potassium levels have been running high. He had blood checked at his medical doctors office in February and his potassium level was 5.8. He discontinued the benazepril / HCT. Recheck potassium was even higher. He had it checked again this week with results are pending.  He is cutting out all high potassium foods.   He had a cardioversion in   Dec.  11, 2014:  Gabriel Gutierrez is doing well.  Active , works hard every day, no CP.  No dyspnea.  Sleeping well at night.  Completely asymptomatic from an a-fib standpoint.   October 27, 2013: Gabriel Gutierrez is doing ok.    Not able to do as much as he would like.  Still very active - plays golf,  Walking some .  Has some knee problems that limits him.    Dec. 7, 2015: Gabriel Gutierrez is seen today for follow up of his CAD, AAA repair, atrial fib., and dyslipidemia.  He also has a hx of DM and a CVA.   November 08, 2014:  Doing well from a cardiac standpoint. Trouble with knees.  Has had a stroke - on xarelto .  Had an occular stroke  Jan. 23. 2017 Gabriel Gutierrez is doing well from a cardiac standpoint. Needs partial replacement of the left knee Doing great.  No CP,  Has some DOE with significant exertion. No DOE doing regular chores    Current Outpatient Prescriptions on File Prior to Visit  Medication Sig Dispense Refill  . atorvastatin (LIPITOR) 80 MG tablet TAKE 1 TABLET DAILY 90 tablet 1  . diltiazem (CARDIZEM CD) 120 MG 24 hr capsule TAKE 1 CAPSULE DAILY 90 capsule 2  . metFORMIN (GLUCOPHAGE) 500 MG tablet Take 500 mg by mouth 2 (two) times daily with a meal.      . metoprolol succinate (TOPROL-XL) 50 MG 24 hr tablet Take 1 tablet (50  mg total) by mouth daily. Take with or immediately following a meal. 90 tablet 3  . rivaroxaban (XARELTO) 20 MG TABS tablet Take 1 tablet (20 mg total) by mouth daily with supper. 90 tablet 1  . tamsulosin (FLOMAX) 0.4 MG CAPS capsule 0.4 mg daily.      No current facility-administered medications on file prior to visit.    No Known Allergies  Past Medical History  Diagnosis Date  . Diabetes mellitus Age 80  . Hyperlipidemia   . AAA (abdominal aortic aneurysm) ([place])   . Hypertension   . Coronary artery disease     post CABG x5 --Severe three-vessel coronary disease  . Arthritis   . Stroke [facility]) Oct. 1, 2013    Mini stroke- Afib  . Irregular heart beat  Feb 18, 2012  . Renal artery  stenosis ([place])   . Atrial fibrillation [facility])     Past Surgical History  Procedure Laterality Date  . Pr vein bypass graft,aorto-fem-pop  12/23/2005    Infrarenal abdominal aortic aneurysm, right common iliac occlusive disease --   . Colon surgery      ruptured colon  . Median sternotomy    . Hand surgery  1997  . Cataract extraction      left eye  . Skin graft  1992  . Cardiac catheterization  08/26/2005    Est. EF of 65-70% -- Severe three-vessel coronary artery disease -- very heavily calcified vessels and really there is no role for angioplasty  -- he heavily calcified vessels and really there is no role for angioplasty abdominal aortic aneurysm surgery  . Coronary artery bypass graft  09/02/2005    x 5 -- left internal mammary artery graft to the left anterior descending coronary artery, with a saphenous vein graft to the diagonal branch of the LAD, a sequential saphenous vein graft to the second and fourth obtuse marginal branches of the left circumflex coronary artery, and a saphenous vein graft to the posterior descending branch of RCA  -- Endoscopic vein harvesting from the left leg  . Laparotomy  1998  . Cardioversion  04/15/2012    Procedure: CARDIOVERSION;  Surgeon: Thayer Headings, MD;  Location: [facility];  Service: Cardiovascular;  Laterality: N/A;  . Eye surgery Left     Cataract  . Eye surgery Right     Cataract    History  Smoking status  . Former Smoker -- 2.00 packs/day for 44 years  . Types: Cigarettes  . Quit date: 05/20/1988  Smokeless tobacco  . Former Systems developer  . Quit date: 02/11/2003    History  Alcohol Use No    Family History  Problem Relation Age of Onset  . Coronary artery disease Mother   . Heart disease Mother     After age 15-Carotid  . Hyperlipidemia Mother   . Stroke Mother   . Coronary artery disease Father   . Hyperlipidemia Father   . Hypertension Father   . Varicose Veins Father   . Heart attack Father   . Coronary artery  disease Sister   . Heart disease Sister     After age 42- BP  . Heart attack Sister   . Diabetes Brother   . Heart disease Brother     After 19 yrs of age  . Heart attack Brother   . Hyperlipidemia Brother   . Alzheimer's disease Sister   . Seizures Sister     Reviw of Systems:  Reviewed in the HPI.  All  other systems are negative.  Physical Exam: BP 160/70 mmHg  Pulse 81  Ht 5\' 6"  (1.676 m)  Wt 174 lb 12.8 oz (79.289 kg)  BMI 28.23 kg/m2 The patient is alert and oriented x 3.  The mood and affect are normal.   Skin: warm and dry.  Color is normal.    HEENT:   [place]/AT, no JVD, normal carotids Lungs: clear  Heart: irregularly irregular, no murmurs   Abdomen: good BS, non tender Extremities:  No  C/c/e Neuro:  Non focal    ECG: 06/12/2015: Atrial fibrillation with occasional premature ventricular contractions.  Nonspecific ST and T wave abnormalities.  Assessment / Plan:   1. Coronary artery disease-status post CABG - Gabriel Gutierrez is doing well. Is not having any angina. Continue current medications. 2. Abdominal aortic aneurysm-Status post stent grafting 3. Diabetes mellitus 4. Dyslipidemia - his lipid levels were checked by his general medical doctor. His cholesterol is normal. Continue current medications. I'll see him again in 6 months. 5. Hypertension - BP is a little elevated.   He is still eating salty foods.   Advised him to avoid the salt.  He will call if his BP remains elevated.   6. Atrial fibrillation -  We have tried cardioversion in the past but it lasted only 1 week. He's had a stroke. He's now on Xarelto. Continue current medications. He is scheduled for partial knee replacement. He should be at low risk for this upcoming surgery. He'll need to hold Xarelto the day prior to his surgery. He should resume the Xarelto as soon as possible after the surgery-preferably the following day.  7. History of stroke   Nahser, Wonda Cheng, MD  06/12/2015 8:39 AM    [facility],  [place], [place]  [address] Pager [phone] Phone: [phone]; Fax: [phone]   [facility]  [address] [place], [place]  [address] [phone]    Fax [phone]

## 2015-06-20 ENCOUNTER — Encounter: Payer: Self-pay | Admitting: Neurology

## 2015-06-20 ENCOUNTER — Ambulatory Visit (INDEPENDENT_AMBULATORY_CARE_PROVIDER_SITE_OTHER): Payer: Medicare Other | Admitting: Neurology

## 2015-06-20 VITALS — BP 128/79 | HR 81 | Ht 66.0 in | Wt 171.6 lb

## 2015-06-20 DIAGNOSIS — G3184 Mild cognitive impairment, so stated: Secondary | ICD-10-CM | POA: Diagnosis not present

## 2015-06-20 DIAGNOSIS — I25810 Atherosclerosis of coronary artery bypass graft(s) without angina pectoris: Secondary | ICD-10-CM

## 2015-06-20 MED ORDER — CEREFOLIN 6-1-50-5 MG PO TABS: 1.0000 | ORAL_TABLET | ORAL | Status: DC

## 2015-06-20 NOTE — Progress Notes (Signed)
[facility] [address] [place]. [place] [address] [phone]       OFFICE FOLLOW UP VISIT NOTE  [name] Date of Birth:  Dec 22, 1935 Medical Record Number:  [id number]   Referring MD:  Gabriel Gutierrez  Reason for Referral:  Vision and memory loss  HPI: 80 year male who had a transient episode of vision loss in the right eye on 01/28/14. He noticed cloudiness which she describes as a horizontal line in his right eye and he could not see below the line the bottom half but he was able to see above it. This lasted only a few minutes and recovered. He denied any accompanying of following headache for seeing zigzag patterns or lines. He is dizzy had a similar episode in November 2014 when he had bilateral vision loss involving the lower half of the field which also lasted only a minute or so. He did not seek any medical help her evaluation in that time period. She denies any chronic headaches, muscle aches, pain, scalp tenderness or jaw claudication. He was seen by me in October 2013 when he had a right parietal insular cortex infarct and was diagnosed with new-onset atrial fibrillation. He is done well from that stroke and recovered completely. He has been on Xarelto of and has been tolerated well without bleeding or bruising. He is in a complain of memory loss particularly not remembering dates and names as well as his hands. He gets frustrated from this and got treated recently. He however remains independent of activities of daily living. His wife handles the finances but the patient can drive and manage his own affairs. He states his blood pressure control has been quite good and his fasting sugars usually run below 120. Update 06/07/2014 : He returns for follow-up after last visit 6 months ago. He continues to do well and has not had a recurrent stroke or TIA symptoms. He saw ophthalmologist Dr. Samara Gutierrez who felt he must have had cholesterol emboli. The patient did  undergo MRI scan of the brain on 02/14/14 which showed no acute abnormality with changes of chronic microvascular ischemia and generalized cerebral atrophy. MRA of the brain and neck did not reveal significant extra or intracranial vascular stenosis. Lab work  On 02/02/14 showed normal vitamin B12, TSH and RPR. Today on the Mini-Mental Status exam score 28/30. He had a colonoscopy yesterday and had held his xarelto for 5 days. He had 3 polyps removed and plans on restarting Xarelto he has not been doing mentally challenging exercises. Update 12/06/2014 : He returns for follow-up after last visit 6 months ago. He is accompanied by his wife feels that his memory may be somewhat better. He is taking Cerefolin but finds it quite expensive. He continues to have trouble remembering mainly recent information the memory from the past seems quite good. He is independent in activities of daily living. There have been no changes in behavior, agitation, hallucinations or nightmares or delusions noted. He continues to be bothered by left knee pain and wears a knee brace. He has not had any recurrent stroke or TIA symptoms and remains on Xarelto which is tolerating well without significant bleeding or bruising. His blood pressure seems well controlled. Lipid profile has not been checked recently. Update 06/20/2015:  He returns for follow-up after last visit 6 months ago. He is accompanied by son. He states is doing well and his memory is quite stable. Acute symptoms are quite active. He does mentally challenging  activities like crossword puzzles. Is physically also quite active. He unfortunately is having severe left knee pain and requires knee replacement which is scheduled for a couple of weeks. He remains on Xarelto which is tolerating well without side effects. He had taken Cerefolin which I had prescribed at last visit but when he ran out of the prescription his did not call for refill. ROS:   14 system review of systems is  positive for hearing loss,  , memory loss and all other systems negative  PMH:  Past Medical History  Diagnosis Date  . Diabetes mellitus Age 11  . Hyperlipidemia   . AAA (abdominal aortic aneurysm) ([place])   . Hypertension   . Coronary artery disease     post CABG x5 --Severe three-vessel coronary disease  . Arthritis   . Stroke [facility]) Oct. 1, 2013    Mini stroke- Afib  . Irregular heart beat  Feb 18, 2012  . Renal artery stenosis ([place])   . Atrial fibrillation [facility])     Social History:  Social History   Social History  . Marital Status: Married    Spouse Name: N/A  . Number of Children: N/A  . Years of Education: N/A   Occupational History  . Not on file.   Social History Main Topics  . Smoking status: Former Smoker -- 2.00 packs/day for 44 years    Types: Cigarettes    Quit date: 05/20/1988  . Smokeless tobacco: Former Systems developer    Quit date: 02/11/2003  . Alcohol Use: No  . Drug Use: No  . Sexual Activity: No   Other Topics Concern  . Not on file   Social History Narrative    Medications:   Current Outpatient Prescriptions on File Prior to Visit  Medication Sig Dispense Refill  . atorvastatin (LIPITOR) 80 MG tablet TAKE 1 TABLET DAILY 90 tablet 1  . diltiazem (CARDIZEM CD) 120 MG 24 hr capsule TAKE 1 CAPSULE DAILY 90 capsule 2  . metFORMIN (GLUCOPHAGE) 500 MG tablet Take 500 mg by mouth 2 (two) times daily with a meal.      . metoprolol succinate (TOPROL-XL) 50 MG 24 hr tablet Take 1 tablet (50 mg total) by mouth daily. Take with or immediately following a meal. 90 tablet 3  . rivaroxaban (XARELTO) 20 MG TABS tablet Take 1 tablet (20 mg total) by mouth daily with supper. 90 tablet 1  . tamsulosin (FLOMAX) 0.4 MG CAPS capsule 0.4 mg daily.      No current facility-administered medications on file prior to visit.    Allergies:  No Known Allergies  Physical Exam General: well developed, well nourished elderly male, seated, in no evident distress Head: head  normocephalic and atraumatic.  Neck: supple with no carotid or supraclavicular bruits Cardiovascular: regular rate and rhythm, no murmurs Musculoskeletal: no deformity Skin:  no rash/petichiae Vascular:  Normal pulses all extremities Filed Vitals:   06/20/15 1325  BP: 128/79  Pulse: 81    Neurologic Exam Mental Status: Awake and fully alert. Oriented to place and time. Recent and remote memory diminished. Attention span, concentration and fund of knowledge appropriate. Mood and affect appropriate. Mini-Mental status exam score 30/30 which is slightly better than 29/30 at last visit. Clock drawing 4/4. Animal naming test 8. Cranial Nerves: Fundoscopic exam not done . Pupils equal, briskly reactive to light. Extraocular movements full without nystagmus. Visual fields full to confrontation. Hearing diminished bilaterally. Facial sensation intact. Face, tongue, palate moves normally and symmetrically.  Motor:  Normal bulk and tone. Normal strength in all tested extremity muscles. Sensory.: intact to touch and pinprick and vibratory sensation.  Coordination: Rapid alternating movements normal in all extremities. Finger-to-nose and heel-to-shin performed accurately bilaterally. Gait and Station: Arises from chair without difficulty. Stance is normal. Gait demonstrates normal stride length and balance . Unable to heel, toe and tandem walk without difficulty.  Reflexes: 1+ and symmetric. Toes downgoing.     ASSESSMENT: 80 year male with transient episodes of visual loss of unclear etiology possibly small vessel retinal TIAs. He complains of memory and cognitive difficulties likely from mild cognitive impairment but strong family history of Alzheimer's and is concerning. Remote history of embolic right MCA branch infarct in October 2013 from atrial fibrillation with good neurological recovery    PLAN: I had a long discussion with the patient and his son regarding his mild cognitive impairment  which appears to be stable. I recommend he resume taking Cerefolin NAC daily as well as add Reservetarol 200 mg daily. He was encouraged to continue participation in cognitively challenging activities like solving crossword puzzles, sudoku. Patient was given neurological clearance to undergo left knee replacement and was advised to hold the Xarelto for 3 days prior to the procedure with a small but acceptable peri-procedural risk of TIA/stroke and to resume it after the procedure when safe. He was advised to continue maintaining strict control of lipids with LDL cholesterol goal below 70 mg percent, hypertension with blood pressure goal below 130/90 and diabetes with hemoglobin A1c goal below 6.5.%  Antony Contras, MD Note: This document was prepared with digital dictation and possible smart phrase technology. Any transcriptional errors that result from this process are unintentional.

## 2015-06-20 NOTE — Patient Instructions (Signed)
I had a long discussion with the patient and his son regarding his mild cognitive impairment which appears to be stable. I recommend he resume taking Cerefolin NAC daily as well as add Reservetarol 200 mg daily. He was encouraged to continue participation in cognitively challenging activities like solving crossword puzzles, sudoku. Patient was given neurological clearance to undergo left knee replacement and was advised to hold the Xarelto for 3 days prior to the procedure with a small but acceptable peri-procedural risk of TIA/stroke and to resume it after the procedure when safe. He was advised to continue maintaining strict control of lipids with LDL cholesterol goal below 70 mg percent, hypertension with blood pressure goal below 130/90 and diabetes with hemoglobin A1c goal below 6.5.%

## 2015-06-27 ENCOUNTER — Other Ambulatory Visit: Payer: Self-pay | Admitting: Orthopedic Surgery

## 2015-07-07 ENCOUNTER — Encounter (HOSPITAL_COMMUNITY): Payer: Self-pay

## 2015-07-07 ENCOUNTER — Encounter (HOSPITAL_COMMUNITY): Admission: RE | Admit: 2015-07-07 | Payer: Medicare Other | Source: Ambulatory Visit

## 2015-07-07 ENCOUNTER — Encounter (HOSPITAL_COMMUNITY)
Admission: RE | Admit: 2015-07-07 | Discharge: 2015-07-07 | Disposition: A | Discharge status: Home / Self Care | Payer: Medicare Other | Source: Ambulatory Visit | Attending: Orthopedic Surgery | Admitting: Orthopedic Surgery

## 2015-07-07 DIAGNOSIS — Z79899 Other long term (current) drug therapy: Secondary | ICD-10-CM | POA: Diagnosis not present

## 2015-07-07 DIAGNOSIS — I10 Essential (primary) hypertension: Secondary | ICD-10-CM | POA: Insufficient documentation

## 2015-07-07 DIAGNOSIS — E119 Type 2 diabetes mellitus without complications: Secondary | ICD-10-CM | POA: Diagnosis not present

## 2015-07-07 DIAGNOSIS — Z951 Presence of aortocoronary bypass graft: Secondary | ICD-10-CM | POA: Diagnosis not present

## 2015-07-07 DIAGNOSIS — I4891 Unspecified atrial fibrillation: Secondary | ICD-10-CM | POA: Insufficient documentation

## 2015-07-07 DIAGNOSIS — I2510 Atherosclerotic heart disease of native coronary artery without angina pectoris: Secondary | ICD-10-CM | POA: Insufficient documentation

## 2015-07-07 DIAGNOSIS — Z01818 Encounter for other preprocedural examination: Secondary | ICD-10-CM | POA: Diagnosis not present

## 2015-07-07 DIAGNOSIS — Z8673 Personal history of transient ischemic attack (TIA), and cerebral infarction without residual deficits: Secondary | ICD-10-CM | POA: Diagnosis not present

## 2015-07-07 DIAGNOSIS — E785 Hyperlipidemia, unspecified: Secondary | ICD-10-CM | POA: Insufficient documentation

## 2015-07-07 DIAGNOSIS — Z7984 Long term (current) use of oral hypoglycemic drugs: Secondary | ICD-10-CM | POA: Insufficient documentation

## 2015-07-07 DIAGNOSIS — M1712 Unilateral primary osteoarthritis, left knee: Secondary | ICD-10-CM | POA: Diagnosis not present

## 2015-07-07 DIAGNOSIS — I701 Atherosclerosis of renal artery: Secondary | ICD-10-CM | POA: Insufficient documentation

## 2015-07-07 DIAGNOSIS — Z7902 Long term (current) use of antithrombotics/antiplatelets: Secondary | ICD-10-CM | POA: Insufficient documentation

## 2015-07-07 DIAGNOSIS — Z01812 Encounter for preprocedural laboratory examination: Secondary | ICD-10-CM | POA: Insufficient documentation

## 2015-07-07 DIAGNOSIS — Z87891 Personal history of nicotine dependence: Secondary | ICD-10-CM | POA: Diagnosis not present

## 2015-07-07 HISTORY — DX: Cardiac arrhythmia, unspecified: I49.9

## 2015-07-07 LAB — URINE MICROSCOPIC-ADD ON

## 2015-07-07 LAB — COMPREHENSIVE METABOLIC PANEL
ALT: 18 U/L (ref 17–63)
AST: 25 U/L (ref 15–41)
Albumin: 3.9 g/dL (ref 3.5–5.0)
Alkaline Phosphatase: 87 U/L (ref 38–126)
Anion gap: 8 (ref 5–15)
BILIRUBIN TOTAL: 0.4 mg/dL (ref 0.3–1.2)
BUN: 17 mg/dL (ref 6–20)
CO2: 27 mmol/L (ref 22–32)
CREATININE: 1.3 mg/dL — AB (ref 0.61–1.24)
Calcium: 9.5 mg/dL (ref 8.9–10.3)
Chloride: 108 mmol/L (ref 101–111)
GFR calc Af Amer: 59 mL/min — ABNORMAL LOW (ref >60)
GFR, EST NON AFRICAN AMERICAN: 51 mL/min — AB (ref >60)
Glucose, Bld: 114 mg/dL — ABNORMAL HIGH (ref 65–99)
Potassium: 5.1 mmol/L (ref 3.5–5.1)
Sodium: 143 mmol/L (ref 135–145)
TOTAL PROTEIN: 6.3 g/dL — AB (ref 6.5–8.1)

## 2015-07-07 LAB — CBC WITH DIFFERENTIAL/PLATELET
BASOS ABS: 0 10*3/uL (ref 0.0–0.1)
Basophils Relative: 0 %
Eosinophils Absolute: 0.2 10*3/uL (ref 0.0–0.7)
Eosinophils Relative: 2 %
HEMATOCRIT: 44.1 % (ref 39.0–52.0)
Hemoglobin: 14.6 g/dL (ref 13.0–17.0)
LYMPHS ABS: 1.2 10*3/uL (ref 0.7–4.0)
LYMPHS PCT: 14 %
MCH: 29.3 pg (ref 26.0–34.0)
MCHC: 33.1 g/dL (ref 30.0–36.0)
MCV: 88.6 fL (ref 78.0–100.0)
MONO ABS: 0.9 10*3/uL (ref 0.1–1.0)
Monocytes Relative: 10 %
NEUTROS ABS: 6.4 10*3/uL (ref 1.7–7.7)
Neutrophils Relative %: 74 %
Platelets: 249 10*3/uL (ref 150–400)
RBC: 4.98 MIL/uL (ref 4.22–5.81)
RDW: 13.3 % (ref 11.5–15.5)
WBC: 8.7 10*3/uL (ref 4.0–10.5)

## 2015-07-07 LAB — URINALYSIS, ROUTINE W REFLEX MICROSCOPIC
BILIRUBIN URINE: NEGATIVE
GLUCOSE, UA: NEGATIVE mg/dL
KETONES UR: NEGATIVE mg/dL
Leukocytes, UA: NEGATIVE
Nitrite: NEGATIVE
PH: 5.5 (ref 5.0–8.0)
Protein, ur: NEGATIVE mg/dL
Specific Gravity, Urine: 1.024 (ref 1.005–1.030)

## 2015-07-07 LAB — PROTIME-INR
INR: 2.04 — ABNORMAL HIGH (ref 0.00–1.49)
PROTHROMBIN TIME: 22.9 s — AB (ref 11.6–15.2)

## 2015-07-07 LAB — SURGICAL PCR SCREEN
MRSA, PCR: NEGATIVE
Staphylococcus aureus: NEGATIVE

## 2015-07-07 LAB — GLUCOSE, CAPILLARY: GLUCOSE-CAPILLARY: 110 mg/dL — AB (ref 65–99)

## 2015-07-07 LAB — APTT: APTT: 46 s — AB (ref 24–37)

## 2015-07-07 NOTE — Pre-Procedure Instructions (Signed)
    Gabriel Gutierrez  07/07/2015      [facility] - [place], [place] [address] [place] [address] Phone: [phone] Fax: [phone]  [id number] [place], [place] [address] Phone: [phone] Fax: [phone]    Your procedure is scheduled on 07/17/15.  Report to [facility] Admitting at 9:20 A.M.  Call this number if you have problems the morning of surgery:  [phone]   Remember:  Do not eat food or drink liquids after midnight.  Take these medicines the morning of surgery with A SIP OF WATER --ALL INHALERS,CARDZEM,FLOMAX,METOPROLOL   Do not wear jewelry, make-up or nail polish.  Do not wear lotions, powders, or perfumes.  You may wear deodorant.  Do not shave 48 hours prior to surgery.  Men may shave face and neck.  Do not bring valuables to the hospital.  [facility] is not responsible for any belongings or valuables.  Contacts, dentures or bridgework may not be worn into surgery.  Leave your suitcase in the car.  After surgery it may be brought to your room.  For patients admitted to the hospital, discharge time will be determined by your treatment team.  Patients discharged the day of surgery will not be allowed to drive home.   Name and phone number of your driver:   Special instructions:  Please read over the following fact sheets that you were given. Pain Booklet, Coughing and Deep Breathing, MRSA Information and Surgical Site Infection Prevention

## 2015-07-09 LAB — URINE CULTURE

## 2015-07-10 ENCOUNTER — Telehealth: Payer: Self-pay | Admitting: Cardiovascular Disease

## 2015-07-10 ENCOUNTER — Other Ambulatory Visit: Payer: Self-pay | Admitting: Orthopedic Surgery

## 2015-07-10 NOTE — Progress Notes (Addendum)
Anesthesia Chart Review:  Pt is a 80 year old male scheduled for L unicompartmental knee on 07/17/2015 with Dr. Ronnie Derby.   Cardiologist is Dr. Acie Fredrickson.   PMH includes:  CAD (s/p CABG x5 2007), atrial fibrillation, HTN, DM, hyperlipidemia, AAA (s/p EVAR 2007), stroke (2013), renal artery stenosis. Former smoker. BMI 27.5  Medications include: albuterol, lipitor, diltiazem, metformin, metoprolol, xarelto. Pt to stop xarelto 07/13/15.  Preoperative labs reviewed.  PTT 46, PT 22.9. Will repeat DOS.   EKG 06/12/15: atrial fibrillation with PVC or aberrantly conducted complexes. Nonspecific ST and T wave abnormalities.   Carotid duplex 08/02/14: B ICA stenosis <40%  Renal artery duplex 08/02/14:  - R RA is patent, <60% stenosis - L RA stenosis >60% - Essentially unchanged since previous study 01/19/14.  Echo 02/18/12:  - Left ventricle: The cavity size was normal. Wall thickness was increased in a pattern of mild LVH. Systolic function was normal. The estimated ejection fraction was in the range of 60% to 65%. - Aortic valve: Trivial regurgitation. - Mitral valve: Mild prolapse, involving the posterior leaflet. Mild regurgitation. - Pulmonary arteries: PA peak pressure: 81mm Hg (S).  Pt has cardiac clearance from Dr. Acie Fredrickson.   If no changes, I anticipate pt can proceed with surgery as scheduled.   Willeen Cass, FNP-BC [facility]/Anesthesiology Phone: [phone] 07/11/2015 3:17 PM

## 2015-07-10 NOTE — Telephone Encounter (Addendum)
New message      They received clearance from Dr Acie Fredrickson to stop xarelto 1 day prior to surgery.  However, they need pt to stop xarelto 3 days prior.  If ok, please send another clearance thru epic.

## 2015-07-10 NOTE — Telephone Encounter (Signed)
Pt may hold Xarelto for 3 days prior to surgery

## 2015-07-14 MED ORDER — BUPIVACAINE LIPOSOME 1.3 % IJ SUSP
20.0000 mL | Freq: Once | INTRAMUSCULAR | Status: DC
  Filled 2015-07-14 (×2): qty 20

## 2015-07-14 MED ORDER — TRANEXAMIC ACID 1000 MG/10ML IV SOLN
2000.0000 mg | INTRAVENOUS | Status: AC
  Filled 2015-07-14: qty 20

## 2015-07-16 MED ORDER — CEFAZOLIN SODIUM-DEXTROSE 2-3 GM-% IV SOLR
2.0000 g | INTRAVENOUS | Status: AC
  Administered 2015-07-17: 2 g via INTRAVENOUS
  Filled 2015-07-16: qty 50

## 2015-07-17 ENCOUNTER — Ambulatory Visit (HOSPITAL_COMMUNITY): Payer: Medicare Other | Admitting: Emergency Medicine

## 2015-07-17 ENCOUNTER — Observation Stay (HOSPITAL_COMMUNITY)
Admission: RE | Admit: 2015-07-17 | Discharge: 2015-07-18 | Disposition: A | Discharge status: Home / Self Care | Payer: Medicare Other | Source: Ambulatory Visit | Attending: Orthopedic Surgery | Admitting: Orthopedic Surgery

## 2015-07-17 ENCOUNTER — Encounter (HOSPITAL_COMMUNITY): Payer: Self-pay | Admitting: Anesthesiology

## 2015-07-17 ENCOUNTER — Ambulatory Visit (HOSPITAL_COMMUNITY): Payer: Medicare Other | Admitting: Anesthesiology

## 2015-07-17 ENCOUNTER — Encounter (HOSPITAL_COMMUNITY)
Admission: RE | Disposition: A | Discharge status: Home / Self Care | Payer: Self-pay | Source: Ambulatory Visit | Attending: Orthopedic Surgery

## 2015-07-17 DIAGNOSIS — I2510 Atherosclerotic heart disease of native coronary artery without angina pectoris: Secondary | ICD-10-CM | POA: Diagnosis not present

## 2015-07-17 DIAGNOSIS — Z8673 Personal history of transient ischemic attack (TIA), and cerebral infarction without residual deficits: Secondary | ICD-10-CM | POA: Insufficient documentation

## 2015-07-17 DIAGNOSIS — E785 Hyperlipidemia, unspecified: Secondary | ICD-10-CM | POA: Diagnosis not present

## 2015-07-17 DIAGNOSIS — Z7901 Long term (current) use of anticoagulants: Secondary | ICD-10-CM | POA: Insufficient documentation

## 2015-07-17 DIAGNOSIS — G8918 Other acute postprocedural pain: Secondary | ICD-10-CM | POA: Diagnosis not present

## 2015-07-17 DIAGNOSIS — I10 Essential (primary) hypertension: Secondary | ICD-10-CM | POA: Insufficient documentation

## 2015-07-17 DIAGNOSIS — D62 Acute posthemorrhagic anemia: Secondary | ICD-10-CM | POA: Insufficient documentation

## 2015-07-17 DIAGNOSIS — I714 Abdominal aortic aneurysm, without rupture: Secondary | ICD-10-CM | POA: Insufficient documentation

## 2015-07-17 DIAGNOSIS — Z96652 Presence of left artificial knee joint: Secondary | ICD-10-CM

## 2015-07-17 DIAGNOSIS — I4891 Unspecified atrial fibrillation: Secondary | ICD-10-CM | POA: Diagnosis not present

## 2015-07-17 DIAGNOSIS — Z7984 Long term (current) use of oral hypoglycemic drugs: Secondary | ICD-10-CM | POA: Diagnosis not present

## 2015-07-17 DIAGNOSIS — M1712 Unilateral primary osteoarthritis, left knee: Secondary | ICD-10-CM | POA: Diagnosis not present

## 2015-07-17 DIAGNOSIS — Z951 Presence of aortocoronary bypass graft: Secondary | ICD-10-CM | POA: Diagnosis not present

## 2015-07-17 DIAGNOSIS — E1151 Type 2 diabetes mellitus with diabetic peripheral angiopathy without gangrene: Secondary | ICD-10-CM | POA: Diagnosis not present

## 2015-07-17 DIAGNOSIS — Z79899 Other long term (current) drug therapy: Secondary | ICD-10-CM | POA: Diagnosis not present

## 2015-07-17 DIAGNOSIS — M179 Osteoarthritis of knee, unspecified: Secondary | ICD-10-CM | POA: Diagnosis not present

## 2015-07-17 DIAGNOSIS — Z87891 Personal history of nicotine dependence: Secondary | ICD-10-CM | POA: Diagnosis not present

## 2015-07-17 HISTORY — PX: PARTIAL KNEE ARTHROPLASTY: SHX2174

## 2015-07-17 LAB — CBC
HCT: 42.8 % (ref 39.0–52.0)
Hemoglobin: 14.2 g/dL (ref 13.0–17.0)
MCH: 29.2 pg (ref 26.0–34.0)
MCHC: 33.2 g/dL (ref 30.0–36.0)
MCV: 87.9 fL (ref 78.0–100.0)
Platelets: 237 10*3/uL (ref 150–400)
RBC: 4.87 MIL/uL (ref 4.22–5.81)
RDW: 13.3 % (ref 11.5–15.5)
WBC: 10.6 10*3/uL — ABNORMAL HIGH (ref 4.0–10.5)

## 2015-07-17 LAB — GLUCOSE, CAPILLARY
Glucose-Capillary: 105 mg/dL — ABNORMAL HIGH (ref 65–99)
Glucose-Capillary: 111 mg/dL — ABNORMAL HIGH (ref 65–99)
Glucose-Capillary: 118 mg/dL — ABNORMAL HIGH (ref 65–99)
Glucose-Capillary: 159 mg/dL — ABNORMAL HIGH (ref 65–99)

## 2015-07-17 LAB — CREATININE, SERUM
CREATININE: 1.21 mg/dL (ref 0.61–1.24)
GFR calc Af Amer: >60 mL/min (ref >60)
GFR, EST NON AFRICAN AMERICAN: 56 mL/min — AB (ref >60)

## 2015-07-17 LAB — PROTIME-INR
INR: 1.1 (ref 0.00–1.49)
Prothrombin Time: 14.4 seconds (ref 11.6–15.2)

## 2015-07-17 LAB — APTT: aPTT: 33 seconds (ref 24–37)

## 2015-07-17 SURGERY — ARTHROPLASTY, KNEE, UNICOMPARTMENTAL
Anesthesia: Regional | Laterality: Left

## 2015-07-17 MED ORDER — METOCLOPRAMIDE HCL 5 MG PO TABS: 5.0000 mg | ORAL_TABLET | Freq: Three times a day (TID) | ORAL | Status: DC | PRN

## 2015-07-17 MED ORDER — ZOLPIDEM TARTRATE 5 MG PO TABS: 5.0000 mg | ORAL_TABLET | Freq: Every evening | ORAL | Status: DC | PRN

## 2015-07-17 MED ORDER — CHLORHEXIDINE GLUCONATE 4 % EX LIQD: 60.0000 mL | Freq: Once | CUTANEOUS | Status: DC

## 2015-07-17 MED ORDER — MIDAZOLAM HCL 2 MG/2ML IJ SOLN
INTRAMUSCULAR | Status: AC
  Administered 2015-07-17: 2 mg via INTRAVENOUS
  Filled 2015-07-17: qty 2

## 2015-07-17 MED ORDER — MENTHOL 3 MG MT LOZG: 1.0000 | LOZENGE | OROMUCOSAL | Status: DC | PRN

## 2015-07-17 MED ORDER — EPHEDRINE SULFATE 50 MG/ML IJ SOLN
INTRAMUSCULAR | Status: DC | PRN
  Administered 2015-07-17 (×2): 5 mg via INTRAVENOUS

## 2015-07-17 MED ORDER — TRANEXAMIC ACID 1000 MG/10ML IV SOLN
2000.0000 mg | INTRAVENOUS | Status: DC
  Filled 2015-07-17: qty 20

## 2015-07-17 MED ORDER — TRANEXAMIC ACID 1000 MG/10ML IV SOLN: 1000.0000 mg | Freq: Once | INTRAVENOUS | Status: DC

## 2015-07-17 MED ORDER — PHENYLEPHRINE 40 MCG/ML (10ML) SYRINGE FOR IV PUSH (FOR BLOOD PRESSURE SUPPORT)
PREFILLED_SYRINGE | INTRAVENOUS | Status: AC
  Filled 2015-07-17: qty 40

## 2015-07-17 MED ORDER — OXYCODONE HCL ER 10 MG PO T12A
10.0000 mg | EXTENDED_RELEASE_TABLET | Freq: Two times a day (BID) | ORAL | Status: DC
Start: 2015-07-17 — End: 2015-07-18
  Administered 2015-07-17 – 2015-07-18 (×3): 10 mg via ORAL
  Filled 2015-07-17 (×3): qty 1

## 2015-07-17 MED ORDER — DOCUSATE SODIUM 100 MG PO CAPS
100.0000 mg | ORAL_CAPSULE | Freq: Two times a day (BID) | ORAL | Status: DC
  Administered 2015-07-17 – 2015-07-18 (×2): 100 mg via ORAL
  Filled 2015-07-17 (×2): qty 1

## 2015-07-17 MED ORDER — BUPIVACAINE LIPOSOME 1.3 % IJ SUSP
INTRAMUSCULAR | Status: DC | PRN
  Administered 2015-07-17: 20 mL

## 2015-07-17 MED ORDER — LACTATED RINGERS IV SOLN
INTRAVENOUS | Status: DC
  Administered 2015-07-17 (×2): via INTRAVENOUS

## 2015-07-17 MED ORDER — ALBUTEROL SULFATE (2.5 MG/3ML) 0.083% IN NEBU: 2.5000 mg | INHALATION_SOLUTION | Freq: Four times a day (QID) | RESPIRATORY_TRACT | Status: DC | PRN

## 2015-07-17 MED ORDER — HYDROMORPHONE HCL 1 MG/ML IJ SOLN: 0.2500 mg | INTRAMUSCULAR | Status: DC | PRN

## 2015-07-17 MED ORDER — DEXAMETHASONE SODIUM PHOSPHATE 4 MG/ML IJ SOLN
INTRAMUSCULAR | Status: AC
  Filled 2015-07-17: qty 2

## 2015-07-17 MED ORDER — FENTANYL CITRATE (PF) 100 MCG/2ML IJ SOLN
INTRAMUSCULAR | Status: DC | PRN
  Administered 2015-07-17 (×2): 25 ug via INTRAVENOUS

## 2015-07-17 MED ORDER — ATORVASTATIN CALCIUM 80 MG PO TABS
80.0000 mg | ORAL_TABLET | Freq: Every evening | ORAL | Status: DC
Start: 2015-07-17 — End: 2015-07-18
  Administered 2015-07-17: 80 mg via ORAL
  Filled 2015-07-17: qty 1

## 2015-07-17 MED ORDER — METHOCARBAMOL 500 MG PO TABS: 500.0000 mg | ORAL_TABLET | Freq: Four times a day (QID) | ORAL | Status: DC | PRN

## 2015-07-17 MED ORDER — DEXAMETHASONE SODIUM PHOSPHATE 4 MG/ML IJ SOLN
INTRAMUSCULAR | Status: DC | PRN
  Administered 2015-07-17: 4 mg via INTRAVENOUS

## 2015-07-17 MED ORDER — PHENYLEPHRINE HCL 10 MG/ML IJ SOLN
INTRAMUSCULAR | Status: DC | PRN
  Administered 2015-07-17: 120 ug via INTRAVENOUS
  Administered 2015-07-17 (×2): 80 ug via INTRAVENOUS

## 2015-07-17 MED ORDER — PROPOFOL 10 MG/ML IV BOLUS
INTRAVENOUS | Status: AC
  Filled 2015-07-17: qty 20

## 2015-07-17 MED ORDER — LIDOCAINE HCL (CARDIAC) 20 MG/ML IV SOLN
INTRAVENOUS | Status: AC
  Filled 2015-07-17: qty 5

## 2015-07-17 MED ORDER — ACETAMINOPHEN 325 MG PO TABS: 650.0000 mg | ORAL_TABLET | Freq: Four times a day (QID) | ORAL | Status: DC | PRN

## 2015-07-17 MED ORDER — ENOXAPARIN SODIUM 30 MG/0.3ML ~~LOC~~ SOLN
30.0000 mg | Freq: Two times a day (BID) | SUBCUTANEOUS | Status: DC
  Administered 2015-07-18: 30 mg via SUBCUTANEOUS
  Filled 2015-07-17: qty 0.3

## 2015-07-17 MED ORDER — SENNOSIDES-DOCUSATE SODIUM 8.6-50 MG PO TABS: 1.0000 | ORAL_TABLET | Freq: Every evening | ORAL | Status: DC | PRN

## 2015-07-17 MED ORDER — FENTANYL CITRATE (PF) 100 MCG/2ML IJ SOLN
INTRAMUSCULAR | Status: AC
  Administered 2015-07-17: 50 ug via INTRAVENOUS
  Filled 2015-07-17: qty 2

## 2015-07-17 MED ORDER — BUPIVACAINE-EPINEPHRINE 0.5% -1:200000 IJ SOLN
INTRAMUSCULAR | Status: DC | PRN
  Administered 2015-07-17: 30 mL

## 2015-07-17 MED ORDER — BUPIVACAINE-EPINEPHRINE (PF) 0.5% -1:200000 IJ SOLN
INTRAMUSCULAR | Status: AC
  Filled 2015-07-17: qty 30

## 2015-07-17 MED ORDER — CELECOXIB 200 MG PO CAPS
200.0000 mg | ORAL_CAPSULE | Freq: Two times a day (BID) | ORAL | Status: DC
  Administered 2015-07-17 – 2015-07-18 (×3): 200 mg via ORAL
  Filled 2015-07-17 (×3): qty 1

## 2015-07-17 MED ORDER — METFORMIN HCL 500 MG PO TABS
500.0000 mg | ORAL_TABLET | Freq: Two times a day (BID) | ORAL | Status: DC
Start: 2015-07-17 — End: 2015-07-18
  Administered 2015-07-17 – 2015-07-18 (×2): 500 mg via ORAL
  Filled 2015-07-17 (×2): qty 1

## 2015-07-17 MED ORDER — ACETAMINOPHEN 650 MG RE SUPP: 650.0000 mg | Freq: Four times a day (QID) | RECTAL | Status: DC | PRN

## 2015-07-17 MED ORDER — SODIUM CHLORIDE 0.9 % IJ SOLN
INTRAMUSCULAR | Status: DC | PRN
  Administered 2015-07-17: 20 mL via INTRAVENOUS

## 2015-07-17 MED ORDER — BISACODYL 5 MG PO TBEC: 5.0000 mg | DELAYED_RELEASE_TABLET | Freq: Every day | ORAL | Status: DC | PRN

## 2015-07-17 MED ORDER — CEFAZOLIN SODIUM-DEXTROSE 2-3 GM-% IV SOLR
2.0000 g | Freq: Four times a day (QID) | INTRAVENOUS | Status: AC
  Administered 2015-07-17 – 2015-07-18 (×2): 2 g via INTRAVENOUS
  Filled 2015-07-17 (×2): qty 50

## 2015-07-17 MED ORDER — METHOCARBAMOL 1000 MG/10ML IJ SOLN
500.0000 mg | Freq: Four times a day (QID) | INTRAMUSCULAR | Status: DC | PRN
  Filled 2015-07-17: qty 5

## 2015-07-17 MED ORDER — PHENYLEPHRINE HCL 10 MG/ML IJ SOLN
10.0000 mg | INTRAVENOUS | Status: DC | PRN
  Administered 2015-07-17: 40 ug/min via INTRAVENOUS

## 2015-07-17 MED ORDER — ONDANSETRON HCL 4 MG PO TABS: 4.0000 mg | ORAL_TABLET | Freq: Four times a day (QID) | ORAL | Status: DC | PRN

## 2015-07-17 MED ORDER — PROMETHAZINE HCL 25 MG/ML IJ SOLN: 6.2500 mg | INTRAMUSCULAR | Status: DC | PRN

## 2015-07-17 MED ORDER — INSULIN ASPART 100 UNIT/ML ~~LOC~~ SOLN: 0.0000 [IU] | Freq: Every day | SUBCUTANEOUS | Status: DC

## 2015-07-17 MED ORDER — FENTANYL CITRATE (PF) 250 MCG/5ML IJ SOLN
INTRAMUSCULAR | Status: AC
  Filled 2015-07-17: qty 5

## 2015-07-17 MED ORDER — ONDANSETRON HCL 4 MG/2ML IJ SOLN
INTRAMUSCULAR | Status: AC
  Filled 2015-07-17: qty 2

## 2015-07-17 MED ORDER — ALBUTEROL SULFATE HFA 108 (90 BASE) MCG/ACT IN AERS: 1.0000 | INHALATION_SPRAY | Freq: Four times a day (QID) | RESPIRATORY_TRACT | Status: DC | PRN

## 2015-07-17 MED ORDER — METOCLOPRAMIDE HCL 5 MG/ML IJ SOLN: 5.0000 mg | Freq: Three times a day (TID) | INTRAMUSCULAR | Status: DC | PRN

## 2015-07-17 MED ORDER — OXYCODONE HCL 5 MG PO TABS
5.0000 mg | ORAL_TABLET | ORAL | Status: DC | PRN
  Administered 2015-07-18: 5 mg via ORAL
  Filled 2015-07-17: qty 1

## 2015-07-17 MED ORDER — ONDANSETRON HCL 4 MG/2ML IJ SOLN
INTRAMUSCULAR | Status: DC | PRN
  Administered 2015-07-17 (×2): 4 mg via INTRAVENOUS

## 2015-07-17 MED ORDER — FLEET ENEMA 7-19 GM/118ML RE ENEM: 1.0000 | ENEMA | Freq: Once | RECTAL | Status: DC | PRN

## 2015-07-17 MED ORDER — FENTANYL CITRATE (PF) 100 MCG/2ML IJ SOLN
50.0000 ug | Freq: Once | INTRAMUSCULAR | Status: AC
  Administered 2015-07-17: 50 ug via INTRAVENOUS

## 2015-07-17 MED ORDER — DILTIAZEM HCL ER COATED BEADS 120 MG PO CP24
120.0000 mg | ORAL_CAPSULE | Freq: Every day | ORAL | Status: DC
  Administered 2015-07-18: 120 mg via ORAL
  Filled 2015-07-17: qty 1

## 2015-07-17 MED ORDER — PROPOFOL 10 MG/ML IV BOLUS
INTRAVENOUS | Status: DC | PRN
  Administered 2015-07-17: 170 mg via INTRAVENOUS

## 2015-07-17 MED ORDER — MIDAZOLAM HCL 2 MG/2ML IJ SOLN
2.0000 mg | Freq: Once | INTRAMUSCULAR | Status: AC
  Administered 2015-07-17: 2 mg via INTRAVENOUS

## 2015-07-17 MED ORDER — 0.9 % SODIUM CHLORIDE (POUR BTL) OPTIME
TOPICAL | Status: DC | PRN
  Administered 2015-07-17: 1000 mL

## 2015-07-17 MED ORDER — PHENOL 1.4 % MT LIQD: 1.0000 | OROMUCOSAL | Status: DC | PRN

## 2015-07-17 MED ORDER — SODIUM CHLORIDE 0.9 % IV SOLN
INTRAVENOUS | Status: DC
  Administered 2015-07-17: 17:00:00 via INTRAVENOUS

## 2015-07-17 MED ORDER — HYDROMORPHONE HCL 1 MG/ML IJ SOLN: 0.5000 mg | INTRAMUSCULAR | Status: DC | PRN

## 2015-07-17 MED ORDER — ONDANSETRON HCL 4 MG/2ML IJ SOLN: 4.0000 mg | Freq: Four times a day (QID) | INTRAMUSCULAR | Status: DC | PRN

## 2015-07-17 MED ORDER — DIPHENHYDRAMINE HCL 12.5 MG/5ML PO ELIX: 12.5000 mg | ORAL_SOLUTION | ORAL | Status: DC | PRN

## 2015-07-17 MED ORDER — ALUM & MAG HYDROXIDE-SIMETH 200-200-20 MG/5ML PO SUSP: 30.0000 mL | ORAL | Status: DC | PRN

## 2015-07-17 MED ORDER — METOPROLOL TARTRATE 25 MG PO TABS
25.0000 mg | ORAL_TABLET | Freq: Every day | ORAL | Status: DC
  Administered 2015-07-18: 25 mg via ORAL
  Filled 2015-07-17: qty 1

## 2015-07-17 MED ORDER — SODIUM CHLORIDE 0.9 % IV SOLN: INTRAVENOUS | Status: DC

## 2015-07-17 MED ORDER — TAMSULOSIN HCL 0.4 MG PO CAPS
0.4000 mg | ORAL_CAPSULE | Freq: Every day | ORAL | Status: DC
  Administered 2015-07-17 – 2015-07-18 (×2): 0.4 mg via ORAL
  Filled 2015-07-17 (×2): qty 1

## 2015-07-17 MED ORDER — PHENYLEPHRINE HCL 10 MG/ML IJ SOLN
INTRAMUSCULAR | Status: AC
  Filled 2015-07-17: qty 1

## 2015-07-17 MED ORDER — INSULIN ASPART 100 UNIT/ML ~~LOC~~ SOLN
0.0000 [IU] | Freq: Three times a day (TID) | SUBCUTANEOUS | Status: DC
  Administered 2015-07-18: 2 [IU] via SUBCUTANEOUS

## 2015-07-17 MED ORDER — LIDOCAINE HCL (CARDIAC) 20 MG/ML IV SOLN
INTRAVENOUS | Status: DC | PRN
  Administered 2015-07-17: 60 mg via INTRAVENOUS

## 2015-07-17 SURGICAL SUPPLY — 69 items
BANDAGE ESMARK 6X9 LF (GAUZE/BANDAGES/DRESSINGS) ×1 IMPLANT
BLADE RECIP (BLADE) ×3 IMPLANT
BLADE SAW RECIP 87.9 MT (BLADE) IMPLANT
BLADE SAW SGTL 13X75X1.27 (BLADE) ×3 IMPLANT
BLADE SAW SGTL 83.5X18.5 (BLADE) ×3 IMPLANT
BNDG ELASTIC 6X10 VLCR STRL LF (GAUZE/BANDAGES/DRESSINGS) ×3 IMPLANT
BNDG ESMARK 6X9 LF (GAUZE/BANDAGES/DRESSINGS) ×3
BOWL SMART MIX CTS (DISPOSABLE) ×3 IMPLANT
BUR 6.5 DIA FLUTEX8 STRL (MISCELLANEOUS) ×1 IMPLANT
BURR 6.5 DIA FLUTEX8 STRL (MISCELLANEOUS) ×2
BURR 6.5MM DIA FLUTEX8 STRL (MISCELLANEOUS) ×1
CAPT KNEE PARTIAL DUO 2 ×3 IMPLANT
CEMENT BONE SIMPLEX SPEEDSET (Cement) ×3 IMPLANT
COVER SURGICAL LIGHT HANDLE (MISCELLANEOUS) ×6 IMPLANT
CUFF TOURNIQUET SINGLE 34IN LL (TOURNIQUET CUFF) ×3 IMPLANT
DRAPE EXTREMITY T 121X128X90 (DRAPE) ×3 IMPLANT
DRAPE IMP U-DRAPE 54X76 (DRAPES) ×3 IMPLANT
DRAPE INCISE IOBAN 66X45 STRL (DRAPES) ×9 IMPLANT
DRAPE LAPAROTOMY T 98X78 PEDS (DRAPES) IMPLANT
DRAPE PROXIMA HALF (DRAPES) ×3 IMPLANT
DRAPE U-SHAPE 47X51 STRL (DRAPES) ×3 IMPLANT
DRSG ADAPTIC 3X8 NADH LF (GAUZE/BANDAGES/DRESSINGS) ×3 IMPLANT
DRSG PAD ABDOMINAL 8X10 ST (GAUZE/BANDAGES/DRESSINGS) ×3 IMPLANT
DURAPREP 26ML APPLICATOR (WOUND CARE) ×6 IMPLANT
ELECT REM PT RETURN 9FT ADLT (ELECTROSURGICAL) ×3
ELECTRODE REM PT RTRN 9FT ADLT (ELECTROSURGICAL) ×1 IMPLANT
EVACUATOR 1/8 PVC DRAIN (DRAIN) ×3 IMPLANT
FLUID NSS /IRRIG 3000 ML XXX (IV SOLUTION) ×3 IMPLANT
GAUZE SPONGE 4X4 12PLY STRL (GAUZE/BANDAGES/DRESSINGS) ×3 IMPLANT
GLOVE BIO SURGEON STRL SZ7 (GLOVE) ×3 IMPLANT
GLOVE BIOGEL M 7.0 STRL (GLOVE) ×9 IMPLANT
GLOVE BIOGEL PI IND STRL 6.5 (GLOVE) ×1 IMPLANT
GLOVE BIOGEL PI IND STRL 7.5 (GLOVE) IMPLANT
GLOVE BIOGEL PI IND STRL 8.5 (GLOVE) ×5 IMPLANT
GLOVE BIOGEL PI INDICATOR 6.5 (GLOVE) ×2
GLOVE BIOGEL PI INDICATOR 7.5 (GLOVE)
GLOVE BIOGEL PI INDICATOR 8.5 (GLOVE) ×10
GLOVE SURG ORTHO 8.0 STRL STRW (GLOVE) ×18 IMPLANT
GOWN STRL REUS W/ TWL LRG LVL3 (GOWN DISPOSABLE) ×1 IMPLANT
GOWN STRL REUS W/ TWL XL LVL3 (GOWN DISPOSABLE) ×2 IMPLANT
GOWN STRL REUS W/TWL 2XL LVL3 (GOWN DISPOSABLE) ×3 IMPLANT
GOWN STRL REUS W/TWL LRG LVL3 (GOWN DISPOSABLE) ×2
GOWN STRL REUS W/TWL XL LVL3 (GOWN DISPOSABLE) ×4
HANDPIECE INTERPULSE COAX TIP (DISPOSABLE) ×2
HOOD PEEL AWAY FACE SHEILD DIS (HOOD) ×12 IMPLANT
KIT BASIN OR (CUSTOM PROCEDURE TRAY) ×3 IMPLANT
KIT ROOM TURNOVER OR (KITS) ×3 IMPLANT
MANIFOLD NEPTUNE II (INSTRUMENTS) ×3 IMPLANT
NEEDLE 21X1 OR PACK (NEEDLE) ×3 IMPLANT
NEEDLE HYPO 21X1 ECLIPSE (NEEDLE) ×3 IMPLANT
NS IRRIG 1000ML POUR BTL (IV SOLUTION) ×3 IMPLANT
PACK TOTAL JOINT (CUSTOM PROCEDURE TRAY) ×3 IMPLANT
PACK UNIVERSAL I (CUSTOM PROCEDURE TRAY) IMPLANT
PAD ARMBOARD 7.5X6 YLW CONV (MISCELLANEOUS) ×6 IMPLANT
PADDING CAST COTTON 6X4 STRL (CAST SUPPLIES) ×3 IMPLANT
SET HNDPC FAN SPRY TIP SCT (DISPOSABLE) ×1 IMPLANT
SET PAD KNEE POSITIONER (MISCELLANEOUS) ×3 IMPLANT
STAPLER VISISTAT 35W (STAPLE) ×3 IMPLANT
SUCTION FRAZIER HANDLE 10FR (MISCELLANEOUS) ×2
SUCTION TUBE FRAZIER 10FR DISP (MISCELLANEOUS) ×1 IMPLANT
SUT VIC AB 0 CT1 27 (SUTURE) ×4
SUT VIC AB 0 CT1 27XBRD ANBCTR (SUTURE) ×2 IMPLANT
SUT VIC AB 1 CT1 27 (SUTURE) ×2
SUT VIC AB 1 CT1 27XBRD ANBCTR (SUTURE) ×1 IMPLANT
SUT VIC AB 2-0 CT1 27 (SUTURE) ×2
SUT VIC AB 2-0 CT1 TAPERPNT 27 (SUTURE) ×1 IMPLANT
SYR 20CC LL (SYRINGE) ×6 IMPLANT
TOWEL OR 17X24 6PK STRL BLUE (TOWEL DISPOSABLE) ×3 IMPLANT
TOWEL OR 17X26 10 PK STRL BLUE (TOWEL DISPOSABLE) ×3 IMPLANT

## 2015-07-17 NOTE — Anesthesia Postprocedure Evaluation (Signed)
Anesthesia Post Note  Patient: Gabriel Gutierrez  Procedure(s) Performed: Procedure(s) (LRB): UNICOMPARTMENTAL KNEE (Left)  Patient location during evaluation: PACU Anesthesia Type: General Level of consciousness: awake Pain management: pain level controlled Respiratory status: spontaneous breathing Cardiovascular status: stable Anesthetic complications: no    Last Vitals:  Filed Vitals:   07/17/15 1320 07/17/15 1321  BP:  140/76  Pulse:  100  Temp: 36.4 C   Resp:  13    Last Pain: There were no vitals filed for this visit.               EDWARDS,Momodou Consiglio

## 2015-07-17 NOTE — Anesthesia Procedure Notes (Signed)
Anesthesia Regional Block:  Adductor canal block  Pre-Anesthetic Checklist: ,, timeout performed, Correct Patient, Correct Site, Correct Laterality, Correct Procedure, Correct Position, site marked, Risks and benefits discussed,  Surgical consent,  Pre-op evaluation,  At surgeon's request and post-op pain management  Laterality: Left  Prep: chloraprep       Needles:   Needle Type: Stimulator Needle - 80          Additional Needles:  Procedures: Doppler guided, ultrasound guided (picture in chart) and nerve stimulator Adductor canal block Narrative:  Start time: 07/17/2015 11:55 AM End time: 07/17/2015 11:15 AM Injection made incrementally with aspirations every 5 mL.  Performed by: Personally  Anesthesiologist: Finis Bud

## 2015-07-17 NOTE — Progress Notes (Signed)
Orthopedic Tech Progress Note Patient Details:  Gabriel Gutierrez 1936-02-11 [id number]  CPM Left Knee CPM Left Knee: On Left Knee Flexion (Degrees): 90 Left Knee Extension (Degrees): 0 Additional Comments: trapeze bar patient helper Viewed order from doctor's order list  Hildred Priest 07/17/2015, 2:45 PM

## 2015-07-17 NOTE — Op Note (Signed)
UNI KNEE REPLACEMENT OPERATIVE NOTE:  07/17/2015  2:34 PM  PATIENT:  Gabriel Gutierrez  80 y.o. male  PRE-OPERATIVE DIAGNOSIS:  primary osteoarthritis left knee  POST-OPERATIVE DIAGNOSIS:  primary osteoarthritis left knee  PROCEDURE:  Procedure(s): UNICOMPARTMENTAL KNEE  SURGEON:  Surgeon(s): Vickey Huger, MD  PHYSICIAN ASSISTANT: Carlynn Spry, [facility]  ANESTHESIA:   general  DRAINS: Hemovac  SPECIMEN: None  COUNTS:  Correct  TOURNIQUET:   Total Tourniquet Time Documented: Thigh (Left) - 51 minutes Total: Thigh (Left) - 51 minutes   DICTATION:  Indication for procedure:    The patient is a 80 y.o. male who has failed conservative treatment for primary osteoarthritis left knee.  Informed consent was obtained prior to anesthesia. The risks versus benefits of the operation were explain and in a way the patient can, and did, understand.   On the implant demand matching protocol, this patient scored 8.  Therefore, this patient did" "did not receive a polyethylene insert with vitamin E which is a high demand implant.  Description of procedure:     The patient was taken to the operating room and placed under anesthesia.  The patient was positioned in the usual fashion taking care that all body parts were adequately padded and/or protected.  I foley catheter was not placed.  A tourniquet was applied and the leg prepped and draped in the usual sterile fashion.  The extremity was exsanguinated with the esmarch and tourniquet inflated to 350 mmHg.  Pre-operative range of motion was normal.  The knee was in 8 degree of significant varus.  A midline incision approximately 3-4 inches long was made with a #10 blade.  A new blade was used to make a parapatellar arthrotomy going 1 cm into the quadriceps tendon, over the patella, and alongside the medial aspect of the patellar tendon.  A synovectomy was then performed with the #10 blade and forceps. I then elevated the deep MCL off the medial  tibial flare. The knee was put at 90 degrees and the patient specific cutting blocks were used to make our proximal tibial cut and distal femoral cut. The medial meniscus was removed at this point.  I then used the patient specific cutting guide on the femur to drill for lugs and cut the chamfers. Likewise, a patient specific shem and  tibial baseplate was used to prepare the tibia.  This was all done with the disposable custom instruments and implants from [facility].  I then irrigated copiously and then mixed the cement. I injected exparel in the deep soft tissues at this point. I then cemented the tibia first followed by the femur and removed excess cement and then inserted the polyethylene. I placed the leg in extension and finished injecting the rest of the exparel.  BLOOD LOSS:  300cc DRAINS: 1 hemovac, 1 pain catheter COMPLICATIONS:  None.  PLAN OF CARE: Admit to inpatient   PATIENT DISPOSITION:  PACU - hemodynamically stable.   Delay start of Pharmacological VTE agent (>24hrs) due to surgical blood loss or risk of bleeding:  not applicable  Please fax a copy of this op note to my office at [phone] (please only include page 1 and 2 of the Case Information op note)

## 2015-07-17 NOTE — H&P (Signed)
Gabriel Gutierrez MRN:  [id number] DOB/SEX:  Oct 09, 1935/male  CHIEF COMPLAINT:  Painful left Knee  HISTORY: Patient is a 80 y.o. male presented with a history of pain in the left knee. Onset of symptoms was gradual starting several years ago with gradually worsening course since that time. Prior procedures on the knee include arthroscopy. Patient has been treated conservatively with over-the-counter NSAIDs and activity modification. Patient currently rates pain in the knee at 8 out of 10 with activity. There is pain at night.  PAST MEDICAL HISTORY: Patient Active Problem List   Diagnosis Date Noted  . Renal artery stenosis ([place]) 08/31/2013  . Occlusion and stenosis of carotid artery without mention of cerebral infarction 02/22/2013  . Aftercare following surgery of the circulatory system, [place] 02/15/2013  . A-fib ([place]) 02/19/2012  . CVA (cerebral infarction) 02/18/2012  . Hyperlipidemia 02/18/2012  . Diabetes mellitus ([place]) 02/18/2012  . AAA (abdominal aortic aneurysm) ([place]) 02/11/2012  . CAD (coronary artery disease) 03/20/2011  . HTN (hypertension) 03/20/2011   Past Medical History  Diagnosis Date  . Diabetes mellitus Age 60  . Hyperlipidemia   . AAA (abdominal aortic aneurysm) ([place])   . Hypertension   . Coronary artery disease     post CABG x5 --Severe three-vessel coronary disease  . Arthritis   . Stroke [facility]) Oct. 1, 2013    Mini stroke- Afib  . Irregular heart beat  Feb 18, 2012  . Renal artery stenosis ([place])   . Atrial fibrillation ([place])   . Dysrhythmia    Past Surgical History  Procedure Laterality Date  . Pr vein bypass graft,aorto-fem-pop  12/23/2005    Infrarenal abdominal aortic aneurysm, right common iliac occlusive disease --   . Colon surgery      ruptured colon  . Median sternotomy    . Hand surgery  1997  . Cataract extraction      left eye  . Skin graft  1992  . Cardiac catheterization  08/26/2005    Est. EF of 65-70% -- Severe three-vessel coronary  artery disease -- very heavily calcified vessels and really there is no role for angioplasty  -- he heavily calcified vessels and really there is no role for angioplasty abdominal aortic aneurysm surgery  . Coronary artery bypass graft  09/02/2005    x 5 -- left internal mammary artery graft to the left anterior descending coronary artery, with a saphenous vein graft to the diagonal branch of the LAD, a sequential saphenous vein graft to the second and fourth obtuse marginal branches of the left circumflex coronary artery, and a saphenous vein graft to the posterior descending branch of RCA  -- Endoscopic vein harvesting from the left leg  . Laparotomy  1998  . Cardioversion  04/15/2012    Procedure: CARDIOVERSION;  Surgeon: Thayer Headings, MD;  Location: [place];  Service: Cardiovascular;  Laterality: N/A;  . Eye surgery Left     Cataract  . Eye surgery Right     Cataract     MEDICATIONS:   No prescriptions prior to admission    ALLERGIES:  No Known Allergies  REVIEW OF SYSTEMS:  Pertinent items noted in HPI and remainder of comprehensive ROS otherwise negative.   FAMILY HISTORY:   Family History  Problem Relation Age of Onset  . Coronary artery disease Mother   . Heart disease Mother     After age 16-Carotid  . Hyperlipidemia Mother   . Stroke Mother   . Coronary artery disease Father   .  Hyperlipidemia Father   . Hypertension Father   . Varicose Veins Father   . Heart attack Father   . Coronary artery disease Sister   . Heart disease Sister     After age 25- BP  . Heart attack Sister   . Diabetes Brother   . Heart disease Brother     After 71 yrs of age  . Heart attack Brother   . Hyperlipidemia Brother   . Alzheimer's disease Sister   . Seizures Sister     SOCIAL HISTORY:   Social History  Substance Use Topics  . Smoking status: Former Smoker -- 2.00 packs/day for 44 years    Types: Cigarettes    Quit date: 05/20/1988  . Smokeless tobacco: Former Systems developer     Quit date: 02/11/2003  . Alcohol Use: No     EXAMINATION:  Vital signs in last 24 hours:    General appearance: alert, cooperative and no distress Lungs: clear to auscultation bilaterally Heart: regular rate and rhythm, S1, S2 normal, no murmur, click, rub or gallop Abdomen: soft, non-tender; bowel sounds normal; no masses,  no organomegaly Extremities: extremities normal, atraumatic, no cyanosis or edema and Homans sign is negative, no sign of DVT Pulses: 2+ and symmetric Skin: Skin color, texture, turgor normal. No rashes or lesions Neurologic: Alert and oriented X 3, normal strength and tone. Normal symmetric reflexes. Normal coordination and gait  Musculoskeletal:  ROM 0-110, Ligaments intact,  Imaging Review Plain radiographs demonstrate severe degenerative joint disease of the left knee. The overall alignment is significant varus. The bone quality appears to be excellent for age and reported activity level.  Assessment/Plan: Primary osteoarthritis, left knee, medial  The patient history, physical examination and imaging studies are consistent with advanced degenerative joint disease of the left knee. The patient has failed conservative treatment.  The clearance notes were reviewed.  After discussion with the patient it was felt that Unicompartmental Knee Replacement was indicated. The procedure,  risks, and benefits of total knee arthroplasty were presented and reviewed. The risks including but not limited to aseptic loosening, infection, blood clots, vascular injury, stiffness, patella tracking problems complications among others were discussed. The patient acknowledged the explanation, agreed to proceed with the plan.  Stacey Maura 07/17/2015, 6:49 AM

## 2015-07-17 NOTE — Anesthesia Preprocedure Evaluation (Addendum)
Anesthesia Evaluation  Patient identified by MRN, date of birth, ID band Patient awake    Reviewed: Allergy & Precautions, NPO status , Patient's Chart, lab work & pertinent test results  Airway Mallampati: II  TM Distance: >3 FB Neck ROM: Full    Dental   Pulmonary former smoker,    breath sounds clear to auscultation       Cardiovascular hypertension, + CAD and + Peripheral Vascular Disease  + dysrhythmias  Rhythm:Regular Rate:Normal     Neuro/Psych    GI/Hepatic negative GI ROS, Neg liver ROS,   Endo/Other  diabetes  Renal/GU Renal disease     Musculoskeletal   Abdominal   Peds  Hematology   Anesthesia Other Findings   Reproductive/Obstetrics                            Anesthesia Physical Anesthesia Plan  ASA: III  Anesthesia Plan: General and Regional   Post-op Pain Management: GA combined w/ Regional for post-op pain   Induction:   Airway Management Planned: LMA  Additional Equipment:   Intra-op Plan:   Post-operative Plan: Extubation in OR  Informed Consent: I have reviewed the patients History and Physical, chart, labs and discussed the procedure including the risks, benefits and alternatives for the proposed anesthesia with the patient or authorized representative who has indicated his/her understanding and acceptance.   Dental advisory given  Plan Discussed with: CRNA, Anesthesiologist and Surgeon  Anesthesia Plan Comments:         Anesthesia Quick Evaluation

## 2015-07-17 NOTE — Evaluation (Signed)
Physical Therapy Evaluation Patient Details Name: Gabriel Gutierrez MRN: [id number] DOB: [birth year] Today's Date: 07/17/2015   History of Present Illness  80 y.o. male admitted to [facility] on 07/17/15 for elective L unicompartmental TKA.  Pt with significant PMHx of DM, AAA, HTN, CAD, stroke, A-fib, renal artery stenosis, aortofemoral bypass, ruptured colon s/p surgery, median sternotomy, and CABG x 3 vessels.  Clinical Impression  Pt is POD #0 and moving extremely well. He is at supervision level with gait around the room with RW.  HEP exercises initiated.   PT to follow acutely for deficits listed below.       Follow Up Recommendations Outpatient PT;Supervision - Intermittent    Equipment Recommendations  Rolling walker with 5" wheels;Other (comment) (vs cane, depends on how sore he is 2/28)    Recommendations for Other Services   NA    Precautions / Restrictions Precautions Precautions: Knee Precaution Booklet Issued: Yes (comment) Precaution Comments: Knee exercise handout given Restrictions Weight Bearing Restrictions: Yes LLE Weight Bearing: Weight bearing as tolerated      Mobility  Bed Mobility Overal bed mobility: Modified Independent                Transfers Overall transfer level: Needs assistance Equipment used: Rolling walker (2 wheeled) Transfers: Sit to/from Stand Sit to Stand: Min guard         General transfer comment: Min guard assist to stand with RW.  Verbal cues to stand for a minute before walking in case he became lightheaded.   Ambulation/Gait Ambulation/Gait assistance: Supervision Ambulation Distance (Feet): 10 Feet Assistive device: Rolling walker (2 wheeled) Gait Pattern/deviations: Step-to pattern;Antalgic     General Gait Details: Pt reports no instability felt at left knee.  Able to use walker safely and easily.          Balance Overall balance assessment: Needs assistance Sitting-balance support: Feet supported;No upper  extremity supported Sitting balance-Leahy Scale: Good     Standing balance support: Bilateral upper extremity supported;Single extremity supported;No upper extremity supported Standing balance-Leahy Scale: Fair                               Pertinent Vitals/Pain Pain Assessment: No/denies pain    Home Living Family/patient expects to be discharged to:: Private residence Living Arrangements: Spouse/significant other                             Extremity/Trunk Assessment   Upper Extremity Assessment: Overall WFL for tasks assessed           Lower Extremity Assessment: RLE deficits/detail RLE Deficits / Details: right leg with normal post op weakness, no pain.  Pt with at least 3/5 ankle, 3-/5 knee, 3/5 hip flexion    Cervical / Trunk Assessment: Normal  Communication   Communication: No difficulties  Cognition Arousal/Alertness: Awake/alert Behavior During Therapy: WFL for tasks assessed/performed Overall Cognitive Status: Within Functional Limits for tasks assessed                      General Comments      Exercises Total Joint Exercises Ankle Circles/Pumps: AROM;Both;10 reps Quad Sets: AROM;Left;10 reps Towel Squeeze: AROM;Both;10 reps Heel Slides: AROM;Left;10 reps Straight Leg Raises: AROM;Left;10 reps      Assessment/Plan    PT Assessment Patient needs continued PT services  PT Diagnosis Difficulty walking;Abnormality of gait;Generalized weakness;Acute pain  PT Problem List Decreased strength;Decreased range of motion;Decreased activity tolerance;Decreased balance;Decreased mobility;Decreased knowledge of use of DME;Decreased knowledge of precautions;Pain  PT Treatment Interventions DME instruction;Gait training;Stair training;Functional mobility training;Therapeutic activities;Therapeutic exercise;Balance training;Neuromuscular re-education;Patient/family education;Manual techniques;Modalities   PT Goals (Current goals can  be found in the Care Plan section) Acute Rehab PT Goals Patient Stated Goal: to get back to activity PT Goal Formulation: With patient Time For Goal Achievement: 07/24/15 Potential to Achieve Goals: Good    Frequency 7X/week           End of Session   Activity Tolerance: Patient tolerated treatment well Patient left: in chair;with call bell/phone within reach;with family/visitor present Nurse Communication: Mobility status    Functional Assessment Tool Used: assist level Functional Limitation: Mobility: Walking and moving around Mobility: Walking and Moving Around Current Status [id number]): At least 1 percent but less than 20 percent impaired, limited or restricted Mobility: Walking and Moving Around Goal Status [id number]): 0 percent impaired, limited or restricted    Time: [id number] PT Time Calculation (min) (ACUTE ONLY): 43 min   Charges:   PT Evaluation $PT Eval Moderate Complexity: 1 Procedure PT Treatments $Therapeutic Exercise: 8-22 mins   PT G Codes:   PT G-Codes **NOT FOR INPATIENT CLASS** Functional Assessment Tool Used: assist level Functional Limitation: Mobility: Walking and moving around Mobility: Walking and Moving Around Current Status [id number]): At least 1 percent but less than 20 percent impaired, limited or restricted Mobility: Walking and Moving Around Goal Status [phone]): 0 percent impaired, limited or restricted    Wilferd Ritson B. Cobb, [place], DPT [phone]   07/17/2015, 5:07 PM

## 2015-07-17 NOTE — Transfer of Care (Signed)
Immediate Anesthesia Transfer of Care Note  Patient: Gabriel Gutierrez  Procedure(s) Performed: Procedure(s): UNICOMPARTMENTAL KNEE (Left)  Patient Location: PACU  Anesthesia Type:GA combined with regional for post-op pain  Level of Consciousness: awake, alert , oriented and patient cooperative  Airway & Oxygen Therapy: Patient Spontanous Breathing and Patient connected to nasal cannula oxygen  Post-op Assessment: Report given to RN and Post -op Vital signs reviewed and stable  Post vital signs: Reviewed and stable  Last Vitals:  Filed Vitals:   07/17/15 1121 07/17/15 1122  BP:    Pulse: 73 76  Temp:    Resp: 9 12    Complications: No apparent anesthesia complications

## 2015-07-18 ENCOUNTER — Other Ambulatory Visit: Payer: Self-pay | Admitting: Neurology

## 2015-07-18 ENCOUNTER — Encounter (HOSPITAL_COMMUNITY): Payer: Self-pay | Admitting: Orthopedic Surgery

## 2015-07-18 DIAGNOSIS — M1712 Unilateral primary osteoarthritis, left knee: Secondary | ICD-10-CM | POA: Diagnosis not present

## 2015-07-18 DIAGNOSIS — I10 Essential (primary) hypertension: Secondary | ICD-10-CM | POA: Diagnosis not present

## 2015-07-18 DIAGNOSIS — I4891 Unspecified atrial fibrillation: Secondary | ICD-10-CM | POA: Diagnosis not present

## 2015-07-18 DIAGNOSIS — E785 Hyperlipidemia, unspecified: Secondary | ICD-10-CM | POA: Diagnosis not present

## 2015-07-18 DIAGNOSIS — E1151 Type 2 diabetes mellitus with diabetic peripheral angiopathy without gangrene: Secondary | ICD-10-CM | POA: Diagnosis not present

## 2015-07-18 DIAGNOSIS — D62 Acute posthemorrhagic anemia: Secondary | ICD-10-CM | POA: Diagnosis not present

## 2015-07-18 LAB — HEMOGLOBIN A1C
HEMOGLOBIN A1C: 6.3 % — AB (ref 4.8–5.6)
MEAN PLASMA GLUCOSE: 134 mg/dL

## 2015-07-18 LAB — BASIC METABOLIC PANEL
ANION GAP: 9 (ref 5–15)
BUN: 21 mg/dL — ABNORMAL HIGH (ref 6–20)
CALCIUM: 8.4 mg/dL — AB (ref 8.9–10.3)
CO2: 25 mmol/L (ref 22–32)
CREATININE: 1.35 mg/dL — AB (ref 0.61–1.24)
Chloride: 106 mmol/L (ref 101–111)
GFR, EST AFRICAN AMERICAN: 57 mL/min — AB (ref >60)
GFR, EST NON AFRICAN AMERICAN: 49 mL/min — AB (ref >60)
Glucose, Bld: 125 mg/dL — ABNORMAL HIGH (ref 65–99)
Potassium: 4.4 mmol/L (ref 3.5–5.1)
SODIUM: 140 mmol/L (ref 135–145)

## 2015-07-18 LAB — CBC
HEMATOCRIT: 37.8 % — AB (ref 39.0–52.0)
Hemoglobin: 12.3 g/dL — ABNORMAL LOW (ref 13.0–17.0)
MCH: 28.9 pg (ref 26.0–34.0)
MCHC: 32.5 g/dL (ref 30.0–36.0)
MCV: 88.7 fL (ref 78.0–100.0)
Platelets: 236 10*3/uL (ref 150–400)
RBC: 4.26 MIL/uL (ref 4.22–5.81)
RDW: 13.5 % (ref 11.5–15.5)
WBC: 15.3 10*3/uL — AB (ref 4.0–10.5)

## 2015-07-18 LAB — GLUCOSE, CAPILLARY: GLUCOSE-CAPILLARY: 142 mg/dL — AB (ref 65–99)

## 2015-07-18 MED ORDER — OXYCODONE HCL ER 10 MG PO T12A: 10.0000 mg | EXTENDED_RELEASE_TABLET | Freq: Two times a day (BID) | ORAL | Status: DC

## 2015-07-18 MED ORDER — CELECOXIB 200 MG PO CAPS: 200.0000 mg | ORAL_CAPSULE | Freq: Every day | ORAL | Status: DC

## 2015-07-18 MED ORDER — OXYCODONE HCL 5 MG PO TABS: 5.0000 mg | ORAL_TABLET | ORAL | Status: DC | PRN

## 2015-07-18 MED ORDER — METHOCARBAMOL 500 MG PO TABS: 500.0000 mg | ORAL_TABLET | Freq: Four times a day (QID) | ORAL | Status: DC | PRN

## 2015-07-18 MED ORDER — METOCLOPRAMIDE HCL 5 MG PO TABS: 5.0000 mg | ORAL_TABLET | Freq: Three times a day (TID) | ORAL | Status: DC | PRN

## 2015-07-18 NOTE — Progress Notes (Signed)
Patient is being discharged per Dr. Montine Circle.  Reviewed discharge paperwork/perscriptions with patient and patient's wife. Patient able to walk with PT with a cane without difficulty. Patient has a walker and bedside commode at home. Removed patient's IV.  Gabriel Gutierrez 07/18/2015 11:22 AM

## 2015-07-18 NOTE — Care Management Note (Signed)
Case Management Note  Patient Details  Name: BEAR GALA MRN: [id number] Date of Birth: 1935/12/22  Subjective/Objective:       S/p unicompartmental knee replacement             Action/Plan: Set up with [facility] for HHPT by MD office. Spoke with patient, no change in discharge plan. Patient stated that [employer] has delivered CPM, rolling walker and 3N1 to his home. Patient stated that his wife will be able to assist him after discharge.    Expected Discharge Date:                  Expected Discharge Plan:  [place]  In-House Referral:  NA  Discharge planning Services  CM Consult  Post Acute Care Choice:  Durable Medical Equipment, Home Health Choice offered to:  Patient  DME Arranged:  3-N-1, CPM, Walker rolling DME Agency:  [employer]  HH Arranged:  PT HH Agency:  [place]  Status of Service:  Completed, signed off  Medicare Important Message Given:    Date Medicare IM Given:    Medicare IM give by:    Date Additional Medicare IM Given:    Additional Medicare Important Message give by:     If discussed at [place] of Stay Meetings, dates discussed:    Additional Comments:  Nila Nephew, RN 07/18/2015, 8:24 AM

## 2015-07-18 NOTE — Progress Notes (Signed)
Occupational Therapy Evaluation/Discharge Patient Details Name: Gabriel Gutierrez MRN: [id number] DOB: Nov 04, 1935 Today's Date: 07/18/2015    History of Present Illness 80 y.o. male admitted to [facility] on 07/17/15 for elective L unicompartmental TKA.  Pt with significant PMHx of DM, AAA, HTN, CAD, stroke, A-fib, renal artery stenosis, aortofemoral bypass, ruptured colon s/p surgery, median sternotomy, and CABG x 3 vessels.   Clinical Impression   PTA, pt was independent with ADLs and mobility. Pt currently requires supervision for ADLs and mobility due to restless and impulsivity. Emphasized education on fall prevention, home safety and gradually increasing activity level despite having little to no pain due to pt's hx of falls. Also educated pt on compensatory strategies for ADLs, pain management and energy conservation strategies. Pt plans to d/c home with 24/7 assistance from his wife. All education has been completed and pt has no further questions. Pt is adequate for discharge from occupational therapy standpoint. OT signing off.    Follow Up Recommendations  No OT follow up;Supervision - Intermittent    Equipment Recommendations  None recommended by OT (3in1, RW and CPM already set up)    Recommendations for Other Services       Precautions / Restrictions Precautions Precautions: Knee Precaution Booklet Issued: No Precaution Comments: Reviewed not placing pillow, ice pack or other object underneath knee Restrictions Weight Bearing Restrictions: Yes LLE Weight Bearing: Weight bearing as tolerated      Mobility Bed Mobility Overal bed mobility: Modified Independent             General bed mobility comments: HOB flat, no use of bedrails to simulate home environment  Transfers Overall transfer level: Needs assistance Equipment used: Rolling walker (2 wheeled) Transfers: Sit to/from Stand Sit to Stand: Supervision         General transfer comment: Supervision only for  safety because of pt's impulsivity. Pt stood x3 without using RW or pushing up from seated surface with no difficulty or LOB, but emphasized importance of using RW to reduce risk of falling since pt has hx of falls.    Balance Overall balance assessment: Needs assistance Sitting-balance support: No upper extremity supported;Feet supported Sitting balance-Leahy Scale: Normal     Standing balance support: No upper extremity supported;During functional activity Standing balance-Leahy Scale: Good Standing balance comment: Able to stand and maintain balance without UE support                            ADL Overall ADL's : Needs assistance/impaired     Grooming: Wash/dry hands;Supervision/safety;Standing           Upper Body Dressing : Supervision/safety;Sitting   Lower Body Dressing: Supervision/safety;Sit to/from stand;Cueing for compensatory techniques Lower Body Dressing Details (indicate cue type and reason): Cues to dress RLE first and undress it last Toilet Transfer: Supervision/safety;Ambulation;BSC;RW Toilet Transfer Details (indicate cue type and reason): BSC over toilet, cues to feel BSC on back of legs before reaching back to sit down Toileting- Clothing Manipulation and Hygiene: Supervision/safety;Sit to/from stand   Tub/ Shower Transfer: Walk-in shower;Supervision/safety;Cueing for sequencing;Ambulation;Rolling walker Tub/Shower Transfer Details (indicate cue type and reason): Cues for proper step sequence with RW Functional mobility during ADLs: Supervision/safety;Rolling walker General ADL Comments: Max verbal cues for safety during ADLs and mobility. Emphasized education on gradually increasing activity level despite having little to no pain due to pt's restless and slightly impulsive behavior during session. Educated pt on pain/edema management, energy conservation and fall  prevention strategies.     Vision Vision Assessment?: No apparent visual deficits    Perception     Praxis      Pertinent Vitals/Pain Pain Assessment: No/denies pain     Hand Dominance Right   Extremity/Trunk Assessment Upper Extremity Assessment Upper Extremity Assessment: Overall WFL for tasks assessed   Lower Extremity Assessment Lower Extremity Assessment: RLE deficits/detail RLE Deficits / Details: decreased ROM and strength as expected post op   Cervical / Trunk Assessment Cervical / Trunk Assessment: Normal   Communication Communication Communication: No difficulties   Cognition Arousal/Alertness: Awake/alert Behavior During Therapy: WFL for tasks assessed/performed;Restless Overall Cognitive Status: Within Functional Limits for tasks assessed                     General Comments       Exercises       Shoulder Instructions      Home Living Family/patient expects to be discharged to:: Private residence Living Arrangements: Spouse/significant other Available Help at Discharge: Family;Available 24 hours/day Type of Home: House Home Access: Stairs to enter [employer] of Steps: 2 Entrance Stairs-Rails: None Home Layout: Two level;Able to live on main level with bedroom/bathroom Alternate Level Stairs-Number of Steps: flight   Bathroom Shower/Tub: Walk-in shower;Curtain   Bathroom Toilet: Handicapped height     Home Equipment: Shower seat;Shower seat - built in;Hand held shower head          Prior Functioning/Environment Level of Independence: Independent        Comments: 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 Pt reports he has had several falls in the last few months (mainly due to tripping).    OT Diagnosis: Acute pain   OT Problem List: Decreased strength;Decreased range of motion;Impaired balance (sitting and/or standing);Decreased activity tolerance;Decreased coordination;Decreased safety awareness;Decreased knowledge of use of DME or AE;Decreased knowledge of precautions;Pain   OT  Treatment/Interventions:      OT Goals(Current goals can be found in the care plan section) Acute Rehab OT Goals Patient Stated Goal: to get back to walking and farming OT Goal Formulation: With patient Time For Goal Achievement: 08/01/15 Potential to Achieve Goals: Good  OT Frequency:     Barriers to D/C:            Co-evaluation              End of Session CPM Left Knee CPM Left Knee: Off Additional Comments: 0 bone foam applied  Activity Tolerance:   Patient left:     Time: [id number] OT Time Calculation (min): 34 min Charges:  OT General Charges $OT Visit: 1 Procedure OT Evaluation $OT Eval Moderate Complexity: 1 Procedure OT Treatments $Self Care/Home Management : 8-22 mins G-Codes: OT G-codes **NOT FOR INPATIENT CLASS** Functional Assessment Tool Used: clinical judgement Functional Limitation: Self care Self Care Current Status [id number]): At least 1 percent but less than 20 percent impaired, limited or restricted Self Care Goal Status [id number]): At least 1 percent but less than 20 percent impaired, limited or restricted Self Care Discharge Status [phone]): At least 1 percent but less than 20 percent impaired, limited or restricted  Redmond Baseman, OTR/L Pager: [phone] 07/18/2015, 10:34 AM

## 2015-07-18 NOTE — Progress Notes (Signed)
SPORTS MEDICINE AND JOINT REPLACEMENT  Lara Mulch, MD   Carlynn Spry, PA-C [place], [place], [place]  [id number]                             [phone]   PROGRESS NOTE  Subjective:  negative for Chest Pain  negative for Shortness of Breath  negative for Nausea/Vomiting   negative for Calf Pain  negative for Bowel Movement   Tolerating Diet: yes         Patient reports pain as 6 on 0-10 scale.    Objective: Vital signs in last 24 hours:   Patient Vitals for the past 24 hrs:  BP Temp Temp src Pulse Resp SpO2 Height Weight  07/18/15 0130 132/79 mmHg 98 F (36.7 C) Oral 78 16 98 % - -  07/17/15 2200 (!) 126/58 mmHg 98 F (36.7 C) - 86 16 99 % - -  07/17/15 1526 (!) 146/71 mmHg 98.6 F (37 C) - 80 16 99 % - -  07/17/15 1451 (!) 142/71 mmHg - - 75 16 99 % - -  07/17/15 1450 (!) 142/51 mmHg - - - - - - -  07/17/15 1351 132/82 mmHg - - 90 15 98 % - -  07/17/15 1336 140/76 mmHg - - (!) 102 15 98 % - -  07/17/15 1321 140/76 mmHg - - 100 13 97 % - -  07/17/15 1320 - 97.6 F (36.4 C) - - - - - -  07/17/15 1122 - - - 76 12 100 % - -  07/17/15 1121 - - - 73 (!) 9 100 % - -  07/17/15 1120 - - - 74 10 99 % - -  07/17/15 1119 (!) 104/56 mmHg - - 73 12 100 % - -  07/17/15 1118 - - - 67 15 100 % - -  07/17/15 1117 - - - 64 16 100 % - -  07/17/15 1116 - - - (!) 59 11 100 % - -  07/17/15 1115 - - - (!) 57 16 100 % - -  07/17/15 1114 133/64 mmHg - - 63 12 100 % - -  07/17/15 1113 - - - 69 12 100 % - -  07/17/15 1112 - - - (!) 58 (!) 0 99 % - -  07/17/15 1111 - - - 69 12 100 % - -  07/17/15 1110 - - - 64 18 100 % - -  07/17/15 1109 - - - 70 16 100 % - -  07/17/15 1108 (!) 144/79 mmHg - - 76 (!) 21 100 % - -  07/17/15 1107 - - - 63 14 100 % - -  07/17/15 1106 - - - 63 15 100 % - -  07/17/15 1105 - - - 66 16 100 % - -  07/17/15 1104 - - - 62 16 100 % - -  07/17/15 1103 - - - 65 13 100 % - -  07/17/15 1102 - - - 69 15 100 % - -  07/17/15 1101 - - - 64 19 96 % - -   07/17/15 1100 - - - 72 14 99 % - -  07/17/15 1059 - - - 71 - 100 % - -  07/17/15 1058 - - - 72 - 99 % - -  07/17/15 1057 - - - 65 - 99 % - -  07/17/15 0938 (!) 148/66 mmHg (!) 96.8 F (36 C)  Oral 79 18 98 % 5\' 6"  (1.676 m) 75.751 kg (167 lb)    @flow {1959:LAST@   Intake/Output from previous day:   02/27 0701 - 02/28 0700 In: 1540 [P.O.:240; I.V.:1300] Out: 820 [Urine:750]   Intake/Output this shift:       Intake/Output      02/27 0701 - 02/28 0700 02/28 0701 - 03/01 0700   P.O. 240    I.V. (mL/kg) 1300 (17.2)    Total Intake(mL/kg) 1540 (20.3)    Urine (mL/kg/hr) 750    Blood 70    Total Output 820     Net +720             LABORATORY DATA:  Recent Labs  07/17/15 1738  WBC 10.6*  HGB 14.2  HCT 42.8  PLT 237    Recent Labs  07/17/15 1738  CREATININE 1.21   Lab Results  Component Value Date   INR 1.10 07/17/2015   INR 2.04* 07/07/2015   INR 1.05 02/18/2012    Examination:  General appearance: alert, cooperative and no distress Extremities: extremities normal, atraumatic, no cyanosis or edema  Wound Exam: clean, dry, intact or draining   Drainage:  Scant/small amount Bloody exudate  Motor Exam: Quadriceps and Hamstrings Intact  Sensory Exam: Deep Peroneal and Tibial normal   Assessment:    1 Day Post-Op  Procedure(s) (LRB): UNICOMPARTMENTAL KNEE (Left)  ADDITIONAL DIAGNOSIS:  Active Problems:   S/P left unicompartmental knee replacement  Acute Blood Loss Anemia   Plan: Physical Therapy as ordered Weight Bearing as Tolerated (WBAT)  DVT Prophylaxis:  Lovenox  DISCHARGE PLAN: Home  DISCHARGE NEEDS: PT         Donia Ast 07/18/2015, 7:11 AM

## 2015-07-18 NOTE — Discharge Instructions (Signed)

## 2015-07-18 NOTE — Care Management Obs Status (Signed)
[place] NOTIFICATION   Patient Details  Name: Gabriel Gutierrez MRN: [id number] Date of Birth: [birth year]   Medicare Observation Status Notification Given:  Yes    Nila Nephew, RN 07/18/2015, 8:22 AM

## 2015-07-18 NOTE — Progress Notes (Signed)
Physical Therapy Treatment Patient Details Name: Gabriel Gutierrez MRN: [id number] DOB: August 10, 1935 Today's Date: 07/18/2015    History of Present Illness 80 y.o. male admitted to [facility] on 07/17/15 for elective L unicompartmental TKA.  Pt with significant PMHx of DM, AAA, HTN, CAD, stroke, A-fib, renal artery stenosis, aortofemoral bypass, ruptured colon s/p surgery, median sternotomy, and CABG x 3 vessels.    PT Comments    Pt is POD #1 and was able to transition to using a cane for gait. He is impulsive and wife reports she thinks the pain medication has him "amped up".  Verbal cues for pt safety and to slow down speed of movement.  All education completed and pt planning to d/c home this AM.   Follow Up Recommendations  Outpatient PT;Supervision - Intermittent     Equipment Recommendations  None recommended by PT (pt has cane at home)    Recommendations for Other Services   NA     Precautions / Restrictions Precautions Precautions: Knee Precaution Booklet Issued: Yes (comment) Precaution Comments: Reviewed knee precautions and went through entire knee exercise hanout.  Restrictions Weight Bearing Restrictions: Yes LLE Weight Bearing: Weight bearing as tolerated    Mobility  Bed Mobility Overal bed mobility: Modified Independent             General bed mobility comments: Pt OOB in chair  Transfers Overall transfer level: Needs assistance Equipment used: Rolling walker (2 wheeled);Straight cane Transfers: Sit to/from Stand Sit to Stand: Supervision         General transfer comment: supervision for safety due to fast speed of transitions.   Ambulation/Gait Ambulation/Gait assistance: Supervision;Min guard Ambulation Distance (Feet): 120 Feet Assistive device: Rolling walker (2 wheeled);Straight cane Gait Pattern/deviations: Step-through pattern;Antalgic Gait velocity: too fast to be safe, verbal cues to slow down. Gait velocity interpretation: at or above normal  speed for age/gender General Gait Details: Very mildly antalgic gait pattern, started with RW, transitioned to cane.  Up to min guard assist due to pt's impulsivity and fast movement.  He needs cues for both devices for safety.     Stairs Stairs: Yes Stairs assistance: Supervision Stair Management: No rails;Step to pattern;Forwards;With cane Number of Stairs: 2 General stair comments: Pt able to demonstrate safe use of cane to ascend and descend stairs to get in the house.  Pt reports he doesn't need to go upstairs at home because it is storage.          Balance Overall balance assessment: Needs assistance Sitting-balance support: Feet supported;No upper extremity supported Sitting balance-Leahy Scale: Normal     Standing balance support: Bilateral upper extremity supported;Single extremity supported;No upper extremity supported Standing balance-Leahy Scale: Good Standing balance comment: Able to stand and maintain balance without UE support                    Cognition Arousal/Alertness: Awake/alert Behavior During Therapy: Impulsive Overall Cognitive Status: Within Functional Limits for tasks assessed                      Exercises Total Joint Exercises Ankle Circles/Pumps: AROM;Both;10 reps Quad Sets: AROM;Left;10 reps Towel Squeeze: AROM;Both;10 reps Short Arc Quad: AROM;Left;10 reps Heel Slides: AROM;Left;10 reps Hip ABduction/ADduction: AROM;Left;10 reps Straight Leg Raises: AROM;Left;10 reps Long Arc Quad: AROM;Left;10 reps Knee Flexion: AROM;Left;10 reps Goniometric ROM: 3-100        Pertinent Vitals/Pain Pain Assessment: No/denies pain    Home Living Family/patient expects to be discharged  to:: Private residence Living Arrangements: Spouse/significant other Available Help at Discharge: Family;Available 24 hours/day Type of Home: House Home Access: Stairs to enter Entrance Stairs-Rails: None Home Layout: Two level;Able to live on main  level with bedroom/bathroom Home Equipment: Shower seat;Shower seat - built in;Hand held shower head      Prior Function Level of Independence: Independent      Comments: 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 Pt reports he has had several falls in the last few months (mainly due to tripping).   PT Goals (current goals can now be found in the care plan section) Acute Rehab PT Goals Patient Stated Goal: to get back to walking and farming Progress towards PT goals: Progressing toward goals    Frequency  7X/week    PT Plan Current plan remains appropriate       End of Session   Activity Tolerance: Patient tolerated treatment well Patient left: in chair;with call bell/phone within reach;with family/visitor present     Time: 1031-1059 PT Time Calculation (min) (ACUTE ONLY): 28 min  Charges:  $Gait Training: 8-22 mins $Therapeutic Exercise: 8-22 mins                    G Codes:  Functional Assessment Tool Used: assist level Functional Limitation: Mobility: Walking and moving around Mobility: Walking and Moving Around Current Status [phone]): At least 1 percent but less than 20 percent impaired, limited or restricted Mobility: Walking and Moving Around Goal Status [phone]): 0 percent impaired, limited or restricted Mobility: Walking and Moving Around Discharge Status [phone]): At least 1 percent but less than 20 percent impaired, limited or restricted   Ariyana Faw B. [place], [place], DPT [phone]   07/18/2015, 11:08 AM

## 2015-07-19 ENCOUNTER — Other Ambulatory Visit: Payer: Self-pay

## 2015-07-19 DIAGNOSIS — E119 Type 2 diabetes mellitus without complications: Secondary | ICD-10-CM | POA: Diagnosis not present

## 2015-07-19 DIAGNOSIS — I4891 Unspecified atrial fibrillation: Secondary | ICD-10-CM | POA: Diagnosis not present

## 2015-07-19 DIAGNOSIS — Z471 Aftercare following joint replacement surgery: Secondary | ICD-10-CM | POA: Diagnosis not present

## 2015-07-19 DIAGNOSIS — I2510 Atherosclerotic heart disease of native coronary artery without angina pectoris: Secondary | ICD-10-CM | POA: Diagnosis not present

## 2015-07-19 DIAGNOSIS — I10 Essential (primary) hypertension: Secondary | ICD-10-CM | POA: Diagnosis not present

## 2015-07-19 DIAGNOSIS — I714 Abdominal aortic aneurysm, without rupture: Secondary | ICD-10-CM | POA: Diagnosis not present

## 2015-07-19 DIAGNOSIS — G3184 Mild cognitive impairment, so stated: Secondary | ICD-10-CM

## 2015-07-19 MED ORDER — CEREFOLIN 6-1-50-5 MG PO TABS: 1.0000 | ORAL_TABLET | ORAL | Status: DC

## 2015-07-20 DIAGNOSIS — E119 Type 2 diabetes mellitus without complications: Secondary | ICD-10-CM | POA: Diagnosis not present

## 2015-07-20 DIAGNOSIS — I2510 Atherosclerotic heart disease of native coronary artery without angina pectoris: Secondary | ICD-10-CM | POA: Diagnosis not present

## 2015-07-20 DIAGNOSIS — I714 Abdominal aortic aneurysm, without rupture: Secondary | ICD-10-CM | POA: Diagnosis not present

## 2015-07-20 DIAGNOSIS — I4891 Unspecified atrial fibrillation: Secondary | ICD-10-CM | POA: Diagnosis not present

## 2015-07-20 DIAGNOSIS — Z471 Aftercare following joint replacement surgery: Secondary | ICD-10-CM | POA: Diagnosis not present

## 2015-07-20 DIAGNOSIS — I10 Essential (primary) hypertension: Secondary | ICD-10-CM | POA: Diagnosis not present

## 2015-07-21 DIAGNOSIS — Z471 Aftercare following joint replacement surgery: Secondary | ICD-10-CM | POA: Diagnosis not present

## 2015-07-21 DIAGNOSIS — I10 Essential (primary) hypertension: Secondary | ICD-10-CM | POA: Diagnosis not present

## 2015-07-21 DIAGNOSIS — I2510 Atherosclerotic heart disease of native coronary artery without angina pectoris: Secondary | ICD-10-CM | POA: Diagnosis not present

## 2015-07-21 DIAGNOSIS — E119 Type 2 diabetes mellitus without complications: Secondary | ICD-10-CM | POA: Diagnosis not present

## 2015-07-21 DIAGNOSIS — I4891 Unspecified atrial fibrillation: Secondary | ICD-10-CM | POA: Diagnosis not present

## 2015-07-21 DIAGNOSIS — I714 Abdominal aortic aneurysm, without rupture: Secondary | ICD-10-CM | POA: Diagnosis not present

## 2015-07-22 DIAGNOSIS — Z471 Aftercare following joint replacement surgery: Secondary | ICD-10-CM | POA: Diagnosis not present

## 2015-07-22 DIAGNOSIS — I10 Essential (primary) hypertension: Secondary | ICD-10-CM | POA: Diagnosis not present

## 2015-07-22 DIAGNOSIS — E119 Type 2 diabetes mellitus without complications: Secondary | ICD-10-CM | POA: Diagnosis not present

## 2015-07-22 DIAGNOSIS — I2510 Atherosclerotic heart disease of native coronary artery without angina pectoris: Secondary | ICD-10-CM | POA: Diagnosis not present

## 2015-07-22 DIAGNOSIS — I4891 Unspecified atrial fibrillation: Secondary | ICD-10-CM | POA: Diagnosis not present

## 2015-07-22 DIAGNOSIS — I714 Abdominal aortic aneurysm, without rupture: Secondary | ICD-10-CM | POA: Diagnosis not present

## 2015-07-24 DIAGNOSIS — I714 Abdominal aortic aneurysm, without rupture: Secondary | ICD-10-CM | POA: Diagnosis not present

## 2015-07-24 DIAGNOSIS — I10 Essential (primary) hypertension: Secondary | ICD-10-CM | POA: Diagnosis not present

## 2015-07-24 DIAGNOSIS — Z471 Aftercare following joint replacement surgery: Secondary | ICD-10-CM | POA: Diagnosis not present

## 2015-07-24 DIAGNOSIS — I4891 Unspecified atrial fibrillation: Secondary | ICD-10-CM | POA: Diagnosis not present

## 2015-07-24 DIAGNOSIS — E119 Type 2 diabetes mellitus without complications: Secondary | ICD-10-CM | POA: Diagnosis not present

## 2015-07-24 DIAGNOSIS — I2510 Atherosclerotic heart disease of native coronary artery without angina pectoris: Secondary | ICD-10-CM | POA: Diagnosis not present

## 2015-07-25 DIAGNOSIS — Z471 Aftercare following joint replacement surgery: Secondary | ICD-10-CM | POA: Diagnosis not present

## 2015-07-25 DIAGNOSIS — Z96652 Presence of left artificial knee joint: Secondary | ICD-10-CM | POA: Diagnosis not present

## 2015-07-26 DIAGNOSIS — M25562 Pain in left knee: Secondary | ICD-10-CM | POA: Diagnosis not present

## 2015-07-26 DIAGNOSIS — R262 Difficulty in walking, not elsewhere classified: Secondary | ICD-10-CM | POA: Diagnosis not present

## 2015-07-26 DIAGNOSIS — M25662 Stiffness of left knee, not elsewhere classified: Secondary | ICD-10-CM | POA: Diagnosis not present

## 2015-07-27 DIAGNOSIS — E1129 Type 2 diabetes mellitus with other diabetic kidney complication: Secondary | ICD-10-CM | POA: Diagnosis not present

## 2015-07-27 DIAGNOSIS — E1149 Type 2 diabetes mellitus with other diabetic neurological complication: Secondary | ICD-10-CM | POA: Diagnosis not present

## 2015-07-27 DIAGNOSIS — E1142 Type 2 diabetes mellitus with diabetic polyneuropathy: Secondary | ICD-10-CM | POA: Diagnosis not present

## 2015-07-27 DIAGNOSIS — E785 Hyperlipidemia, unspecified: Secondary | ICD-10-CM | POA: Diagnosis not present

## 2015-07-27 DIAGNOSIS — I10 Essential (primary) hypertension: Secondary | ICD-10-CM | POA: Diagnosis not present

## 2015-07-27 DIAGNOSIS — I4891 Unspecified atrial fibrillation: Secondary | ICD-10-CM | POA: Diagnosis not present

## 2015-07-27 DIAGNOSIS — N183 Chronic kidney disease, stage 3 (moderate): Secondary | ICD-10-CM | POA: Diagnosis not present

## 2015-07-28 DIAGNOSIS — M25562 Pain in left knee: Secondary | ICD-10-CM | POA: Diagnosis not present

## 2015-07-28 DIAGNOSIS — R262 Difficulty in walking, not elsewhere classified: Secondary | ICD-10-CM | POA: Diagnosis not present

## 2015-07-28 DIAGNOSIS — M25662 Stiffness of left knee, not elsewhere classified: Secondary | ICD-10-CM | POA: Diagnosis not present

## 2015-07-31 DIAGNOSIS — R262 Difficulty in walking, not elsewhere classified: Secondary | ICD-10-CM | POA: Diagnosis not present

## 2015-07-31 DIAGNOSIS — M25662 Stiffness of left knee, not elsewhere classified: Secondary | ICD-10-CM | POA: Diagnosis not present

## 2015-07-31 DIAGNOSIS — M25562 Pain in left knee: Secondary | ICD-10-CM | POA: Diagnosis not present

## 2015-08-02 ENCOUNTER — Encounter: Payer: Self-pay | Admitting: Family

## 2015-08-03 DIAGNOSIS — R262 Difficulty in walking, not elsewhere classified: Secondary | ICD-10-CM | POA: Diagnosis not present

## 2015-08-03 DIAGNOSIS — M25662 Stiffness of left knee, not elsewhere classified: Secondary | ICD-10-CM | POA: Diagnosis not present

## 2015-08-03 DIAGNOSIS — M25562 Pain in left knee: Secondary | ICD-10-CM | POA: Diagnosis not present

## 2015-08-07 DIAGNOSIS — R262 Difficulty in walking, not elsewhere classified: Secondary | ICD-10-CM | POA: Diagnosis not present

## 2015-08-07 DIAGNOSIS — M25562 Pain in left knee: Secondary | ICD-10-CM | POA: Diagnosis not present

## 2015-08-07 DIAGNOSIS — M25662 Stiffness of left knee, not elsewhere classified: Secondary | ICD-10-CM | POA: Diagnosis not present

## 2015-08-08 ENCOUNTER — Encounter: Payer: Self-pay | Admitting: Family

## 2015-08-08 ENCOUNTER — Ambulatory Visit (HOSPITAL_COMMUNITY)
Admission: RE | Admit: 2015-08-08 | Discharge: 2015-08-08 | Disposition: A | Discharge status: Home / Self Care | Payer: Medicare Other | Source: Ambulatory Visit | Attending: Family | Admitting: Family

## 2015-08-08 ENCOUNTER — Ambulatory Visit (INDEPENDENT_AMBULATORY_CARE_PROVIDER_SITE_OTHER): Payer: Medicare Other | Admitting: Family

## 2015-08-08 ENCOUNTER — Ambulatory Visit (INDEPENDENT_AMBULATORY_CARE_PROVIDER_SITE_OTHER)
Admission: RE | Admit: 2015-08-08 | Discharge: 2015-08-08 | Disposition: A | Discharge status: Home / Self Care | Payer: Medicare Other | Source: Ambulatory Visit | Attending: Family | Admitting: Family

## 2015-08-08 VITALS — BP 153/76 | HR 80 | Temp 100.0°F | Resp 16 | Ht 66.0 in | Wt 170.0 lb

## 2015-08-08 DIAGNOSIS — I714 Abdominal aortic aneurysm, without rupture, unspecified: Secondary | ICD-10-CM

## 2015-08-08 DIAGNOSIS — Z48812 Encounter for surgical aftercare following surgery on the circulatory system: Secondary | ICD-10-CM | POA: Diagnosis not present

## 2015-08-08 DIAGNOSIS — Z95828 Presence of other vascular implants and grafts: Secondary | ICD-10-CM

## 2015-08-08 DIAGNOSIS — I6529 Occlusion and stenosis of unspecified carotid artery: Secondary | ICD-10-CM | POA: Diagnosis not present

## 2015-08-08 DIAGNOSIS — I701 Atherosclerosis of renal artery: Secondary | ICD-10-CM

## 2015-08-08 DIAGNOSIS — I4891 Unspecified atrial fibrillation: Secondary | ICD-10-CM

## 2015-08-08 DIAGNOSIS — Z8673 Personal history of transient ischemic attack (TIA), and cerebral infarction without residual deficits: Secondary | ICD-10-CM

## 2015-08-08 DIAGNOSIS — I6523 Occlusion and stenosis of bilateral carotid arteries: Secondary | ICD-10-CM

## 2015-08-08 NOTE — Progress Notes (Signed)
[facility] [place]  Established [facility]  History of Present Illness  Gabriel Gutierrez is a 80 y.o. (12-Mar-1936) male patient of Dr. Kellie Simmering who presents with chief complaint: follow up on renal artery stenosis and s/p aortobiiliac [name] Zenith stent graft placed by Dr. Kellie Simmering in 2007 for abdominal aortic aneurysm. He also returns today for carotid artery Duplex. He had a stroke October, 2013, has a-fib. for which he is taking Jennye Moccasin, now prescribed by Dr. Acie Fredrickson, his cardiologist.  His left side weakness has resolved.  He quit smoking in 1990.  He does have DM, well controlled.   He denies back or abdominal pain.  The patient's blood pressure has been stable. Pt states his blood pressure at home is 120-135/70's. The patient's blood pressure medication regimen has remained stable. The patient's urinary history has remained stable, he does seem to have BPH, Flomax helps.   He had a partial left knee replacement in March 2017. He does not have claudication symptoms with walking, denies non healing wounds.  He attributes his higher blood pressure to drinking coffee before his blood pressure was checked and not taking his blood pressure medication this morning yet.   Pt smoker: former smoker, quit in 1990  Pt meds include: Statin :Yes Betablocker: Yes ASA: No Other anticoagulants/antiplatelets: Xaralto for atrial fib    Past Medical History  Diagnosis Date  . Diabetes mellitus Age 70  . Hyperlipidemia   . AAA (abdominal aortic aneurysm) ([place])   . Hypertension   . Coronary artery disease     post CABG x5 --Severe three-vessel coronary disease  . Arthritis   . Stroke [facility]) Oct. 1, 2013    Mini stroke- Afib  . Irregular heart beat  Feb 18, 2012  . Renal artery stenosis ([place])   . Atrial fibrillation ([place])   . Dysrhythmia    Past Surgical History  Procedure Laterality Date  . Pr vein bypass graft,aorto-fem-pop  12/23/2005    Infrarenal abdominal aortic  aneurysm, right common iliac occlusive disease --   . Colon surgery      ruptured colon  . Median sternotomy    . Hand surgery  1997  . Cataract extraction      left eye  . Skin graft  1992  . Cardiac catheterization  08/26/2005    Est. EF of 65-70% -- Severe three-vessel coronary artery disease -- very heavily calcified vessels and really there is no role for angioplasty  -- he heavily calcified vessels and really there is no role for angioplasty abdominal aortic aneurysm surgery  . Coronary artery bypass graft  09/02/2005    x 5 -- left internal mammary artery graft to the left anterior descending coronary artery, with a saphenous vein graft to the diagonal branch of the LAD, a sequential saphenous vein graft to the second and fourth obtuse marginal branches of the left circumflex coronary artery, and a saphenous vein graft to the posterior descending branch of RCA  -- Endoscopic vein harvesting from the left leg  . Laparotomy  1998  . Cardioversion  04/15/2012    Procedure: CARDIOVERSION;  Surgeon: Thayer Headings, MD;  Location: [place];  Service: Cardiovascular;  Laterality: N/A;  . Eye surgery Left     Cataract  . Eye surgery Right     Cataract  . Partial knee arthroplasty Left 07/17/2015    Procedure: UNICOMPARTMENTAL KNEE;  Surgeon: Vickey Huger, MD;  Location: [place];  Service: Orthopedics;  Laterality: Left;  Social History Social History  Substance Use Topics  . Smoking status: Former Smoker -- 2.00 packs/day for 44 years    Types: Cigarettes    Quit date: 05/20/1988  . Smokeless tobacco: Former Systems developer    Quit date: 02/11/2003  . Alcohol Use: No   Family History Family History  Problem Relation Age of Onset  . Coronary artery disease Mother   . Heart disease Mother     After age 64-Carotid  . Hyperlipidemia Mother   . Stroke Mother   . Coronary artery disease Father   . Hyperlipidemia Father   . Hypertension Father   . Varicose Veins Father   . Heart attack  Father   . Coronary artery disease Sister   . Heart disease Sister     After age 61- BP  . Heart attack Sister   . Diabetes Brother   . Heart disease Brother     After 54 yrs of age  . Heart attack Brother   . Hyperlipidemia Brother   . Alzheimer's disease Sister   . Seizures Sister    Current Outpatient Prescriptions on File Prior to Visit  Medication Sig Dispense Refill  . albuterol (PROVENTIL HFA;VENTOLIN HFA) 108 (90 Base) MCG/ACT inhaler Inhale 1 puff into the lungs every 6 (six) hours as needed for wheezing or shortness of breath.    [name] atorvastatin (LIPITOR) 80 MG tablet TAKE 1 TABLET DAILY (Patient taking differently: Take 80 mg by mouth every evening. TAKE 1 TABLET DAILY) 90 tablet 1  . celecoxib (CELEBREX) 200 MG capsule Take 1 capsule (200 mg total) by mouth daily. 30 capsule 1  . diltiazem (CARDIZEM CD) 120 MG 24 hr capsule TAKE 1 CAPSULE DAILY 90 capsule 2  . L-Methylfolate-B12-B6-B2 (CEREFOLIN) 10-18-48-5 MG TABS Take 1 capsule by mouth 1 day or 1 dose. 30 each 3  . metFORMIN (GLUCOPHAGE) 500 MG tablet Take 500 mg by mouth 2 (two) times daily with a meal.      . methocarbamol (ROBAXIN) 500 MG tablet Take 1 tablet (500 mg total) by mouth every 6 (six) hours as needed for muscle spasms. 90 tablet 0  . Methylfol-Methylcob-Acetylcyst (CEREFOLIN NAC) 6-2-600 MG TABS TAKE 1 TABLET EVERY MORNING. 30 each 3  . metoCLOPramide (REGLAN) 5 MG tablet Take 1-2 tablets (5-10 mg total) by mouth every 8 (eight) hours as needed for nausea (if ondansetron (ZOFRAN) ineffective.). 30 tablet 1  . metoprolol tartrate (LOPRESSOR) 25 MG tablet Take 25 mg by mouth daily.    [name] oxyCODONE (OXY IR/ROXICODONE) 5 MG immediate release tablet Take 1-2 tablets (5-10 mg total) by mouth every 3 (three) hours as needed for breakthrough pain. 60 tablet 0  . oxyCODONE (OXYCONTIN) 10 mg 12 hr tablet Take 1 tablet (10 mg total) by mouth every 12 (twelve) hours. 30 tablet 0  . rivaroxaban (XARELTO) 20 MG TABS tablet Take  1 tablet (20 mg total) by mouth daily with supper. 90 tablet 1  . tamsulosin (FLOMAX) 0.4 MG CAPS capsule Take 0.4 mg by mouth daily.      No current facility-administered medications on file prior to visit.   No Known Allergies   ROS: See HPI for pertinent positives and negatives.  Physical Examination  Filed Vitals:   08/08/15 1039 08/08/15 1044 08/08/15 1046  BP: 156/80 154/80 153/76  Pulse: 97 79 80  Temp: 100 F (37.8 C)    TempSrc: Oral    Resp: 16    Height: 5\' 6"  (1.676 m)    Weight:  170 lb (77.111 kg)    SpO2: 97%     Body mass index is 27.45 kg/(m^2).   General: A&O x 3, WD.  Pulmonary: Sym exp, good air movt, CTAB, no rales, rhonchi, & wheezing.  Cardiac: Irregular rhythm and rate, no peripheral edema.  Vascular:  Vessel  Right  Left   Radial  Palpable  Palpable   Carotid  audible, without bruit  audible, without bruit   Aorta  Not palpable  N/A   Femoral  Palpable  Palpable   Popliteal  Not palpable  Not palpable   PT  Palpable  Palpable   DP  Palpable  Palpable    Gastrointestinal: soft, NTND, -G/R, - HSM, + mid abdominal incisional hernia that is reducible, - CVAT B.  Musculoskeletal: M/S 5/5 throughout, Extremities without ischemic changes.  Neurologic: Pain and light touch intact in extremities, Motor exam as listed above.  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              02/11/12 CTA abd/pelvis:  Aorta: Infrarenal abdominal aortic aneurysm status post endovascular aortic repair with a bifurcated endoprosthesis with suprarenal fixation. Per the operative report, this is a Producer, television/film/video stent graft. The treated aortic aneurysm continues to decrease in size measuring 30 x 22 mm in maximal diameter compared to 33 x  22 mm on the prior study. There is no evidence of endoleak on the arterial phase, or delayed phase images. Grafts limbs remain patent and in unchanged position.  Celiac: Widely patent. Conventional hepatic arterial anatomy.  SMA: Trace atherosclerotic disease at the origin without significant stenosis.  Renals: Single renal arteries bilaterally. There is at least moderate stenosis secondary to noncalcified atherosclerotic plaque at the origin of the left renal artery which has progressed since the prior study. There is mild calcified plaque at the origin of the right renal artery without a significant underlying stenosis.  IMA: Excluded by the stent graft. The distal branches opacify via retrograde flow. No type 2 endoleak.  Inflow: Scattered atherosclerotic vascular calcification without significant stenosis, dissection, or occlusion. Aneurysmal dilatation of the proximal right hypogastric artery to 13 mm has progressed slightly from 10 mm on the prior study. There is a moderate amount of calcified plaque along the posterior wall of the left common femoral artery.  Proximal Outflow: The visualized portions of the bilateral superficial profunda femoris arteries are patent.  Veins: Given the limitations of non venous phase timing, no focal abnormality detected.  NON-VASCULAR  Lower Chest: Visualized heart within normal limits for size. No pericardial effusion. Calcification versus stent in the right coronary artery. Trace calcification of the mitral valve annulus. The distal thoracic esophagus is within normal limits. Lung bases are clear.  Stable 1 cm right renal cyst. No hydronephrosis. No solid renal lesion. Developing renal size discrepancy. The left kidney is now slightly smaller than the right kidney. Comparing to the prior study, the left kidney has decreased in size measuring 10.3 cm in length today compared to 10.8 cm on the prior  study.   IMPRESSION:  1. Stable appearance of successfully treated infrarenal abdominal aortic aneurysm status post EVAR with a Cook Zenith bifurcated endoprosthesis. The excluded aneurysm sac measures slightly smaller in size compared to the prior study. There is no evidence of endoleak.  2. Developing moderately severe ostial  stenosis of the left renal artery. Additionally, there is been an interval 0.5 cm decrease in size of the left kidney since the prior study suggesting that this stenosis may be hemodynamically significant.  3. Mild interval progression in the size of the right internal  iliac artery aneurysm which now measures up to 13 mm compared to 10 mm on the prior study.     Non-Invasive Vascular Imaging (08/08/2015):  CEREBROVASCULAR DUPLEX EVALUATION    INDICATION: Carotid artery disease    PREVIOUS INTERVENTION(S): NA    DUPLEX EXAM: Carotid duplex    RIGHT  LEFT  Peak Systolic Velocities (cm/s) End Diastolic Velocities (cm/s) Plaque LOCATION Peak Systolic Velocities (cm/s) End Diastolic Velocities (cm/s) Plaque  199 14  CCA PROXIMAL 78 12 HT  91 12 HT CCA MID 90 14 HT  85 13 HT CCA DISTAL 82 11 HT  97 8 HT ECA 77 8   135 20 CP ICA PROXIMAL 124 29 CP  99 20  ICA MID 113 30   83 17  ICA DISTAL 180 39     1.4 ICA / CCA Ratio (PSV) 1.3  Antegrade Vertebral Flow Antegrade  - Brachial Systolic Pressure (mmHg) -  Triphasic Brachial Artery Waveforms Triphasic    Plaque Morphology:  HM = Homogeneous, HT = Heterogeneous, CP = Calcific Plaque, SP = Smooth Plaque, IP = Irregular Plaque     ADDITIONAL FINDINGS: Multiphasic subclavian arteries, Arrhythmia. Tortuous left distal internal carotid artery    IMPRESSION: 1. Less than 40% bilateral internal carotid artery stenosis, this may be underestimated due to calcific plaque.    Compared to the previous exam:  No change since exam of 01/19/2014     RENAL ARTERY DUPLEX EVALUATION    INDICATION: Left  renal artery stenosis    PREVIOUS INTERVENTION(S): Endovascular aortic repair in 2007    DUPLEX EXAM:     AORTA Peak Systolic Velocity (cm/s): 68    RIGHT  LEFT   Peak Systolic Velocities (cm/s) Comments  Peak Systolic Velocities (cm/s) Comments  162  Renal Artery Origin/Proximal 256   123  Renal Artery Mid 125   101  Renal Artery Distal 87   -  Accessory Renal Artery (when present) -   2.3  Renal / Aortic Ratio (RAR) 3.7  11.8 Kidney Size (cm) 11.0  Pulsatile  Renal Vein Pulsatile    ADDITIONAL FINDINGS: 1.1 cm x 1.3 cm right renal cyst    IMPRESSION: 1. Less than 60% right renal artery stenosis. 2. Greater than 60% left renal artery stenosis. 3. Technically difficult exam due to extreme bowel gas    Compared to the previous exam:  No change since exam of 08/02/2014       ABDOMINAL AORTA DUPLEX EVALUATION - POST ENDOVASCULAR REPAIR    INDICATION: Follow up endovascular repair    PREVIOUS INTERVENTION(S): EVAR 2007    DUPLEX EXAM:      DIAMETER AP (cm) DIAMETER TRANSVERSE (cm) VELOCITIES (cm/sec)  Aorta 4.4 3.9 69  Right Common Iliac 1.2 1.0 141  Left Common Iliac 1.2 1.0 104    Comparison Study       Date DIAMETER AP (cm) DIAMETER TRANSVERSE (cm)  - - -     ADDITIONAL FINDINGS:     IMPRESSION: 1. Suboptimal exam due to bowel gas and scar tissue 2. 4.4 AP sac size    Compared to the previous exam:  No prior exam       Medical Decision Making  Gabriel August  Gutierrez is a 80 y.o. male who is followed for renal artery stenosis and is s/p aortobiiliac Cook Zenith stent graft placed by Dr. Kellie Simmering in 2007 for abdominal aortic aneurysm. He also has a history of minimal carotid artery stenosis and atrial fib. His blood pressure remains in good control, he has no abdominal or back pain.  He had a stroke October, 2013, has a-fib. for which he is taking Jennye Moccasin, now prescribed by Dr. Acie Fredrickson, his cardiologist.  His left side weakness has resolved.  Today's carotid  duplex suggests less than 40% bilateral internal carotid artery stenosis which may be underestimated due to calcific plaque. No change since exam of 01/19/2014.  Renal artery duplex suggests less than 60% right renal artery stenosis and greater than 60% left renal artery stenosis. Technically difficult exam due to extreme bowel gas. No change since exam of 08/02/2014.  I discussed with Dr. Kellie Simmering the 4.4 cm AP sac size on duplex today considering that the duplex exam was sub optimal due to bowel gas and scar tissue, compared to CTA abd/pelvis on 02/11/12 which demonstrated the treated aortic aneurysm continues to decrease in size measuring 3.0 x 2.2 cm in maximal diameter; see Plan.  Face to face time with patient was 25 minutes. Over 50% of this time was spent on counseling and coordination of care.     I discussed with the patient the importance of surveillance of the endograft.  The next endograft duplex will be scheduled for 12 months along with bilateral renal artery Duplex; carotid artery Duplex in a year.   The patient will follow up with [place] in 12 months with these studies.  I emphasized the importance of maximal medical management including strict control of blood pressure, blood glucose, and lipid levels, antiplatelet agents, obtaining regular exercise, and cessation of smoking.   Thank you for allowing [place] to participate in this patient's care.  Clemon Chambers, RN, MSN, FNP-C [facility] [place] Office: [phone]  Clinic Physician: Early  08/08/2015, 10:36 AM

## 2015-08-08 NOTE — Patient Instructions (Signed)
Renal Artery Stenosis °Renal artery stenosis (RAS) is narrowing of the artery that carries blood to your kidneys. It can affect one or both kidneys. °Your kidneys filter waste and extra fluid from your blood. You get rid of the waste and fluid when you urinate. Your kidneys also make an important chemical messenger (hormone) called renin. Renin helps regulate your blood pressure. The first sign of RAS may be high blood pressure. Over time, other symptoms can develop. °CAUSES  °Plaque buildup in your arteries (atherosclerosis) is the main cause of RAS. The plaques that cause this are made up of: °· Fat. °· Cholesterol. °· Calcium. °· Other substances. °As these substances build up in your renal artery, this slows the blood supply to your kidneys. The lack of blood and oxygen causes the signs and symptoms of RAS. °A much less common cause of RAS is a disease called fibromuscular dysplasia. This disease causes abnormal cell growth that narrows the renal artery. It is not related to atherosclerosis. It occurs mostly in women who are 25-50 years old. It may be passed down through families. °RISK FACTORS  °You may be at risk for renal artery stenosis if you: °· Are a man who is at least 78 years old. °· Are a woman who is at least 78 years old. °· Have high blood pressure. °· Have high cholesterol. °· Are a smoker. °· Abuse alcohol. °· Have diabetes or prediabetes. °· Are overweight. °· Have a family history of early heart disease. °SIGNS AND SYMPTOMS  °RAS usually develops slowly. You may not have any signs or symptoms at first. The earliest signs may be: °· Developing high blood pressure. °· A sudden increase in existing high blood pressure. °· No longer responding to medicine that used to control your blood pressure. °Later signs and symptoms are due to kidney damage. They may include: °· Fatigue. °· Shortness of breath. °· Swollen legs and feet. °· Dry skin. °· Headaches. °· Muscle cramps. °· Loss of  appetite. °· Nausea or vomiting. °DIAGNOSIS  °Your health care provider may suspect RAS based on changes in your blood pressure and your risk factors. A physical exam will be done. Your health care provider may use a stethoscope to listen for a whooshing sound (bruit) that can occur where the renal artery is blocking blood flow. Several tests may be done to confirm a diagnosis of RAS. These may include: °· Blood and urine tests to check your kidney function. °· Imaging tests of your kidneys, such as: °¨ A test that involves using sound waves to create an image of your kidneys and the blood flow to your kidneys (ultrasound). °¨ A test in which dye is injected into one of your blood vessels so images can be taken as the dye flows through your renal arteries (angiogram). These tests can be done using X-rays, a CT scan (computed tomography angiogram, CTA), or a type of MRI (magnetic resonance angiogram, MRA). °TREATMENT  °Making lifestyle changes to reduce your risk factors is the first treatment option for early RAS. If the blood flow to one of your kidneys is cut by more than half, you may need medicine to: °· Lower your blood pressure. This is the main medical treatment for RAS. You may need more than one type of medicine for this. The two types that work best for RAS are: °¨ ACE inhibitors. °¨ Angiotensin receptor blockers. °· Reduce fluid in the body (diuretics). °· Lower your cholesterol (statins). °If medicine is not enough   to control RAS, you may need surgery. This may involve:  Threading a tube with an inflatable balloon into the renal artery to force it open (angioplasty).  Removing plaque from inside the artery (endarterectomy). HOME CARE INSTRUCTIONS  Take medicines only as directed by your health care provider.  Make any lifestyle changes recommended by your health care provider. This may include:  Working with a dietitian to maintain a heart-healthy diet. This type of diet is low in saturated  fat, salt, and added sugar.  Starting an exercise program as directed by your health care provider.  Maintaining a healthy weight.  Quitting smoking.  Not abusing alcohol.  Keep all follow-up visits as directed by your health care provider. This is important. SEEK MEDICAL CARE IF:  Your symptoms of RAS are not getting better.  Your symptoms are changing or getting worse. SEEK IMMEDIATE MEDICAL CARE IF:  You have very bad pain in your back or abdomen.  You have blood in your urine.   This information is not intended to replace advice given to you by your health care provider. Make sure you discuss any questions you have with your health care provider.   Document Released: 01/30/2005 Document Revised: 05/27/2014 Document Reviewed: 08/19/2013 Elsevier Interactive Patient Education 2016 [employer].     Stroke Prevention Some medical conditions and behaviors are associated with an increased chance of having a stroke. You may prevent a stroke by making healthy choices and managing medical conditions. HOW CAN I REDUCE MY RISK OF HAVING A STROKE?   Stay physically active. Get at least 30 minutes of activity on most or all days.  Do not smoke. It may also be helpful to avoid exposure to secondhand smoke.  Limit alcohol use. Moderate alcohol use is considered to be:  No more than 2 drinks per day for men.  No more than 1 drink per day for nonpregnant women.  Eat healthy foods. This involves:  Eating 5 or more servings of fruits and vegetables a day.  Making dietary changes that address high blood pressure (hypertension), high cholesterol, diabetes, or obesity.  Manage your cholesterol levels.  Making food choices that are high in fiber and low in saturated fat, trans fat, and cholesterol may control cholesterol levels.  Take any prescribed medicines to control cholesterol as directed by your health care provider.  Manage your diabetes.  Controlling your carbohydrate  and sugar intake is recommended to manage diabetes.  Take any prescribed medicines to control diabetes as directed by your health care provider.  Control your hypertension.  Making food choices that are low in salt (sodium), saturated fat, trans fat, and cholesterol is recommended to manage hypertension.  Ask your health care provider if you need treatment to lower your blood pressure. Take any prescribed medicines to control hypertension as directed by your health care provider.  If you are 24-51 years of age, have your blood pressure checked every 3-5 years. If you are 68 years of age or older, have your blood pressure checked every year.  Maintain a healthy weight.  Reducing calorie intake and making food choices that are low in sodium, saturated fat, trans fat, and cholesterol are recommended to manage weight.  Stop drug abuse.  Avoid taking birth control pills.  Talk to your health care provider about the risks of taking birth control pills if you are over 22 years old, smoke, get migraines, or have ever had a blood clot.  Get evaluated for sleep disorders (sleep apnea).  Talk to your health care provider about getting a sleep evaluation if you snore a lot or have excessive sleepiness.  Take medicines only as directed by your health care provider.  For some people, aspirin or blood thinners (anticoagulants) are helpful in reducing the risk of forming abnormal blood clots that can lead to stroke. If you have the irregular heart rhythm of atrial fibrillation, you should be on a blood thinner unless there is a good reason you cannot take them.  Understand all your medicine instructions.  Make sure that other conditions (such as anemia or atherosclerosis) are addressed. SEEK IMMEDIATE MEDICAL CARE IF:   You have sudden weakness or numbness of the face, arm, or leg, especially on one side of the body.  Your face or eyelid droops to one side.  You have sudden confusion.  You  have trouble speaking (aphasia) or understanding.  You have sudden trouble seeing in one or both eyes.  You have sudden trouble walking.  You have dizziness.  You have a loss of balance or coordination.  You have a sudden, severe headache with no known cause.  You have new chest pain or an irregular heartbeat. Any of these symptoms may represent a serious problem that is an emergency. Do not wait to see if the symptoms will go away. Get medical help at once. Call your local emergency services (911 in [place] Do not drive yourself to the hospital.   This information is not intended to replace advice given to you by your health care provider. Make sure you discuss any questions you have with your health care provider.   Document Released: 06/13/2004 Document Revised: 05/27/2014 Document Reviewed: 11/06/2012 Elsevier Interactive Patient Education [employer].

## 2015-08-09 NOTE — Addendum Note (Signed)
Addended by: Reola Calkins on: 08/09/2015 01:45 PM   Modules accepted: Orders

## 2015-08-10 DIAGNOSIS — M25662 Stiffness of left knee, not elsewhere classified: Secondary | ICD-10-CM | POA: Diagnosis not present

## 2015-08-10 DIAGNOSIS — R262 Difficulty in walking, not elsewhere classified: Secondary | ICD-10-CM | POA: Diagnosis not present

## 2015-08-10 DIAGNOSIS — M25562 Pain in left knee: Secondary | ICD-10-CM | POA: Diagnosis not present

## 2015-08-14 NOTE — Discharge Summary (Signed)
SPORTS MEDICINE & JOINT REPLACEMENT   Lara Mulch, MD    Carlyon Shadow, PA-C [place], [place], [place]  [address]                             [phone]  PATIENT ID: Gabriel Gutierrez        MRN:  [id number]          DOB/AGE: 1935/06/05 / 80 y.o.    DISCHARGE SUMMARY  ADMISSION DATE:    07/17/2015 DISCHARGE DATE:   07/18/2015   ADMISSION DIAGNOSIS: primary osteoarthritis left knee    DISCHARGE DIAGNOSIS:  primary osteoarthritis left knee    ADDITIONAL DIAGNOSIS: Active Problems:   S/P left unicompartmental knee replacement  Past Medical History  Diagnosis Date  . Diabetes mellitus Age 27  . Hyperlipidemia   . AAA (abdominal aortic aneurysm) ([place])   . Hypertension   . Coronary artery disease     post CABG x5 --Severe three-vessel coronary disease  . Arthritis   . Stroke [facility]) Oct. 1, 2013    Mini stroke- Afib  . Irregular heart beat  Feb 18, 2012  . Renal artery stenosis ([place])   . Atrial fibrillation ([place])   . Dysrhythmia     PROCEDURE: Procedure(s): UNICOMPARTMENTAL KNEE on 07/17/2015  CONSULTS:     HISTORY:  See H&P in chart  HOSPITAL COURSE:  Gabriel Gutierrez is a 80 y.o. admitted on 07/17/2015 and found to have a diagnosis of primary osteoarthritis left knee.  After appropriate laboratory studies were obtained  they were taken to the operating room on 07/17/2015 and underwent Procedure(s): UNICOMPARTMENTAL KNEE.   They were given perioperative antibiotics:  Anti-infectives    Start     Dose/Rate Route Frequency Ordered Stop   07/17/15 1800  ceFAZolin (ANCEF) IVPB 2 g/50 mL premix     2 g 100 mL/hr over 30 Minutes Intravenous Every 6 hours 07/17/15 1344 07/18/15 0048   07/17/15 0600  ceFAZolin (ANCEF) IVPB 2 g/50 mL premix     2 g 100 mL/hr over 30 Minutes Intravenous On call to O.R. 07/16/15 1505 07/17/15 1125    .  Patient given tranexamic acid IV or topical and exparel intra-operatively.  Tolerated the procedure well.    POD# 1: Vital  signs were stable.  Patient denied Chest pain, shortness of breath, or calf pain.  Patient was started on Lovenox 30 mg subcutaneously twice daily at 8am.  Consults to PT, OT, and care management were made.  The patient was weight bearing as tolerated.  CPM was placed on the operative leg 0-90 degrees for 6-8 hours a day. When out of the CPM, patient was placed in the foam block to achieve full extension. Incentive spirometry was taught.  Dressing was changed.       POD #2, Continued  PT for ambulation and exercise program.  IV saline locked.  O2 discontinued.    The remainder of the hospital course was dedicated to ambulation and strengthening.   The patient was discharged on 1 day post op in  Good condition.  Blood products given:none  DIAGNOSTIC STUDIES: Recent vital signs: No data found.      Recent laboratory studies: No results for input(s): WBC, HGB, HCT, PLT in the last 168 hours. No results for input(s): NA, K, CL, CO2, BUN, CREATININE, GLUCOSE, CALCIUM in the last 168 hours. Lab Results  Component Value Date   INR  1.10 07/17/2015   INR 2.04* 07/07/2015   INR 1.05 02/18/2012     Recent Radiographic Studies :  No results found.  DISCHARGE INSTRUCTIONS: Discharge Instructions    CPM    Complete by:  As directed   Continuous passive motion machine (CPM):      Use the CPM from 0 to 90 for 6 hours per day.      You may increase by 10  per day.  You may break it up into 2 or 3 sessions per day.      Use CPM for 2 weeks or until you are told to stop.     Change dressing    Complete by:  As directed   Change dressing on 2/29/2017, then change the dressing daily with sterile 4 x 4 inch gauze dressing and apply TED hose.  You may clean the incision with alcohol prior to redressing.     Discharge instructions    Complete by:  As directed   INSTRUCTIONS AFTER JOINT REPLACEMENT   Remove items at home which could result in a fall. This includes throw rugs or furniture in walking  pathways ICE to the affected joint every three hours while awake for 30 minutes at a time, for at least the first 3-5 days, and then as needed for pain and swelling.  Continue to use ice for pain and swelling. You may notice swelling that will progress down to the foot and ankle.  This is normal after surgery.  Elevate your leg when you are not up walking on it.   Continue to use the breathing machine you got in the hospital (incentive spirometer) which will help keep your temperature down.  It is common for your temperature to cycle up and down following surgery, especially at night when you are not up moving around and exerting yourself.  The breathing machine keeps your lungs expanded and your temperature down.   DIET:  As you were doing prior to hospitalization, we recommend a well-balanced diet.  DRESSING / WOUND CARE / SHOWERING  You may change your dressing 3-5 days after surgery.  Then change the dressing every day with sterile gauze.  Please use good hand washing techniques before changing the dressing.  Do not use any lotions or creams on the incision until instructed by your surgeon.  ACTIVITY  Increase activity slowly as tolerated, but follow the weight bearing instructions below.   No driving for 6 weeks or until further direction given by your physician.  You cannot drive while taking narcotics.  No lifting or carrying greater than 10 lbs. until further directed by your surgeon. Avoid periods of inactivity such as sitting longer than an hour when not asleep. This helps prevent blood clots.  You may return to work once you are authorized by your doctor.     WEIGHT BEARING   Weight bearing as tolerated with assist device (walker, cane, etc) as directed, use it as long as suggested by your surgeon or therapist, typically at least 4-6 weeks.   EXERCISES  Results after joint replacement surgery are often greatly improved when you follow the exercise, range of motion and muscle  strengthening exercises prescribed by your doctor. Safety measures are also important to protect the joint from further injury. Any time any of these exercises cause you to have increased pain or swelling, decrease what you are doing until you are comfortable again and then slowly increase them. If you have problems or questions, call your caregiver  or physical therapist for advice.   Rehabilitation is important following a joint replacement. After just a few days of immobilization, the muscles of the leg can become weakened and shrink (atrophy).  These exercises are designed to build up the tone and strength of the thigh and leg muscles and to improve motion. Often times heat used for twenty to thirty minutes before working out will loosen up your tissues and help with improving the range of motion but do not use heat for the first two weeks following surgery (sometimes heat can increase post-operative swelling).   These exercises can be done on a training (exercise) mat, on the floor, on a table or on a bed. Use whatever works the best and is most comfortable for you.    Use music or television while you are exercising so that the exercises are a pleasant break in your day. This will make your life better with the exercises acting as a break in your routine that you can look forward to.   Perform all exercises about fifteen times, three times per day or as directed.  You should exercise both the operative leg and the other leg as well.   Exercises include:   Quad Sets - Tighten up the muscle on the front of the thigh (Quad) and hold for 5-10 seconds.   Straight Leg Raises - With your knee straight (if you were given a brace, keep it on), lift the leg to 60 degrees, hold for 3 seconds, and slowly lower the leg.  Perform this exercise against resistance later as your leg gets stronger.  Leg Slides: Lying on your back, slowly slide your foot toward your buttocks, bending your knee up off the floor (only go  as far as is comfortable). Then slowly slide your foot back down until your leg is flat on the floor again.  Angel Wings: Lying on your back spread your legs to the side as far apart as you can without causing discomfort.  Hamstring Strength:  Lying on your back, push your heel against the floor with your leg straight by tightening up the muscles of your buttocks.  Repeat, but this time bend your knee to a comfortable angle, and push your heel against the floor.  You may put a pillow under the heel to make it more comfortable if necessary.   A rehabilitation program following joint replacement surgery can speed recovery and prevent re-injury in the future due to weakened muscles. Contact your doctor or a physical therapist for more information on knee rehabilitation.    CONSTIPATION  Constipation is defined medically as fewer than three stools per week and severe constipation as less than one stool per week.  Even if you have a regular bowel pattern at home, your normal regimen is likely to be disrupted due to multiple reasons following surgery.  Combination of anesthesia, postoperative narcotics, change in appetite and fluid intake all can affect your bowels.   YOU MUST use at least one of the following options; they are listed in order of increasing strength to get the job done.  They are all available over the counter, and you may need to use some, POSSIBLY even all of these options:    Drink plenty of fluids (prune juice may be helpful) and high fiber foods Colace 100 mg by mouth twice a day  Senokot for constipation as directed and as needed Dulcolax (bisacodyl), take with full glass of water  Miralax (polyethylene glycol) once or twice a  day as needed.  If you have tried all these things and are unable to have a bowel movement in the first 3-4 days after surgery call either your surgeon or your primary doctor.    If you experience loose stools or diarrhea, hold the medications until you  stool forms back up.  If your symptoms do not get better within 1 week or if they get worse, check with your doctor.  If you experience "the worst abdominal pain ever" or develop nausea or vomiting, please contact the office immediately for further recommendations for treatment.   ITCHING:  If you experience itching with your medications, try taking only a single pain pill, or even half a pain pill at a time.  You can also use Benadryl over the counter for itching or also to help with sleep.   TED HOSE STOCKINGS:  Use stockings on both legs until for at least 2 weeks or as directed by physician office. They may be removed at night for sleeping.  MEDICATIONS:  See your medication summary on the "After Visit Summary" that nursing will review with you.  You may have some home medications which will be placed on hold until you complete the course of blood thinner medication.  It is important for you to complete the blood thinner medication as prescribed.  PRECAUTIONS:  If you experience chest pain or shortness of breath - call 911 immediately for transfer to the hospital emergency department.   If you develop a fever greater that 101 F, purulent drainage from wound, increased redness or drainage from wound, foul odor from the wound/dressing, or calf pain - CONTACT YOUR SURGEON.                                                   FOLLOW-UP APPOINTMENTS:  If you do not already have a post-op appointment, please call the office for an appointment to be seen by your surgeon.  Guidelines for how soon to be seen are listed in your "After Visit Summary", but are typically between 1-4 weeks after surgery.  OTHER INSTRUCTIONS:   Knee Replacement:  Do not place pillow under knee, focus on keeping the knee straight while resting. CPM instructions: 0-90 degrees, 2 hours in the morning, 2 hours in the afternoon, and 2 hours in the evening. Place foam block, curve side up under heel at all times except when in CPM or  when walking.  DO NOT modify, tear, cut, or change the foam block in any way.  MAKE SURE YOU:  Understand these instructions.  Get help right away if you are not doing well or get worse.    Thank you for letting us be a part of your medical care team.  It is a privilege we respect greatly.  We hope these instructions will help you stay on track for a fast and full recovery!     Driving restrictions    Complete by:  As directed   No driving for 2 weeks     Weight bearing as tolerated    Complete by:  As directed            DISCHARGE MEDICATIONS:     Medication List    STOP taking these medications        CEREFOLIN 10-18-48-5 MG Tabs     metoprolol succinate 50  MG 24 hr tablet  Commonly known as:  TOPROL-XL      TAKE these medications        albuterol 108 (90 Base) MCG/ACT inhaler  Commonly known as:  PROVENTIL HFA;VENTOLIN HFA  Inhale 1 puff into the lungs every 6 (six) hours as needed for wheezing or shortness of breath.     atorvastatin 80 MG tablet  Commonly known as:  LIPITOR  TAKE 1 TABLET DAILY     celecoxib 200 MG capsule  Commonly known as:  CELEBREX  Take 1 capsule (200 mg total) by mouth daily.     diltiazem 120 MG 24 hr capsule  Commonly known as:  CARDIZEM CD  TAKE 1 CAPSULE DAILY     metFORMIN 500 MG tablet  Commonly known as:  GLUCOPHAGE  Take 500 mg by mouth 2 (two) times daily with a meal.     methocarbamol 500 MG tablet  Commonly known as:  ROBAXIN  Take 1 tablet (500 mg total) by mouth every 6 (six) hours as needed for muscle spasms.     metoCLOPramide 5 MG tablet  Commonly known as:  REGLAN  Take 1-2 tablets (5-10 mg total) by mouth every 8 (eight) hours as needed for nausea (if ondansetron (ZOFRAN) ineffective.).     metoprolol tartrate 25 MG tablet  Commonly known as:  LOPRESSOR  Take 25 mg by mouth daily.     oxyCODONE 5 MG immediate release tablet  Commonly known as:  Oxy IR/ROXICODONE  Take 1-2 tablets (5-10 mg total) by mouth  every 3 (three) hours as needed for breakthrough pain.     oxyCODONE 10 mg 12 hr tablet  Commonly known as:  OXYCONTIN  Take 1 tablet (10 mg total) by mouth every 12 (twelve) hours.     rivaroxaban 20 MG Tabs tablet  Commonly known as:  XARELTO  Take 1 tablet (20 mg total) by mouth daily with supper.     tamsulosin 0.4 MG Caps capsule  Commonly known as:  FLOMAX  Take 0.4 mg by mouth daily.        FOLLOW UP VISIT:       Follow-up Information    Follow up with Rudean Haskell, MD. Call on 07/25/2015.   Specialty:  Orthopedic Surgery   Contact information:   [place] [address] [phone]       Follow up with [facility].   Why:  They will contact you to schedule home therapy visits.   Contact information:   [place] [id number] [phone]       DISPOSITION: HOME VS. SNF  CONDITION:  Good   Donia Ast 08/14/2015, 6:42 AM

## 2015-08-15 DIAGNOSIS — M25562 Pain in left knee: Secondary | ICD-10-CM | POA: Diagnosis not present

## 2015-08-15 DIAGNOSIS — M25662 Stiffness of left knee, not elsewhere classified: Secondary | ICD-10-CM | POA: Diagnosis not present

## 2015-08-15 DIAGNOSIS — R262 Difficulty in walking, not elsewhere classified: Secondary | ICD-10-CM | POA: Diagnosis not present

## 2015-08-17 DIAGNOSIS — M25562 Pain in left knee: Secondary | ICD-10-CM | POA: Diagnosis not present

## 2015-08-17 DIAGNOSIS — M25662 Stiffness of left knee, not elsewhere classified: Secondary | ICD-10-CM | POA: Diagnosis not present

## 2015-08-17 DIAGNOSIS — R262 Difficulty in walking, not elsewhere classified: Secondary | ICD-10-CM | POA: Diagnosis not present

## 2015-08-23 DIAGNOSIS — M25662 Stiffness of left knee, not elsewhere classified: Secondary | ICD-10-CM | POA: Diagnosis not present

## 2015-08-23 DIAGNOSIS — R262 Difficulty in walking, not elsewhere classified: Secondary | ICD-10-CM | POA: Diagnosis not present

## 2015-08-23 DIAGNOSIS — M25562 Pain in left knee: Secondary | ICD-10-CM | POA: Diagnosis not present

## 2015-08-24 ENCOUNTER — Other Ambulatory Visit: Payer: Self-pay | Admitting: Cardiovascular Disease

## 2015-08-24 DIAGNOSIS — M25562 Pain in left knee: Secondary | ICD-10-CM | POA: Diagnosis not present

## 2015-08-24 DIAGNOSIS — M25662 Stiffness of left knee, not elsewhere classified: Secondary | ICD-10-CM | POA: Diagnosis not present

## 2015-08-24 DIAGNOSIS — R262 Difficulty in walking, not elsewhere classified: Secondary | ICD-10-CM | POA: Diagnosis not present

## 2015-08-31 DIAGNOSIS — M25662 Stiffness of left knee, not elsewhere classified: Secondary | ICD-10-CM | POA: Diagnosis not present

## 2015-08-31 DIAGNOSIS — M25562 Pain in left knee: Secondary | ICD-10-CM | POA: Diagnosis not present

## 2015-08-31 DIAGNOSIS — R262 Difficulty in walking, not elsewhere classified: Secondary | ICD-10-CM | POA: Diagnosis not present

## 2015-09-12 DIAGNOSIS — M25662 Stiffness of left knee, not elsewhere classified: Secondary | ICD-10-CM | POA: Diagnosis not present

## 2015-09-12 DIAGNOSIS — M25562 Pain in left knee: Secondary | ICD-10-CM | POA: Diagnosis not present

## 2015-09-12 DIAGNOSIS — R262 Difficulty in walking, not elsewhere classified: Secondary | ICD-10-CM | POA: Diagnosis not present

## 2015-10-13 ENCOUNTER — Other Ambulatory Visit: Payer: Self-pay | Admitting: Cardiovascular Disease

## 2015-11-24 ENCOUNTER — Ambulatory Visit: Payer: Medicare Other | Admitting: Cardiovascular Disease

## 2015-11-27 DIAGNOSIS — N183 Chronic kidney disease, stage 3 (moderate): Secondary | ICD-10-CM | POA: Diagnosis not present

## 2015-11-27 DIAGNOSIS — I10 Essential (primary) hypertension: Secondary | ICD-10-CM | POA: Diagnosis not present

## 2015-11-27 DIAGNOSIS — E1149 Type 2 diabetes mellitus with other diabetic neurological complication: Secondary | ICD-10-CM | POA: Diagnosis not present

## 2015-11-27 DIAGNOSIS — I6340 Cerebral infarction due to embolism of unspecified cerebral artery: Secondary | ICD-10-CM | POA: Diagnosis not present

## 2015-11-27 DIAGNOSIS — E1129 Type 2 diabetes mellitus with other diabetic kidney complication: Secondary | ICD-10-CM | POA: Diagnosis not present

## 2015-11-27 DIAGNOSIS — Z6829 Body mass index (BMI) 29.0-29.9, adult: Secondary | ICD-10-CM | POA: Diagnosis not present

## 2015-11-27 DIAGNOSIS — E785 Hyperlipidemia, unspecified: Secondary | ICD-10-CM | POA: Diagnosis not present

## 2015-11-27 DIAGNOSIS — I4891 Unspecified atrial fibrillation: Secondary | ICD-10-CM | POA: Diagnosis not present

## 2015-11-27 DIAGNOSIS — E1140 Type 2 diabetes mellitus with diabetic neuropathy, unspecified: Secondary | ICD-10-CM | POA: Diagnosis not present

## 2015-12-18 ENCOUNTER — Encounter: Payer: Self-pay | Admitting: Nurse Practitioner

## 2015-12-18 ENCOUNTER — Ambulatory Visit (INDEPENDENT_AMBULATORY_CARE_PROVIDER_SITE_OTHER): Payer: Medicare Other | Admitting: Nurse Practitioner

## 2015-12-18 VITALS — BP 160/90 | HR 71 | Ht 66.0 in | Wt 173.4 lb

## 2015-12-18 DIAGNOSIS — E785 Hyperlipidemia, unspecified: Secondary | ICD-10-CM

## 2015-12-18 DIAGNOSIS — I482 Chronic atrial fibrillation, unspecified: Secondary | ICD-10-CM

## 2015-12-18 DIAGNOSIS — I10 Essential (primary) hypertension: Secondary | ICD-10-CM

## 2015-12-18 DIAGNOSIS — Z7901 Long term (current) use of anticoagulants: Secondary | ICD-10-CM

## 2015-12-18 DIAGNOSIS — I25810 Atherosclerosis of coronary artery bypass graft(s) without angina pectoris: Secondary | ICD-10-CM

## 2015-12-18 DIAGNOSIS — I701 Atherosclerosis of renal artery: Secondary | ICD-10-CM

## 2015-12-18 NOTE — Progress Notes (Signed)
CARDIOLOGY OFFICE NOTE  Date:  12/18/2015    Gabriel Gutierrez Date of Birth: 1936-05-19 Medical Record [id number]  PCP:  Lillard Anes, MD  Cardiologist:  Nahser  Chief Complaint  Patient presents with  . Coronary Artery Disease  . Hyperlipidemia  . Hypertension  . Atrial Fibrillation    6 month check - seen for Dr. Acie Fredrickson    History of Present Illness: Gabriel Gutierrez is a 80 y.o. male who presents today for a 6 month check. Seen for Dr. Acie Fredrickson.   He has a history of CAD with prior CABG, DM, AAA with prior stent graft repair, HLD, HTN, RAS (followed by [facility]), AF - with prior failed cardioversion, chronic anticoagulation with Xarelto, and prior stroke. Has had to have his ACE stopped due to hyperkalemia.   Last seen back in January and felt to be doing well.   Comes in today. Here with his wife - she is being seen today as well. He is doing well. No chest pain. Not short of breath. Golfing some. Some walking. No passing out spells. No awareness of his AF. Complains of incontinence of his stool but notes if he uses Metamucil daily he is not bothered by this. Tolerating his medicines. Labs are brought in from PCP and look ok - he is borderline diabetic.   Past Medical History:  Diagnosis Date  . AAA (abdominal aortic aneurysm) ([place])   . Arthritis   . Atrial fibrillation ([place])   . Coronary artery disease    post CABG x5 --Severe three-vessel coronary disease  . Diabetes mellitus Age 89  . Dysrhythmia   . Hyperlipidemia   . Hypertension   . Irregular heart beat  Feb 18, 2012  . Renal artery stenosis ([place])   . Stroke [facility]) Oct. 1, 2013   Mini stroke- Afib    Past Surgical History:  Procedure Laterality Date  . CARDIAC CATHETERIZATION  08/26/2005   Est. EF of 65-70% -- Severe three-vessel coronary artery disease -- very heavily calcified vessels and really there is no role for angioplasty  -- he heavily calcified vessels and really there is no role for  angioplasty abdominal aortic aneurysm surgery  . CARDIOVERSION  04/15/2012   Procedure: CARDIOVERSION;  Surgeon: Thayer Headings, MD;  Location: [place];  Service: Cardiovascular;  Laterality: N/A;  . CATARACT EXTRACTION     left eye  . COLON SURGERY     ruptured colon  . CORONARY ARTERY BYPASS GRAFT  09/02/2005   x 5 -- left internal mammary artery graft to the left anterior descending coronary artery, with a saphenous vein graft to the diagonal branch of the LAD, a sequential saphenous vein graft to the second and fourth obtuse marginal branches of the left circumflex coronary artery, and a saphenous vein graft to the posterior descending branch of RCA  -- Endoscopic vein harvesting from the left leg  . EYE SURGERY Left    Cataract  . EYE SURGERY Right    Cataract  . HAND SURGERY  1997  . LAPAROTOMY  1998  . MEDIAN STERNOTOMY    . PARTIAL KNEE ARTHROPLASTY Left 07/17/2015   Procedure: UNICOMPARTMENTAL KNEE;  Surgeon: Vickey Huger, MD;  Location: [place];  Service: Orthopedics;  Laterality: Left;  . PR VEIN BYPASS GRAFT,AORTO-FEM-POP  12/23/2005   Infrarenal abdominal aortic aneurysm, right common iliac occlusive disease --   . SKIN GRAFT  1992     Medications: Current Outpatient Prescriptions  Medication  Sig Dispense Refill  . albuterol (PROVENTIL HFA;VENTOLIN HFA) 108 (90 Base) MCG/ACT inhaler Inhale 1 puff into the lungs every 6 (six) hours as needed for wheezing or shortness of breath.    [name] atorvastatin (LIPITOR) 80 MG tablet TAKE 1 TABLET DAILY (Patient taking differently: Take 80 mg by mouth every evening. TAKE 1 TABLET DAILY) 90 tablet 1  . celecoxib (CELEBREX) 200 MG capsule Take 1 capsule (200 mg total) by mouth daily. 30 capsule 1  . diltiazem (CARDIZEM CD) 120 MG 24 hr capsule Take 1 capsule (120 mg total) by mouth daily. 90 capsule 2  . L-Methylfolate-B12-B6-B2 (CEREFOLIN) 10-18-48-5 MG TABS Take 1 capsule by mouth 1 day or 1 dose. 30 each 3  . metFORMIN (GLUCOPHAGE) 500  MG tablet Take 500 mg by mouth 2 (two) times daily with a meal.      . Methylfol-Methylcob-Acetylcyst (CEREFOLIN NAC) 6-2-600 MG TABS TAKE 1 TABLET EVERY MORNING. 30 each 3  . metoprolol tartrate (LOPRESSOR) 25 MG tablet Take 25 mg by mouth daily.    . rivaroxaban (XARELTO) 20 MG TABS tablet Take 1 tablet (20 mg total) by mouth daily with supper. 90 tablet 2  . tamsulosin (FLOMAX) 0.4 MG CAPS capsule Take 0.4 mg by mouth daily.      No current facility-administered medications for this visit.     Allergies: No Known Allergies  Social History: The patient  reports that he quit smoking about 27 years ago. His smoking use included Cigarettes. He has a 88.00 pack-year smoking history. He quit smokeless tobacco use about 12 years ago. He reports that he does not drink alcohol or use drugs.   Family History: The patient's family history includes Alzheimer's disease in his sister; Coronary artery disease in his father, mother, and sister; Diabetes in his brother; Heart attack in his brother, father, and sister; Heart disease in his brother, mother, and sister; Hyperlipidemia in his brother, father, and mother; Hypertension in his father; Seizures in his sister; Stroke in his mother; Varicose Veins in his father.   Review of Systems: Please see the history of present illness.   Otherwise, the review of systems is positive for none.   All other systems are reviewed and negative.   Physical Exam: VS:  BP (!) 160/90   Pulse 71   Ht 5\' 6"  (1.676 m)   Wt 173 lb 6.4 oz (78.7 kg)   SpO2 99% Comment: at rest  BMI 27.99 kg/m  .  BMI Body mass index is 27.99 kg/m.  Wt Readings from Last 3 Encounters:  12/18/15 173 lb 6.4 oz (78.7 kg)  08/08/15 170 lb (77.1 kg)  07/17/15 167 lb (75.8 kg)   BP is 120/80 by me.   General: Pleasant. Well developed, well nourished and in no acute distress.   HEENT: Normal.  Neck: Supple, no JVD, carotid bruits, or masses noted.  Cardiac: Irregular irregular rhythm.  Rate is ok.  No murmurs, rubs, or gallops. No edema.  Respiratory:  Lungs are clear to auscultation bilaterally with normal work of breathing.  GI: Soft and nontender.  MS: No deformity or atrophy. Gait and ROM intact.  Skin: Warm and dry. Color is normal.  Neuro:  Strength and sensation are intact and no gross focal deficits noted.  Psych: Alert, appropriate and with normal affect.   LABORATORY DATA:  EKG:  EKG is not ordered today.  Lab Results  Component Value Date   WBC 15.3 (H) 07/18/2015   HGB 12.3 (L) 07/18/2015  HCT 37.8 (L) 07/18/2015   PLT 236 07/18/2015   GLUCOSE 125 (H) 07/18/2015   CHOL 126 02/18/2012   TRIG 87 02/18/2012   HDL 48 02/18/2012   LDLCALC 61 02/18/2012   ALT 18 07/07/2015   AST 25 07/07/2015   NA 140 07/18/2015   K 4.4 07/18/2015   CL 106 07/18/2015   CREATININE 1.35 (H) 07/18/2015   BUN 21 (H) 07/18/2015   CO2 25 07/18/2015   TSH 2.920 02/02/2014   INR 1.10 07/17/2015   HGBA1C 6.3 (H) 07/17/2015    BNP (last 3 results) No results for input(s): BNP in the last 8760 hours.  ProBNP (last 3 results) No results for input(s): PROBNP in the last 8760 hours.   Other Studies Reviewed Today:   Assessment/Plan: 1. CAD - No active symptoms. Would continue with current regimen and CV risk factor modification.   2. HTN - repeat by me is ok. Have left on his current regimen.   3. HLD - recent labs stable. LDL is 48.   4. Chronic AF - managed with rate control and anticoagulation.   5. Chronic anticoagulation - no problems noted.   6. Prior stroke  Current medicines are reviewed with the patient today.  The patient does not have concerns regarding medicines other than what has been noted above.  The following changes have been made:  See above.  Labs/ tests ordered today include:   No orders of the defined types were placed in this encounter.    Disposition:   FU with Dr. Acie Fredrickson in 6 months.    Patient is agreeable to this plan and  will call if any problems develop in the interim.   Signed: Burtis Junes, RN, ANP-C 12/18/2015 9:44 AM  [facility] [address] [place], [place]  [address] Phone: [phone] Fax: [phone]

## 2015-12-18 NOTE — Patient Instructions (Addendum)
We will be checking the following labs today - NONE   Medication Instructions:    Continue with your current medicines.     Testing/Procedures To Be Arranged:  N/A  Follow-Up:   See Dr. Nahser in 6 months    Other Special Instructions:   Stay active    If you need a refill on your cardiac medications before your next appointment, please call your pharmacy.   Call the [facility] office at [phone] if you have any questions, problems or concerns.      

## 2015-12-20 ENCOUNTER — Encounter: Payer: Self-pay | Admitting: Neurology

## 2015-12-20 ENCOUNTER — Ambulatory Visit (INDEPENDENT_AMBULATORY_CARE_PROVIDER_SITE_OTHER): Payer: Medicare Other | Admitting: Neurology

## 2015-12-20 VITALS — BP 109/63 | HR 70 | Ht 66.0 in | Wt 173.8 lb

## 2015-12-20 DIAGNOSIS — G3184 Mild cognitive impairment, so stated: Secondary | ICD-10-CM | POA: Diagnosis not present

## 2015-12-20 DIAGNOSIS — I701 Atherosclerosis of renal artery: Secondary | ICD-10-CM | POA: Diagnosis not present

## 2015-12-20 NOTE — Patient Instructions (Signed)
I had a long discussion with the patient and his wife regarding his mild cognitive impairment which appears to be stable. I recommend he continue Cerefolin NAC daily as well as add Reservetarol 200 mg daily. He was encouraged to continue participation in cognitively challenging activities like solving crossword puzzles, sudoku. He will continue Xarelto for atrial fibrillation for stroke prevention.  He was advised to continue maintaining strict control of lipids with LDL cholesterol goal below 70 mg percent, hypertension with blood pressure goal below 130/90 and diabetes with hemoglobin A1c goal below 6.5.% She will return for follow-up in one year or call earlier if necessary.  Memory Compensation Strategies  1. Use "WARM" strategy.  W= write it down  A= associate it  R= repeat it  M= make a mental note  2.   You can keep a Social worker.  Use a 3-ring notebook with sections for the following: calendar, important names and phone numbers,  medications, doctors' names/phone numbers, lists/reminders, and a section to journal what you did  each day.   3.    Use a calendar to write appointments down.  4.    Write yourself a schedule for the day.  This can be placed on the calendar or in a separate section of the Memory Notebook.  Keeping a  regular schedule can help memory.  5.    Use medication organizer with sections for each day or morning/evening pills.  You may need help loading it  6.    Keep a basket, or pegboard by the door.  Place items that you need to take out with you in the basket or on the pegboard.  You may also want to  include a message board for reminders.  7.    Use sticky notes.  Place sticky notes with reminders in a place where the task is performed.  For example: " turn off the  stove" placed by the stove, "lock the door" placed on the door at eye level, " take your medications" on  the bathroom mirror or by the place where you normally take your medications.  8.     Use alarms/timers.  Use while cooking to remind yourself to check on food or as a reminder to take your medicine, or as a  reminder to make a call, or as a reminder to perform another task, etc.

## 2015-12-20 NOTE — Progress Notes (Signed)
[facility] [address] [place]. [place] [address] [phone]       OFFICE FOLLOW UP VISIT NOTE  [name] Date of Birth:  Dec 22, 1935 Medical Record Number:  [id number]   Referring MD:  Reinaldo Meeker  Reason for Referral:  Vision and memory loss  HPI: 80 year male who had a transient episode of vision loss in the right eye on 01/28/14. He noticed cloudiness which she describes as a horizontal line in his right eye and he could not see below the line the bottom half but he was able to see above it. This lasted only a few minutes and recovered. He denied any accompanying of following headache for seeing zigzag patterns or lines. He is dizzy had a similar episode in November 2014 when he had bilateral vision loss involving the lower half of the field which also lasted only a minute or so. He did not seek any medical help her evaluation in that time period. She denies any chronic headaches, muscle aches, pain, scalp tenderness or jaw claudication. He was seen by me in October 2013 when he had a right parietal insular cortex infarct and was diagnosed with new-onset atrial fibrillation. He is done well from that stroke and recovered completely. He has been on Xarelto of and has been tolerated well without bleeding or bruising. He is in a complain of memory loss particularly not remembering dates and names as well as his hands. He gets frustrated from this and got treated recently. He however remains independent of activities of daily living. His wife handles the finances but the patient can drive and manage his own affairs. He states his blood pressure control has been quite good and his fasting sugars usually run below 120. Update 06/07/2014 : He returns for follow-up after last visit 6 months ago. He continues to do well and has not had a recurrent stroke or TIA symptoms. He saw ophthalmologist Dr. Samara Snide who felt he must have had cholesterol emboli. The patient did  undergo MRI scan of the brain on 02/14/14 which showed no acute abnormality with changes of chronic microvascular ischemia and generalized cerebral atrophy. MRA of the brain and neck did not reveal significant extra or intracranial vascular stenosis. Lab work  On 02/02/14 showed normal vitamin B12, TSH and RPR. Today on the Mini-Mental Status exam score 28/30. He had a colonoscopy yesterday and had held his xarelto for 5 days. He had 3 polyps removed and plans on restarting Xarelto he has not been doing mentally challenging exercises. Update 12/06/2014 : He returns for follow-up after last visit 6 months ago. He is accompanied by his wife feels that his memory may be somewhat better. He is taking Cerefolin but finds it quite expensive. He continues to have trouble remembering mainly recent information the memory from the past seems quite good. He is independent in activities of daily living. There have been no changes in behavior, agitation, hallucinations or nightmares or delusions noted. He continues to be bothered by left knee pain and wears a knee brace. He has not had any recurrent stroke or TIA symptoms and remains on Xarelto which is tolerating well without significant bleeding or bruising. His blood pressure seems well controlled. Lipid profile has not been checked recently. Update 06/20/2015:  He returns for follow-up after last visit 6 months ago. He is accompanied by son. He states is doing well and his memory is quite stable. Acute symptoms are quite active. He does mentally challenging  activities like crossword puzzles. Is physically also quite active. He unfortunately is having severe left knee pain and requires knee replacement which is scheduled for a couple of weeks. He remains on Xarelto which is tolerating well without side effects. He had taken Cerefolin which I had prescribed at last visit but when he ran out of the prescription his did not call for refill. Update 12/20/2015 : He returns for  follow-up after last visit 6 months ago. Is accompanied by his wife. He remained stable from neurovascular standpoint without recurrent TIA or stroke symptoms. He did undergo knee replacement successfully and is back on Xarelto which is tolerating well with only minor bruising but no bleeding. He unfortunately lost his 53-year-old daughter as well as his diet recently. He continues to have short-term memory difficulties and very occasionally word finding as well. He had run out of Cerefolin nac noticed that his cognitive difficulties got worse but now he  is back on it`s generic version and feels better. He did not start Resevetrol as had recommended last time. He does keep himself mentally challenged by taking on building or painting projects.. He states his blood pressure is well controlled and today it is 109/63. He has no new complaints today. ROS:   14 system review of systems is positive for hearing loss,  , memory loss and all other systems negative  PMH:  Past Medical History:  Diagnosis Date  . AAA (abdominal aortic aneurysm) ([place])   . Arthritis   . Atrial fibrillation ([place])   . Coronary artery disease    post CABG x5 --Severe three-vessel coronary disease  . Diabetes mellitus Age 40  . Dysrhythmia   . Hyperlipidemia   . Hypertension   . Irregular heart beat  Feb 18, 2012  . Renal artery stenosis ([place])   . Stroke [facility]) Oct. 1, 2013   Mini stroke- Afib    Social History:  Social History   Social History  . Marital status: Married    Spouse name: N/A  . Number of children: N/A  . Years of education: N/A   Occupational History  . Not on file.   Social History Main Topics  . Smoking status: Former Smoker    Packs/day: 2.00    Years: 44.00    Types: Cigarettes    Quit date: 05/20/1988  . Smokeless tobacco: Former Systems developer    Quit date: 02/11/2003  . Alcohol use No  . Drug use: No  . Sexual activity: No   Other Topics Concern  . Not on file   Social History Narrative  . No  narrative on file    Medications:   Current Outpatient Prescriptions on File Prior to Visit  Medication Sig Dispense Refill  . albuterol (PROVENTIL HFA;VENTOLIN HFA) 108 (90 Base) MCG/ACT inhaler Inhale 1 puff into the lungs every 6 (six) hours as needed for wheezing or shortness of breath.    [name] atorvastatin (LIPITOR) 80 MG tablet TAKE 1 TABLET DAILY (Patient taking differently: Take 80 mg by mouth every evening. TAKE 1 TABLET DAILY) 90 tablet 1  . diltiazem (CARDIZEM CD) 120 MG 24 hr capsule Take 1 capsule (120 mg total) by mouth daily. 90 capsule 2  . L-Methylfolate-B12-B6-B2 (CEREFOLIN) 10-18-48-5 MG TABS Take 1 capsule by mouth 1 day or 1 dose. 30 each 3  . metFORMIN (GLUCOPHAGE) 500 MG tablet Take 500 mg by mouth 2 (two) times daily with a meal.      . metoprolol tartrate (LOPRESSOR) 25 MG tablet  Take 25 mg by mouth daily.    . rivaroxaban (XARELTO) 20 MG TABS tablet Take 1 tablet (20 mg total) by mouth daily with supper. 90 tablet 2  . tamsulosin (FLOMAX) 0.4 MG CAPS capsule Take 0.4 mg by mouth daily.      No current facility-administered medications on file prior to visit.     Allergies:  No Known Allergies  Physical Exam General: well developed, well nourished elderly male, seated, in no evident distress Head: head normocephalic and atraumatic.  Neck: supple with no carotid or supraclavicular bruits Cardiovascular: regular rate and rhythm, no murmurs Musculoskeletal: no deformity Skin:  no rash/petichiae Vascular:  Normal pulses all extremities Vitals:   12/20/15 1340  BP: 109/63  Pulse: 70    Neurologic Exam Mental Status: Awake and fully alert. Oriented to place and time. Recent and remote memory diminished. Attention span, concentration and fund of knowledge appropriate. Mood and affect appropriate. Mini-Mental status exam not done today. Recall 2/3. AFT 14.. Clock drawing 4/4.   Cranial Nerves: Fundoscopic exam not done . Pupils equal, briskly reactive to light.  Extraocular movements full without nystagmus. Visual fields full to confrontation. Hearing diminished bilaterally. Facial sensation intact. Face, tongue, palate moves normally and symmetrically.  Motor: Normal bulk and tone. Normal strength in all tested extremity muscles. Sensory.: intact to touch and pinprick and vibratory sensation.  Coordination: Rapid alternating movements normal in all extremities. Finger-to-nose and heel-to-shin performed accurately bilaterally. Gait and Station: Arises from chair without difficulty. Stance is normal. Gait demonstrates normal stride length and balance . Unable to heel, toe and tandem walk without difficulty.  Reflexes: 1+ and symmetric. Toes downgoing.     ASSESSMENT: 80 year male with transient episodes of visual loss of unclear etiology possibly small vessel retinal TIAs. He complains of memory and cognitive difficulties likely from mild cognitive impairment but strong family history of Alzheimer's and is concerning. Remote history of embolic right MCA branch infarct in October 2013 from atrial fibrillation with good neurological recovery    PLAN: I had a long discussion with the patient and his wife regarding his mild cognitive impairment which appears to be stable. I recommend he continue Cerefolin NAC daily as well as add Reservetarol 200 mg daily. He was encouraged to continue participation in cognitively challenging activities like solving crossword puzzles, sudoku. He will continue Xarelto for atrial fibrillation for stroke prevention.  He was advised to continue maintaining strict control of lipids with LDL cholesterol goal below 70 mg percent, hypertension with blood pressure goal below 130/90 and diabetes with hemoglobin A1c goal below 6.5.% Greater than 50% time during this 25 minute visit was spent on counseling and coordination of care upon mild cognitive impairment and stroke risk He will return for follow-up in one year or call earlier if  necessary  Antony Contras, MD Note: This document was prepared with digital dictation and possible smart phrase technology. Any transcriptional errors that result from this process are unintentional.

## 2016-01-31 ENCOUNTER — Other Ambulatory Visit: Payer: Self-pay | Admitting: Neurology

## 2016-02-06 ENCOUNTER — Other Ambulatory Visit: Payer: Self-pay

## 2016-02-06 DIAGNOSIS — G3184 Mild cognitive impairment, so stated: Secondary | ICD-10-CM

## 2016-02-06 MED ORDER — CEREFOLIN 6-1-50-5 MG PO TABS: 1.0000 | ORAL_TABLET | ORAL | 3 refills | Status: DC

## 2016-03-19 DIAGNOSIS — E119 Type 2 diabetes mellitus without complications: Secondary | ICD-10-CM | POA: Diagnosis not present

## 2016-03-19 DIAGNOSIS — Z7984 Long term (current) use of oral hypoglycemic drugs: Secondary | ICD-10-CM | POA: Diagnosis not present

## 2016-04-01 DIAGNOSIS — E1129 Type 2 diabetes mellitus with other diabetic kidney complication: Secondary | ICD-10-CM | POA: Diagnosis not present

## 2016-04-01 DIAGNOSIS — I6340 Cerebral infarction due to embolism of unspecified cerebral artery: Secondary | ICD-10-CM | POA: Diagnosis not present

## 2016-04-01 DIAGNOSIS — Z23 Encounter for immunization: Secondary | ICD-10-CM | POA: Diagnosis not present

## 2016-04-01 DIAGNOSIS — I4891 Unspecified atrial fibrillation: Secondary | ICD-10-CM | POA: Diagnosis not present

## 2016-04-01 DIAGNOSIS — E1142 Type 2 diabetes mellitus with diabetic polyneuropathy: Secondary | ICD-10-CM | POA: Diagnosis not present

## 2016-04-01 DIAGNOSIS — E1149 Type 2 diabetes mellitus with other diabetic neurological complication: Secondary | ICD-10-CM | POA: Diagnosis not present

## 2016-04-01 DIAGNOSIS — N183 Chronic kidney disease, stage 3 (moderate): Secondary | ICD-10-CM | POA: Diagnosis not present

## 2016-04-01 DIAGNOSIS — I10 Essential (primary) hypertension: Secondary | ICD-10-CM | POA: Diagnosis not present

## 2016-04-01 DIAGNOSIS — E782 Mixed hyperlipidemia: Secondary | ICD-10-CM | POA: Diagnosis not present

## 2016-04-10 DIAGNOSIS — E876 Hypokalemia: Secondary | ICD-10-CM | POA: Diagnosis not present

## 2016-04-10 DIAGNOSIS — E875 Hyperkalemia: Secondary | ICD-10-CM | POA: Diagnosis not present

## 2016-05-23 ENCOUNTER — Other Ambulatory Visit: Payer: Self-pay | Admitting: Cardiovascular Disease

## 2016-06-17 ENCOUNTER — Encounter: Payer: Self-pay | Admitting: Cardiovascular Disease

## 2016-06-17 ENCOUNTER — Ambulatory Visit (INDEPENDENT_AMBULATORY_CARE_PROVIDER_SITE_OTHER): Payer: Medicare Other | Admitting: Cardiovascular Disease

## 2016-06-17 VITALS — BP 120/60 | HR 60 | Ht 66.0 in | Wt 175.0 lb

## 2016-06-17 DIAGNOSIS — I2510 Atherosclerotic heart disease of native coronary artery without angina pectoris: Secondary | ICD-10-CM | POA: Diagnosis not present

## 2016-06-17 DIAGNOSIS — I482 Chronic atrial fibrillation, unspecified: Secondary | ICD-10-CM

## 2016-06-17 DIAGNOSIS — I10 Essential (primary) hypertension: Secondary | ICD-10-CM | POA: Diagnosis not present

## 2016-06-17 NOTE — Patient Instructions (Signed)

## 2016-06-17 NOTE — Progress Notes (Signed)
Gabriel Gutierrez Date of Birth  1936/01/16 [facility] [address]    [place], [place]  [address] [phone]  Fax  [phone]  Problem list: 1. Coronary artery disease-status post CABG 2. Abdominal aortic aneurysm-Status post stent grafting 3. Diabetes mellitus 4. Dyslipidemia 5. Hypertension 6. Atrial fibrillation -  We have tried cardioversion in the past but it lasted only 1 week.  7. History of stroke    Gabriel Gutierrez is a 81 yo gentleman with a history of coronary artery disease-status post coronary artery bypass grafting in 2007. He has a history of hypertension.   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    He was recently admitted to the hospital with an acute stroke. He was also found to have rapid atrial fibrillation. He stroke symptoms resolve fairly quickly and he does not have much of the deficit. He remains in atrial fibrillation.  He's been maintained on Xarelto.  He's completely asymptomatic from a cardiac standpoint. He cannot tell that his heart rate is beating irregularly.  Dec. 19, 2013 He had a cardioversion performed last month by Dr. Irish Lack who was kind enough to perform the cardioversion after I got tied up in the office. He is completely asymptomatic. He cannot tell whether his heart is in atrial fibrillation or normal sinus rhythm. He has been able to do all of his normal activities without difficulty.  August 10, 2012:  Gabriel Gutierrez is doing well.  His potassium levels have been running high. He had blood checked at his medical doctors office in February and his potassium level was 5.8. He discontinued the benazepril / HCT. Recheck potassium was even higher. He had it checked again this week with results are pending.  He is cutting out all high potassium foods.   He had a cardioversion in   Dec. 11, 2014:  Gabriel Gutierrez is doing  well.  Active , works hard every day, no CP.  No dyspnea.  Sleeping well at night.  Completely asymptomatic from an a-fib standpoint.   October 27, 2013: Gabriel Gutierrez is doing ok.    Not able to do as much as he would like.  Still very active - plays golf,  Walking some .  Has some knee problems that limits him.    Dec. 7, 2015: Gabriel Gutierrez is seen today for follow up of his CAD, AAA repair, atrial fib., and dyslipidemia.  He also has a hx of DM and a CVA.   November 08, 2014:  Doing well from a cardiac standpoint. Trouble with knees.  Has had a stroke - on xarelto .  Had an occular stroke  Jan. 23. 2017 Gabriel Gutierrez is doing well from a cardiac standpoint. Needs partial replacement of the left knee Doing great.  No CP,  Has some DOE with significant exertion. No DOE doing regular chores  Jan. 29, 2018: No cardiac issues over the past year.  Has some left temple pain .  BP and HR are normal     Current Outpatient Prescriptions on File Prior to Visit  Medication Sig Dispense Refill  . albuterol (PROVENTIL HFA;VENTOLIN HFA) 108 (90 Base) MCG/ACT inhaler Inhale 1 puff into the lungs every 6 (six) hours as needed for wheezing or shortness of breath.    [name] atorvastatin (LIPITOR) 80 MG tablet TAKE 1 TABLET DAILY (Patient taking differently: Take  80 mg by mouth every evening. TAKE 1 TABLET DAILY) 90 tablet 1  . diltiazem (CARDIZEM CD) 120 MG 24 hr capsule Take 1 capsule (120 mg total) by mouth daily. 90 capsule 1  . L-Methylfolate-B12-B6-B2 (CEREFOLIN) 10-18-48-5 MG TABS Take 1 capsule by mouth 1 day or 1 dose. 30 each 3  . metFORMIN (GLUCOPHAGE) 500 MG tablet Take 500 mg by mouth 2 (two) times daily with a meal.      . Methylfol-Methylcob-Acetylcyst (METAFOLBIC PLUS) 6-2-600 MG TABS TAKE 1 TABLET EVERY MORNING. 90 tablet 0  . metoprolol tartrate (LOPRESSOR) 25 MG tablet Take 25 mg by mouth daily.    . rivaroxaban (XARELTO) 20 MG TABS tablet Take 1 tablet (20 mg total) by mouth daily with supper. 90 tablet 2  .  tamsulosin (FLOMAX) 0.4 MG CAPS capsule Take 0.4 mg by mouth daily.      No current facility-administered medications on file prior to visit.     No Known Allergies  Past Medical History:  Diagnosis Date  . AAA (abdominal aortic aneurysm) ([place])   . Arthritis   . Atrial fibrillation ([place])   . Coronary artery disease    post CABG x5 --Severe three-vessel coronary disease  . Diabetes mellitus Age 81  . Dysrhythmia   . Hyperlipidemia   . Hypertension   . Irregular heart beat  Feb 18, 2012  . Renal artery stenosis ([place])   . Stroke [facility]) Oct. 1, 2013   Mini stroke- Afib    Past Surgical History:  Procedure Laterality Date  . CARDIAC CATHETERIZATION  08/26/2005   Est. EF of 65-70% -- Severe three-vessel coronary artery disease -- very heavily calcified vessels and really there is no role for angioplasty  -- he heavily calcified vessels and really there is no role for angioplasty abdominal aortic aneurysm surgery  . CARDIOVERSION  04/15/2012   Procedure: CARDIOVERSION;  Surgeon: Thayer Headings, MD;  Location: [place];  Service: Cardiovascular;  Laterality: N/A;  . CATARACT EXTRACTION     left eye  . COLON SURGERY     ruptured colon  . CORONARY ARTERY BYPASS GRAFT  09/02/2005   x 5 -- left internal mammary artery graft to the left anterior descending coronary artery, with a saphenous vein graft to the diagonal branch of the LAD, a sequential saphenous vein graft to the second and fourth obtuse marginal branches of the left circumflex coronary artery, and a saphenous vein graft to the posterior descending branch of RCA  -- Endoscopic vein harvesting from the left leg  . EYE SURGERY Left    Cataract  . EYE SURGERY Right    Cataract  . HAND SURGERY  1997  . LAPAROTOMY  1998  . MEDIAN STERNOTOMY    . PARTIAL KNEE ARTHROPLASTY Left 07/17/2015   Procedure: UNICOMPARTMENTAL KNEE;  Surgeon: Vickey Huger, MD;  Location: [facility];  Service: Orthopedics;  Laterality: Left;  . PR VEIN BYPASS  GRAFT,AORTO-FEM-POP  12/23/2005   Infrarenal abdominal aortic aneurysm, right common iliac occlusive disease --   . SKIN GRAFT  1992    History  Smoking Status  . Former Smoker  . Packs/day: 2.00  . Years: 44.00  . Types: Cigarettes  . Quit date: 05/20/1988  Smokeless Tobacco  . Former Systems developer  . Quit date: 02/11/2003    History  Alcohol Use No    Family History  Problem Relation Age of Onset  . Coronary artery disease Mother   . Heart disease Mother  After age 59-Carotid  . Hyperlipidemia Mother   . Stroke Mother   . Coronary artery disease Father   . Hyperlipidemia Father   . Hypertension Father   . Varicose Veins Father   . Heart attack Father   . Coronary artery disease Sister   . Heart disease Sister     After age 55- BP  . Heart attack Sister   . Diabetes Brother   . Heart disease Brother     After 43 yrs of age  . Heart attack Brother   . Hyperlipidemia Brother   . Alzheimer's disease Sister   . Seizures Sister     Reviw of Systems:  Reviewed in the HPI.  All other systems are negative.  Physical Exam: BP 120/60 (BP Location: Left Arm, Patient Position: Sitting, Cuff Size: Normal)   Pulse 60   Ht 5\' 6"  (1.676 m)   Wt 175 lb (79.4 kg)   BMI 28.25 kg/m  The patient is alert and oriented x 3.  The mood and affect are normal.   Skin: warm and dry.  Color is normal.    HEENT:   [place]/AT, no JVD, normal carotids Lungs: clear  Heart: irregularly irregular, no murmurs   Abdomen: good BS, non tender Extremities:  No  C/c/e Neuro:  Non focal    ECG: Jan. 29, 2018:  Atrial fib with slow vent response - 61    Assessment / Plan:   1. Coronary artery disease-status post CABG - Gabriel Gutierrez is doing well. Is not having any angina. Continue current medications. 2. Abdominal aortic aneurysm-Status post stent grafting 3. Diabetes mellitus 4. Dyslipidemia - Will check fasting labs today.  5. Hypertension - BP is now better  6. Atrial fibrillation -   On chronic  xarelto.   7. History of stroke   Mertie Moores, MD  06/17/2016 10:38 AM    [facility] [address],  [place], [place]  [address] Pager [phone] Phone: [phone]; Fax: [phone]

## 2016-06-18 LAB — COMPREHENSIVE METABOLIC PANEL
ALBUMIN: 4.4 g/dL (ref 3.5–4.8)
ALK PHOS: 90 IU/L (ref 39–117)
ALT: 14 IU/L (ref 0–44)
AST: 19 IU/L (ref 0–40)
Albumin/Globulin Ratio: 2.3 — ABNORMAL HIGH (ref 1.2–2.2)
BUN / CREAT RATIO: 17 (ref 10–24)
BUN: 24 mg/dL (ref 8–27)
Bilirubin Total: 0.4 mg/dL (ref 0.0–1.2)
CO2: 23 mmol/L (ref 18–29)
CREATININE: 1.44 mg/dL — AB (ref 0.76–1.27)
Calcium: 8.9 mg/dL (ref 8.6–10.2)
Chloride: 103 mmol/L (ref 96–106)
GFR calc non Af Amer: 46 mL/min/{1.73_m2} — ABNORMAL LOW (ref >59)
GFR, EST AFRICAN AMERICAN: 53 mL/min/{1.73_m2} — AB (ref >59)
GLOBULIN, TOTAL: 1.9 g/dL (ref 1.5–4.5)
GLUCOSE: 119 mg/dL — AB (ref 65–99)
Potassium: 4.9 mmol/L (ref 3.5–5.2)
SODIUM: 141 mmol/L (ref 134–144)
TOTAL PROTEIN: 6.3 g/dL (ref 6.0–8.5)

## 2016-06-18 LAB — LIPID PANEL
CHOLESTEROL TOTAL: 149 mg/dL (ref 100–199)
Chol/HDL Ratio: 3.4 ratio units (ref 0.0–5.0)
HDL: 44 mg/dL (ref >39)
LDL Calculated: 79 mg/dL (ref 0–99)
Triglycerides: 129 mg/dL (ref 0–149)
VLDL CHOLESTEROL CAL: 26 mg/dL (ref 5–40)

## 2016-07-09 ENCOUNTER — Other Ambulatory Visit: Payer: Self-pay | Admitting: *Deleted

## 2016-07-09 ENCOUNTER — Other Ambulatory Visit: Payer: Self-pay | Admitting: Cardiovascular Disease

## 2016-07-09 ENCOUNTER — Telehealth: Payer: Self-pay | Admitting: Nurse Practitioner

## 2016-07-09 DIAGNOSIS — I4891 Unspecified atrial fibrillation: Secondary | ICD-10-CM

## 2016-07-09 MED ORDER — RIVAROXABAN 15 MG PO TABS: 15.0000 mg | ORAL_TABLET | Freq: Every day | ORAL | 1 refills | Status: DC

## 2016-07-09 NOTE — Telephone Encounter (Signed)
Spoke with pt's wife and instructed her that pt's needs to change his dose of Xarelto 20mg  to Xarelto 15mg  due to lab results. Explained that he needs start the meds today if able to pick up & she verbalized understanding.

## 2016-07-09 NOTE — Telephone Encounter (Signed)
Spoke with Candance, RN from [facility] who advised patient's Xarelto is up for refill. She states he has been taking Xarelto 20 mg. Candance states patient's current lab work indicates CrCl = 47.48 ml/min while CrCl 1 year ago was 52.97/ml/min indicating a change to Xarelto 15 mg. She would like Dr. Elmarie Shiley agreement to switch patient from Xarelto 20 mg to Xarelto 15 mg.

## 2016-07-09 NOTE — Telephone Encounter (Signed)
Spoke with pt's wife (pt was out playing golf) & gave her detailed instructions regarding changing Xarelto dose to Xarelto 15mg  daily. Also, we will repeat labs in 1 month per protocol once pt has been taking Xarelto 15mg  daily.

## 2016-07-09 NOTE — Telephone Encounter (Signed)
Agree with plan to switch to Xarelto 15 mg a day Given recent decline of his renal function

## 2016-07-22 DIAGNOSIS — J028 Acute pharyngitis due to other specified organisms: Secondary | ICD-10-CM | POA: Diagnosis not present

## 2016-07-26 ENCOUNTER — Encounter: Payer: Self-pay | Admitting: Family

## 2016-07-26 IMAGING — CT CT KNEE*L* W/O CM
1 of 6 series · 4 of 14 positions shown, 5 images · non-contrast
Comparison: None.

CLINICAL DATA: Preop left knee arthroplasty

EXAM:
CT OF THE LEFT HIP, KNEE AND ANKLE WITHOUT CONTRAST
TECHNIQUE: Multidetector CT imaging of the left hip, knee and ankle was
performed according to the standard protocol. Multiplanar CT image
reconstructions of the knee were also generated.

[Series 4: knee bone · axial · 0.34mm/px · z∈[-599,-426]mm · 4 of 460 slices shown, 5 images]
[im 92/460  soft-tissue]
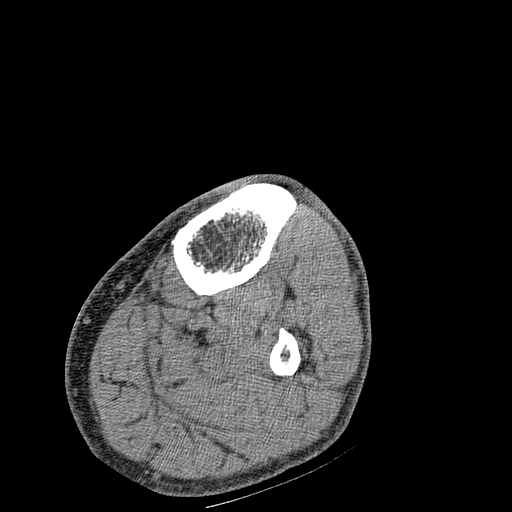
[im 92/460  bone]
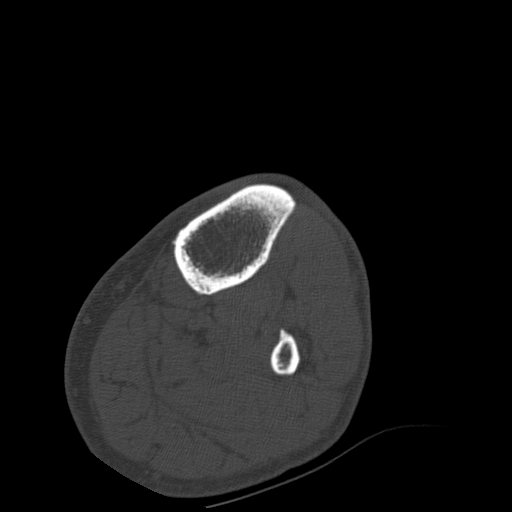
[im 184/460  bone]
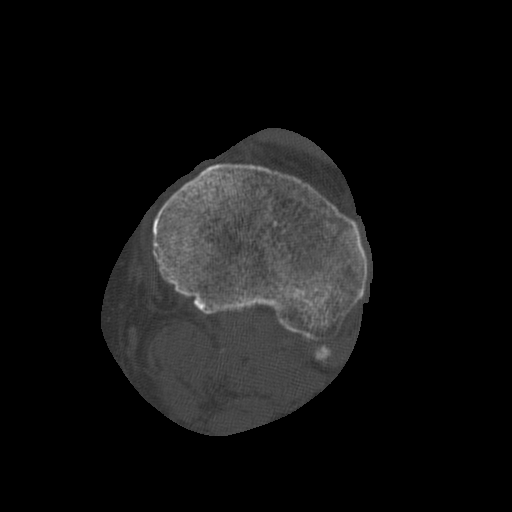
[im 276/460  bone]
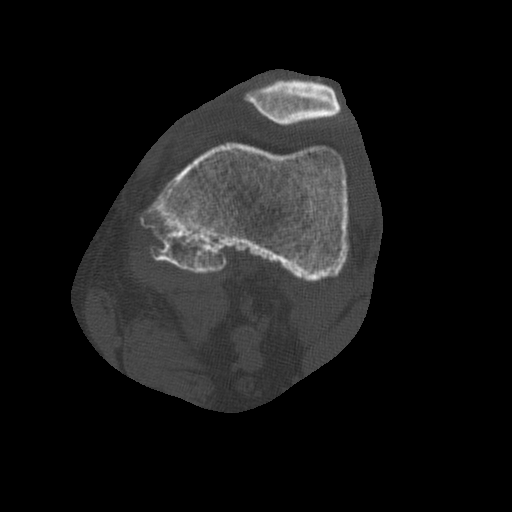
[im 368/460  bone]
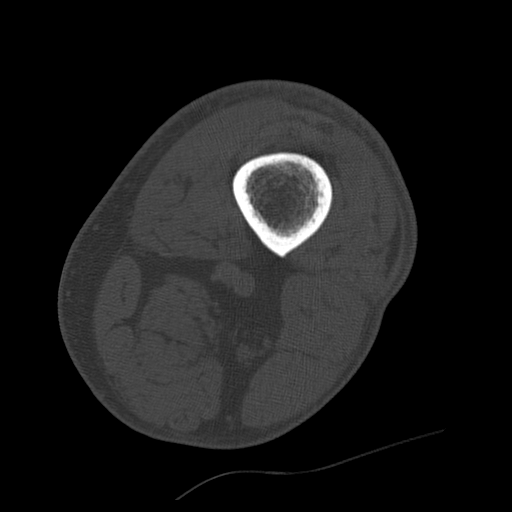

[4 of 14 positions shown; findings below may reference images not displayed]

FINDINGS: The hip demonstrates no fracture or dislocation. There is no lytic
or blastic lesion. There is a fat containing left inguinal hernia.

The knee demonstrates no fracture or dislocation. There is no lytic
or blastic lesion. There are tricompartmental osteoarthritic changes
of the left knee most severe in the medial femorotibial compartment
with severe joint space narrowing with a bone-on-bone appearance,
subchondral sclerosis and marginal osteophytosis. There is a small
joint effusion. There is a moderate size Baker cyst. There is a
small loose body in the popliteus tendon sheath at the
musculotendinous junction.

The ankle demonstrates no fracture or dislocation. There is no lytic
or blastic lesion.

There is peripheral vascular atherosclerotic disease.
IMPRESSION: 1. Tricompartmental osteoarthritis of the left knee most severe in
the medial femorotibial compartment.

## 2016-07-29 DIAGNOSIS — J441 Chronic obstructive pulmonary disease with (acute) exacerbation: Secondary | ICD-10-CM | POA: Diagnosis not present

## 2016-07-29 DIAGNOSIS — I10 Essential (primary) hypertension: Secondary | ICD-10-CM | POA: Diagnosis not present

## 2016-07-29 DIAGNOSIS — E782 Mixed hyperlipidemia: Secondary | ICD-10-CM | POA: Diagnosis not present

## 2016-07-29 DIAGNOSIS — N183 Chronic kidney disease, stage 3 (moderate): Secondary | ICD-10-CM | POA: Diagnosis not present

## 2016-07-29 DIAGNOSIS — N401 Enlarged prostate with lower urinary tract symptoms: Secondary | ICD-10-CM | POA: Diagnosis not present

## 2016-07-29 DIAGNOSIS — I63012 Cerebral infarction due to thrombosis of left vertebral artery: Secondary | ICD-10-CM | POA: Diagnosis not present

## 2016-07-29 DIAGNOSIS — E1140 Type 2 diabetes mellitus with diabetic neuropathy, unspecified: Secondary | ICD-10-CM | POA: Diagnosis not present

## 2016-07-29 DIAGNOSIS — I4891 Unspecified atrial fibrillation: Secondary | ICD-10-CM | POA: Diagnosis not present

## 2016-07-29 DIAGNOSIS — E1142 Type 2 diabetes mellitus with diabetic polyneuropathy: Secondary | ICD-10-CM | POA: Diagnosis not present

## 2016-07-29 DIAGNOSIS — E784 Other hyperlipidemia: Secondary | ICD-10-CM | POA: Diagnosis not present

## 2016-07-29 DIAGNOSIS — E1129 Type 2 diabetes mellitus with other diabetic kidney complication: Secondary | ICD-10-CM | POA: Diagnosis not present

## 2016-08-06 ENCOUNTER — Ambulatory Visit (INDEPENDENT_AMBULATORY_CARE_PROVIDER_SITE_OTHER)
Admission: RE | Admit: 2016-08-06 | Discharge: 2016-08-06 | Disposition: A | Discharge status: Home / Self Care | Payer: Medicare Other | Source: Ambulatory Visit | Attending: Family | Admitting: Family

## 2016-08-06 ENCOUNTER — Other Ambulatory Visit: Payer: Medicare Other | Admitting: *Deleted

## 2016-08-06 ENCOUNTER — Other Ambulatory Visit: Payer: Medicare Other

## 2016-08-06 ENCOUNTER — Ambulatory Visit (INDEPENDENT_AMBULATORY_CARE_PROVIDER_SITE_OTHER): Payer: Medicare Other | Admitting: Family

## 2016-08-06 ENCOUNTER — Encounter: Payer: Self-pay | Admitting: Family

## 2016-08-06 ENCOUNTER — Ambulatory Visit (HOSPITAL_COMMUNITY)
Admission: RE | Admit: 2016-08-06 | Discharge: 2016-08-06 | Disposition: A | Discharge status: Home / Self Care | Payer: Medicare Other | Source: Ambulatory Visit | Attending: Family | Admitting: Family

## 2016-08-06 VITALS — BP 146/80 | HR 98 | Temp 97.1°F | Resp 20 | Ht 66.0 in | Wt 172.6 lb

## 2016-08-06 DIAGNOSIS — Z8673 Personal history of transient ischemic attack (TIA), and cerebral infarction without residual deficits: Secondary | ICD-10-CM

## 2016-08-06 DIAGNOSIS — Z95828 Presence of other vascular implants and grafts: Secondary | ICD-10-CM | POA: Insufficient documentation

## 2016-08-06 DIAGNOSIS — I701 Atherosclerosis of renal artery: Secondary | ICD-10-CM

## 2016-08-06 DIAGNOSIS — I6523 Occlusion and stenosis of bilateral carotid arteries: Secondary | ICD-10-CM | POA: Diagnosis not present

## 2016-08-06 DIAGNOSIS — I714 Abdominal aortic aneurysm, without rupture, unspecified: Secondary | ICD-10-CM

## 2016-08-06 DIAGNOSIS — I4891 Unspecified atrial fibrillation: Secondary | ICD-10-CM | POA: Diagnosis not present

## 2016-08-06 DIAGNOSIS — Z9889 Other specified postprocedural states: Secondary | ICD-10-CM | POA: Insufficient documentation

## 2016-08-06 LAB — VAS US CAROTID
LCCADDIAS: -19 cm/s
LEFT ECA DIAS: -17 cm/s
LEFT VERTEBRAL DIAS: 12 cm/s
LICADDIAS: -29 cm/s
LICADSYS: -105 cm/s
Left CCA dist sys: -117 cm/s
Left CCA prox dias: 12 cm/s
Left CCA prox sys: 107 cm/s
RCCAPSYS: 105 cm/s
RIGHT CCA MID DIAS: -14 cm/s
RIGHT ECA DIAS: -8 cm/s
RIGHT VERTEBRAL DIAS: 18 cm/s
Right CCA prox dias: 11 cm/s
Right cca dist sys: -101 cm/s

## 2016-08-06 NOTE — Progress Notes (Signed)
[facility] [place]  CC: Follow up s/p EVAR  History of Present Illness  Gabriel Gutierrez is a 81 y.o. (05/04/1936) male patient of Dr. Kellie Simmering who presents with chief complaint: follow up on renal artery stenosis and s/p aortobiiliac [name] Zenith stent graft placed by Dr. Kellie Simmering in 2007 for abdominal aortic aneurysm. He also returns today for carotid artery Duplex. He had a stroke October, 2013, has a-fib. for which he is taking Jennye Moccasin, now prescribed by Dr. Acie Fredrickson, his cardiologist.  His left side weakness has resolved.  He quit smoking in 1990.  He does have DM, well controlled, states his last A1C was 6.4 on 07-29-16 (pt brought lab results with him from Dr. Reinaldo Meeker, his PCP).   He denies back or abdominal pain.  The patient's blood pressure has been stable. Pt states his blood pressure at home is 120-135/70's. The patient's blood pressure medication regimen has remained stable. The patient's urinary history has remained stable, he does seem to have BPH, Flomax helps.   He had a partial left knee replacement in March 2017. He does not have claudication symptoms with walking, denies non healing wounds.  He attributes his higher blood pressure to drinking coffee before his blood pressure was checked and not taking his blood pressure medication this morning yet.  He states he was treated recently for the flu with respiratory component, using an inhaler, states his wheezing is improving.   Pt smoker: former smoker, quit in 1990  Pt meds include: Statin :Yes Betablocker: Yes ASA: No Other anticoagulants/antiplatelets: Xaralto for atrial fib     Past Medical History:  Diagnosis Date  . AAA (abdominal aortic aneurysm) ([place])   . Arthritis   . Atrial fibrillation ([place])   . Coronary artery disease    post CABG x5 --Severe three-vessel coronary disease  . Diabetes mellitus Age 63  . Dysrhythmia   . Hyperlipidemia   . Hypertension   . Irregular  heart beat  Feb 18, 2012  . Renal artery stenosis ([place])   . Stroke [facility]) Oct. 1, 2013   Mini stroke- Afib   Past Surgical History:  Procedure Laterality Date  . CARDIAC CATHETERIZATION  08/26/2005   Est. EF of 65-70% -- Severe three-vessel coronary artery disease -- very heavily calcified vessels and really there is no role for angioplasty  -- he heavily calcified vessels and really there is no role for angioplasty abdominal aortic aneurysm surgery  . CARDIOVERSION  04/15/2012   Procedure: CARDIOVERSION;  Surgeon: Thayer Headings, MD;  Location: [facility];  Service: Cardiovascular;  Laterality: N/A;  . CATARACT EXTRACTION     left eye  . COLON SURGERY     ruptured colon  . CORONARY ARTERY BYPASS GRAFT  09/02/2005   x 5 -- left internal mammary artery graft to the left anterior descending coronary artery, with a saphenous vein graft to the diagonal branch of the LAD, a sequential saphenous vein graft to the second and fourth obtuse marginal branches of the left circumflex coronary artery, and a saphenous vein graft to the posterior descending branch of RCA  -- Endoscopic vein harvesting from the left leg  . EYE SURGERY Left    Cataract  . EYE SURGERY Right    Cataract  . HAND SURGERY  1997  . LAPAROTOMY  1998  . MEDIAN STERNOTOMY    . PARTIAL KNEE ARTHROPLASTY Left 07/17/2015   Procedure: UNICOMPARTMENTAL KNEE;  Surgeon: Vickey Huger, MD;  Location: [facility];  Service: Orthopedics;  Laterality: Left;  . PR VEIN BYPASS GRAFT,AORTO-FEM-POP  12/23/2005   Infrarenal abdominal aortic aneurysm, right common iliac occlusive disease --   . SKIN GRAFT  1992   Social History Social History  Substance Use Topics  . Smoking status: Former Smoker    Packs/day: 2.00    Years: 44.00    Types: Cigarettes    Quit date: 05/20/1988  . Smokeless tobacco: Former Systems developer    Quit date: 02/11/2003  . Alcohol use No   Family History Family History  Problem Relation Age of Onset  . Coronary artery disease  Mother   . Heart disease Mother     After age 98-Carotid  . Hyperlipidemia Mother   . Stroke Mother   . Coronary artery disease Father   . Hyperlipidemia Father   . Hypertension Father   . Varicose Veins Father   . Heart attack Father   . Coronary artery disease Sister   . Heart disease Sister     After age 3- BP  . Heart attack Sister   . Diabetes Brother   . Heart disease Brother     After 59 yrs of age  . Heart attack Brother   . Hyperlipidemia Brother   . Alzheimer's disease Sister   . Seizures Sister    Current Outpatient Prescriptions on File Prior to Visit  Medication Sig Dispense Refill  . albuterol (PROVENTIL HFA;VENTOLIN HFA) 108 (90 Base) MCG/ACT inhaler Inhale 1 puff into the lungs every 6 (six) hours as needed for wheezing or shortness of breath.    [name] atorvastatin (LIPITOR) 80 MG tablet TAKE 1 TABLET DAILY (Patient taking differently: Take 80 mg by mouth every evening. TAKE 1 TABLET DAILY) 90 tablet 1  . diltiazem (CARDIZEM CD) 120 MG 24 hr capsule Take 1 capsule (120 mg total) by mouth daily. 90 capsule 1  . L-Methylfolate-B12-B6-B2 (CEREFOLIN) 10-18-48-5 MG TABS Take 1 capsule by mouth 1 day or 1 dose. 30 each 3  . metFORMIN (GLUCOPHAGE) 500 MG tablet Take 500 mg by mouth 2 (two) times daily with a meal.      . Methylfol-Methylcob-Acetylcyst (METAFOLBIC PLUS) 6-2-600 MG TABS TAKE 1 TABLET EVERY MORNING. 90 tablet 0  . metoprolol tartrate (LOPRESSOR) 25 MG tablet Take 25 mg by mouth daily.    . Rivaroxaban (XARELTO) 15 MG TABS tablet Take 1 tablet (15 mg total) by mouth daily with supper. 30 tablet 1  . tamsulosin (FLOMAX) 0.4 MG CAPS capsule Take 0.4 mg by mouth daily.      No current facility-administered medications on file prior to visit.    No Known Allergies   ROS: See HPI for pertinent positives and negatives.  Physical Examination  Vitals:   08/06/16 1010  BP: (!) 146/80  Pulse: 98  Resp: 20  Temp: 97.1 F (36.2 C)  TempSrc: Oral  SpO2: 98%   Weight: 172 lb 9.6 oz (78.3 kg)  Height: 5\' 6"  (1.676 m)   Body mass index is 27.86 kg/m.  General: A&O x 3, WD.  Pulmonary: Sym exp, decreased air movt in right posterior fields with few expiratory wheezes, no rales or rhonchi.  Cardiac: Irregular rhythm, controlled rate, no peripheral edema.  Vascular:  Vessel  Right  Left   Radial  Palpable  Palpable   Carotid  audible, without bruit  audible, without bruit   Aorta  Not palpable  N/A   Femoral  Palpable  Palpable   Popliteal  Not palpable  Not palpable  PT  Palpable  Palpable   DP  Palpable  Palpable    Gastrointestinal: soft, NTND, -G/R, - HSM, + mid abdominal incisional hernia that is reducible, - CVAT B.  Musculoskeletal: M/S 5/5 throughout, Extremities without ischemic changes.  Neurologic: Pain and light touch intact in extremities, Motor exam as listed above.  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 Per the operative report, this is a Producer, television/film/video stent graft. The treated aortic aneurysm continues to decrease in size measuring 30 x 22 mm in maximal diameter compared to 33 x 22 mm on the prior study. There is no evidence of endoleak on the arterial phase, or delayed phase images. Grafts limbs remain patent and in unchanged position.  Celiac: Widely patent. Conventional hepatic arterial anatomy.  SMA: Trace atherosclerotic disease at the origin without significant stenosis.  Renals: Single renal arteries bilaterally. There is at least moderate stenosis secondary to noncalcified atherosclerotic plaque at the  origin of the left renal artery which has progressed since the prior study. There is mild calcified plaque at the origin of the right renal artery without a significant underlying stenosis.  IMA: Excluded by the stent graft. The distal branches opacify via retrograde flow. No type 2 endoleak.  Inflow: Scattered atherosclerotic vascular calcification without significant stenosis, dissection, or occlusion. Aneurysmal dilatation of the proximal right hypogastric artery to 13 mm has progressed slightly from 10 mm on the prior study. There is a moderate amount of calcified plaque along the posterior wall of the left common femoral artery.  Proximal Outflow: The visualized portions of the bilateral superficial profunda femoris arteries are patent.  Veins: Given the limitations of non venous phase timing, no focal abnormality detected.  NON-VASCULAR  Lower Chest: Visualized heart within normal limits for size. No pericardial effusion. Calcification versus stent in the right coronary artery. Trace calcification of the mitral valve annulus. The distal thoracic esophagus is within normal limits. Lung bases are clear.  Stable 1 cm right renal cyst. No hydronephrosis. No solid renal lesion. Developing renal size discrepancy. The left kidney is now slightly smaller than the right kidney. Comparing to the prior study, the left kidney has decreased in size measuring 10.3 cm in length today compared to 10.8 cm on the prior study.   IMPRESSION:  1. Stable appearance of successfully treated infrarenal abdominal aortic aneurysm status post EVAR with a Cook Zenith bifurcated endoprosthesis. The excluded aneurysm sac measures slightly smaller in size compared to the prior study. There is no evidence of endoleak.  2. Developing moderately severe ostial stenosis of the left renal artery. Additionally, there is been an interval 0.5 cm decrease in size of the left  kidney since the prior study suggesting that this stenosis may be hemodynamically significant.  3. Mild interval progression in the size of the right internal  iliac artery aneurysm which now measures up to 13 mm compared to 10 mm on the prior study.    Medical Decision Making  Gabriel Gutierrez is a 81 y.o. male who is followed for renal artery stenosis and is s/p aortobiiliac Cook Zenith stent graft placed by Dr. Kellie Simmering in 2007 for abdominal aortic aneurysm. He also has a history of  minimal carotid artery stenosis and atrial fib. His blood pressure remains in good control, he has no abdominal or back pain.  He had a stroke October, 2013, has a-fib. for which he is taking Jennye Moccasin, now prescribed by Dr. Acie Fredrickson, his cardiologist.  His left side weakness has resolved.  Face to face time with patient was 25 minutes. Over 50% of this time was spent on counseling and coordination of care.   DATA Today's carotid duplex suggests less than 40% bilateral internal carotid artery stenosis which may be underestimated due to calcific plaque.  Bilateral vertebral artery flow is antegrade.  Bilateral subclavian artery waveforms are normal.  No change since exam of 08-08-15.  Today's renal artery duplex suggests less than 60% right renal artery stenosis (128 cm/s),  and greater than 60% left renal artery stenosis (249 cm/s). Technically difficult exam due to bowel gas, scar tissue, and body habitus.  Right kidney size: 11.4 cm; Left kidney size: 11.0 cm. Right renal cyst: 1.1 cm x 1.1 cm No change since exam of 08-08-15.  Today's EVAR duplex: Sub optimal exam due to bowel gas, scar tissue, and body habitus. 4.4 cm AP sac size, based on limited visualization. (4.4 cm on 08-08-15). Right CIA: 1.2 cm; Left CIA: 1.2 cm  I discussed with Dr. Donnetta Hutching the 4.4 cm AP sac size on duplex today considering that the duplex exam was sub optimal due to bowel gas and scar tissue, compared to CTA abd/pelvis on  02/11/12 which demonstrated the treated aortic aneurysm continues to decrease in size measuring 3.0 x 2.2 cm in maximal diameter; also discussed renal artery stenosis with near normal blood pressure, no stent placed; see Plan.   The next CTA abd/pelvis to evaluate EVAR and bilateral renal arteries will be scheduled for 12 months.  The patient will follow up with me in 12 months with these studies.  I emphasized the importance of maximal medical management including strict control of blood pressure, blood glucose, and lipid levels, antiplatelet agents, obtaining regular exercise, and cessation of smoking.   Thank you for allowing [place] to participate in this patient's care.  Clemon Chambers, RN, MSN, FNP-C [facility] Office: [facility] Physician: Early  08/06/2016, 10:24 AM

## 2016-08-06 NOTE — Patient Instructions (Signed)
Abdominal Aortic Aneurysm Blood pumps away from the heart through tubes (blood vessels) called arteries. Aneurysms are weak or damaged places in the wall of an artery. It bulges out like a balloon. An abdominal aortic aneurysm happens in the main artery of the body (aorta). It can burst or tear, causing bleeding inside the body. This is an emergency. It needs treatment right away. What are the causes? The exact cause is unknown. Things that could cause this problem include:  Fat and other substances building up in the lining of a tube.  Swelling of the walls of a blood vessel.  Certain tissue diseases.  Belly (abdominal) trauma.  An infection in the main artery of the body. What increases the risk? There are things that make it more likely for you to have an aneurysm. These include:  Being over the age of 79 years old.  Having high blood pressure (hypertension).  Being a male.  Being white.  Being very overweight (obese).  Having a family history of aneurysm.  Using tobacco products. What are the signs or symptoms? Symptoms depend on the size of the aneurysm and how fast it grows. There may not be symptoms. If symptoms occur, they can include:  Pain (belly, side, lower back, or groin).  Feeling full after eating a small amount of food.  Feeling sick to your stomach (nauseous), throwing up (vomiting), or both.  Feeling a lump in your belly that feels like it is beating (pulsating).  Feeling like you will pass out (faint). How is this treated?  Medicine to control blood pressure and pain.  Imaging tests to see if the aneurysm gets bigger.  Surgery. How is this prevented? To lessen your chance of getting this condition:  Stop smoking. Stop chewing tobacco.  Limit or avoid alcohol.  Keep your blood pressure, blood sugar, and cholesterol within normal limits.  Eat less salt.  Eat foods low in saturated fats and cholesterol. These are found in animal and whole  dairy products.  Eat more fiber. Fiber is found in whole grains, vegetables, and fruits.  Keep a healthy weight.  Stay active and exercise often. This information is not intended to replace advice given to you by your health care provider. Make sure you discuss any questions you have with your health care provider. Document Released: 08/31/2012 Document Revised: 10/12/2015 Document Reviewed: 06/05/2012 Elsevier Interactive Patient Education  2017 [employer].     Renal Artery Stenosis Renal artery stenosis (RAS) is narrowing of the artery that carries blood to your kidneys. It can affect one or both kidneys. Your kidneys filter waste and extra fluid from your blood. You get rid of the waste and fluid when you urinate. Your kidneys also make an important chemical messenger (hormone) called renin. Renin helps regulate your blood pressure. The first sign of RAS may be high blood pressure. Over time, other symptoms can develop. What are the causes? Plaque buildup in your arteries (atherosclerosis) is the main cause of RAS. The plaques that cause this are made up of:  Fat.  Cholesterol.  Calcium.  Other substances. As these substances build up in your renal artery, this slows the blood supply to your kidneys. The lack of blood and oxygen causes the signs and symptoms of RAS. A much less common cause of RAS is a disease called fibromuscular dysplasia. This disease causes abnormal cell growth that narrows the renal artery. It is not related to atherosclerosis. It occurs mostly in women who are 79-68 years old.  It may be passed down through families. What increases the risk? You may be at risk for renal artery stenosis if you:  Are a man who is at least 79 years old.  Are a woman who is at least 79 years old.  Have high blood pressure.  Have high cholesterol.  Are a smoker.  Abuse alcohol.  Have diabetes or prediabetes.  Are overweight.  Have a family history of early heart  disease. What are the signs or symptoms? RAS usually develops slowly. You may not have any signs or symptoms at first. The earliest signs may be:  Developing high blood pressure.  A sudden increase in existing high blood pressure.  No longer responding to medicine that used to control your blood pressure. Later signs and symptoms are due to kidney damage. They may include:  Fatigue.  Shortness of breath.  Swollen legs and feet.  Dry skin.  Headaches.  Muscle cramps.  Loss of appetite.  Nausea or vomiting. How is this diagnosed? Your health care provider may suspect RAS based on changes in your blood pressure and your risk factors. A physical exam will be done. Your health care provider may use a stethoscope to listen for a whooshing sound (bruit) that can occur where the renal artery is blocking blood flow. Several tests may be done to confirm a diagnosis of RAS. These may include:  Blood and urine tests to check your kidney function.  Imaging tests of your kidneys, such as:  A test that involves using sound waves to create an image of your kidneys and the blood flow to your kidneys (ultrasound).  A test in which dye is injected into one of your blood vessels so images can be taken as the dye flows through your renal arteries (angiogram). These tests can be done using X-rays, a CT scan (computed tomography angiogram, CTA), or a type of MRI (magnetic resonance angiogram, MRA). How is this treated? Making lifestyle changes to reduce your risk factors is the first treatment option for early RAS. If the blood flow to one of your kidneys is cut by more than half, you may need medicine to:  Lower your blood pressure. This is the main medical treatment for RAS. You may need more than one type of medicine for this. The two types that work best for RAS are:  ACE inhibitors.  Angiotensin receptor blockers.  Reduce fluid in the body (diuretics).  Lower your cholesterol  (statins). If medicine is not enough to control RAS, you may need surgery. This may involve:  Threading a tube with an inflatable balloon into the renal artery to force it open (angioplasty).  Removing plaque from inside the artery (endarterectomy). Follow these instructions at home:  Take medicines only as directed by your health care provider.  Make any lifestyle changes recommended by your health care provider. This may include:  Working with a dietitian to maintain a heart-healthy diet. This type of diet is low in saturated fat, salt, and added sugar.  Starting an exercise program as directed by your health care provider.  Maintaining a healthy weight.  Quitting smoking.  Not abusing alcohol.  Keep all follow-up visits as directed by your health care provider. This is important. Contact a health care provider if:  Your symptoms of RAS are not getting better.  Your symptoms are changing or getting worse. Get help right away if:  You have very bad pain in your back or abdomen.  You have blood in your urine.  This information is not intended to replace advice given to you by your health care provider. Make sure you discuss any questions you have with your health care provider. Document Released: 01/30/2005 Document Revised: 10/12/2015 Document Reviewed: 08/19/2013 Elsevier Interactive Patient Education  2017 [employer].

## 2016-08-07 ENCOUNTER — Telehealth: Payer: Self-pay | Admitting: Cardiovascular Disease

## 2016-08-07 ENCOUNTER — Telehealth: Payer: Self-pay | Admitting: Nurse Practitioner

## 2016-08-07 DIAGNOSIS — I482 Chronic atrial fibrillation, unspecified: Secondary | ICD-10-CM

## 2016-08-07 LAB — CBC
HEMATOCRIT: 44.1 % (ref 37.5–51.0)
HEMOGLOBIN: 14.5 g/dL (ref 13.0–17.7)
MCH: 28.8 pg (ref 26.6–33.0)
MCHC: 32.9 g/dL (ref 31.5–35.7)
MCV: 88 fL (ref 79–97)
PLATELETS: 327 10*3/uL (ref 150–379)
RBC: 5.04 x10E6/uL (ref 4.14–5.80)
RDW: 13.9 % (ref 12.3–15.4)
WBC: 9.7 10*3/uL (ref 3.4–10.8)

## 2016-08-07 LAB — BASIC METABOLIC PANEL
BUN/Creatinine Ratio: 17 (ref 10–24)
BUN: 21 mg/dL (ref 8–27)
CALCIUM: 10.1 mg/dL (ref 8.6–10.2)
CHLORIDE: 104 mmol/L (ref 96–106)
CO2: 23 mmol/L (ref 18–29)
Creatinine, Ser: 1.26 mg/dL (ref 0.76–1.27)
GFR calc Af Amer: 63 mL/min/{1.73_m2} (ref >59)
GFR calc non Af Amer: 54 mL/min/{1.73_m2} — ABNORMAL LOW (ref >59)
Glucose: 112 mg/dL — ABNORMAL HIGH (ref 65–99)
POTASSIUM: 5.2 mmol/L (ref 3.5–5.2)
Sodium: 147 mmol/L — ABNORMAL HIGH (ref 134–144)

## 2016-08-07 MED ORDER — RIVAROXABAN 15 MG PO TABS: 15.0000 mg | ORAL_TABLET | Freq: Every day | ORAL | 3 refills | Status: DC

## 2016-08-07 MED ORDER — APIXABAN 5 MG PO TABS: 5.0000 mg | ORAL_TABLET | Freq: Two times a day (BID) | ORAL | 3 refills | Status: DC

## 2016-08-07 NOTE — Telephone Encounter (Signed)
-----   Message from Thayer Headings, MD sent at 08/07/2016  8:22 AM EDT ----- WBC looks good  CMET is stable

## 2016-08-07 NOTE — Telephone Encounter (Signed)
I called patient earlier to report lab results and advised that I was sending Xarelto refills.  After receiving this message from Dr. Acie Fredrickson, I called and left message for patient to call back and that we are changing medications from Xarelto to Eliquis. Patient will need repeat lab appointment in 3 months.   I called [facility] and spoke with representative, Sam. He states he was able to stop the shipment of the Xarelto and that they will process order for Eliquis and get that shipped out to patient. I thanked him for his help.

## 2016-08-07 NOTE — Telephone Encounter (Signed)
Walk In pt form-Labs left from PCP placed in [place] box.

## 2016-08-07 NOTE — Telephone Encounter (Signed)
Lab results and plan to continue Xarelto 15 mg once daily reviewed with patient who verbalized understanding and agreement. He requests 90 day supply be sent to [facility] which I advised I have completed in the computer. He thanked me for the call.

## 2016-08-07 NOTE — Telephone Encounter (Signed)
-----   Message from Thayer Headings, MD sent at 08/07/2016  3:11 PM EDT ----- I thinks that might be a better option We can see if the cost is similar  Will send a message to [place] and will change him to Eliquis 5 mg BID  He should be OK to use up the Xarelto 15 mg tablets that he has and then start Eliquis  5 mg PO BID   We will check labs again on him in 3 months   ----- Message ----- From: Marcos Eke, RN Sent: 08/07/2016   2:18 PM To: Thayer Headings, MD  Greetings Dr. Acie Fredrickson,  Noticed that the pt had labs return on yesterday for Xarelto follow-up. His CrCl in Feb was 47.16ml/min and one year prior to that CrCl was 52.84ml/min.  Last month we reduced the dose to Xarelto 15mg  because of his CrCl.  After looking at his labs from yesterday, his CrCl was 53.58ml/min which would prompt a dose change to Xarelto 20mg . Spoke with Pharmacist Megan regarding this because if labs continue to change the pt dose will go back & forth on the dose.  Megan wanted to know if you think Eliquis is a better option because his Creatinine remains below 1.50; please advise. Thank you

## 2016-08-08 NOTE — Telephone Encounter (Signed)
Reviewed plan of care with patient and wife on the telephone. They verbalized understanding and agreement with plan to stop Xarelto and start Eliquis 24 hours later. I advised of lab appointment in June and advised them to call back with questions or concerns prior to that time. We also reviewed high K+ foods due to PCP's concern that blood K+ level is 5.2. They thanked me for the call.

## 2016-08-13 ENCOUNTER — Other Ambulatory Visit: Payer: Self-pay | Admitting: Neurology

## 2016-08-13 ENCOUNTER — Telehealth: Payer: Self-pay | Admitting: Cardiovascular Disease

## 2016-08-13 NOTE — Telephone Encounter (Signed)
New Message   *STAT* If patient is at the pharmacy, call can be transferred to refill team.   1. Which medications need to be refilled? (please list name of each medication and dose if known) Eliquis 5 mg   2. Which pharmacy/location (including street and city if local pharmacy) is medication to be sent to? Mail in   3. Do they need a 30 day or 90 day supply? 90  Patient calling the office for samples of medication:   1.  What medication and dosage are you requesting samples for? Eliquis 5mg    2.  Are you currently out of this medication? Yes

## 2016-08-13 NOTE — Telephone Encounter (Signed)
Called spoke with pt's wife, she states pt was switched from Xarelto to Eliquis by Dr Acie Fredrickson, rx sent to mail order pharmacy approx 1 week ago, pt's wife states they are still waiting for mail order Eliquis to come in mail.  Pt's wife is requesting a 4 day supply be called into [facility] pharmacy because pt took his last dosage of Xarelto yesterday.  Pt's wife requests we call this into pharmacy and tell them they are going to pay cash for 8 tablets, not to file pt's insurance seconadary to [employer].  Offered to give samples, but pt lives 9 miles away round trip and wife states would be more convenient to pick up at local pharmacy.   Called Eliquis 5mg  tablets, 1 tablet BID into [facility] pharmacy, #8 tablets with no refills.  Advised pt's wife rx called into pharmacy as requested, asked pharmacist not to file pt's insurance, advised her TCB if mail order rx doesn't arrive with in next 4 days. Advised pt's wife to have pt start taking Eliquis 5mg  BID this pm at time when pt would normally take next scheduled dose of Xarelto.  Pt's wife verbalized understanding.

## 2016-08-14 ENCOUNTER — Other Ambulatory Visit: Payer: Self-pay

## 2016-08-14 DIAGNOSIS — G3184 Mild cognitive impairment, so stated: Secondary | ICD-10-CM

## 2016-08-14 MED ORDER — CEREFOLIN 6-1-50-5 MG PO TABS: 1.0000 | ORAL_TABLET | ORAL | 3 refills | Status: DC

## 2016-08-14 MED ORDER — METAFOLBIC PLUS 6-2-600 MG PO TABS: 1.0000 | ORAL_TABLET | Freq: Every morning | ORAL | 0 refills | Status: DC

## 2016-08-15 ENCOUNTER — Telehealth: Payer: Self-pay | Admitting: Cardiovascular Disease

## 2016-08-15 NOTE — Telephone Encounter (Signed)
New Message     Patient calling the office for samples of medication:   1.  What medication and dosage are you requesting samples for? Eliquis 5 mg  2.  Are you currently out of this medication? Yes, waiting on mail order , has not arrived

## 2016-08-15 NOTE — Telephone Encounter (Signed)
Telephone   08/13/2016 [facility]  Thayer Headings, MD  Cardiology   Medication Management   Reason for call   Conversation: Medication Management  (Newest Message First)  Brynda Peon, RN      08/13/16 1:49 PM  Note    Called spoke with pt's wife, she states pt was switched from Xarelto to Eliquis by Dr Acie Fredrickson, rx sent to mail order pharmacy approx 1 week ago, pt's wife states they are still waiting for mail order Eliquis to come in mail.  Pt's wife is requesting a 4 day supply be called into [facility] pharmacy because pt took his last dosage of Xarelto yesterday.  Pt's wife requests we call this into pharmacy and tell them they are going to pay cash for 8 tablets, not to file pt's insurance seconadary to [employer].  Offered to give samples, but pt lives 67 miles away round trip and wife states would be more convenient to pick up at local pharmacy.   Called Eliquis 5mg  tablets, 1 tablet BID into [place] pharmacy, #8 tablets with no refills.  Advised pt's wife rx called into pharmacy as requested, asked pharmacist not to file pt's insurance, advised her TCB if mail order rx doesn't arrive with in next 4 days. Advised pt's wife to have pt start taking Eliquis 5mg  BID this pm at time when pt would normally take next scheduled dose of Xarelto.  Pt's wife verbalized understanding.     Brynda Peon, RN  to [place] (Emergency Contact)       08/13/16 1:48 PM  Returned call to pt's wife   1 box of eliquis 5 mg placed at front desk, pt still waiting on mail in. Pt aware samples at front desk.

## 2016-11-01 ENCOUNTER — Other Ambulatory Visit: Payer: Self-pay | Admitting: Neurology

## 2016-11-12 ENCOUNTER — Other Ambulatory Visit: Payer: Medicare Other

## 2016-11-12 DIAGNOSIS — I482 Chronic atrial fibrillation, unspecified: Secondary | ICD-10-CM

## 2016-11-13 LAB — BASIC METABOLIC PANEL
BUN/Creatinine Ratio: 15 (ref 10–24)
BUN: 22 mg/dL (ref 8–27)
CALCIUM: 9.3 mg/dL (ref 8.6–10.2)
CO2: 22 mmol/L (ref 20–29)
Chloride: 107 mmol/L — ABNORMAL HIGH (ref 96–106)
Creatinine, Ser: 1.47 mg/dL — ABNORMAL HIGH (ref 0.76–1.27)
GFR, EST AFRICAN AMERICAN: 52 mL/min/{1.73_m2} — AB (ref >59)
GFR, EST NON AFRICAN AMERICAN: 45 mL/min/{1.73_m2} — AB (ref >59)
Glucose: 135 mg/dL — ABNORMAL HIGH (ref 65–99)
Potassium: 5.5 mmol/L — ABNORMAL HIGH (ref 3.5–5.2)
Sodium: 143 mmol/L (ref 134–144)

## 2016-11-13 LAB — CBC
HEMATOCRIT: 42.3 % (ref 37.5–51.0)
HEMOGLOBIN: 13.4 g/dL (ref 13.0–17.7)
MCH: 28.8 pg (ref 26.6–33.0)
MCHC: 31.7 g/dL (ref 31.5–35.7)
MCV: 91 fL (ref 79–97)
Platelets: 233 10*3/uL (ref 150–379)
RBC: 4.66 x10E6/uL (ref 4.14–5.80)
RDW: 14.6 % (ref 12.3–15.4)
WBC: 7.5 10*3/uL (ref 3.4–10.8)

## 2016-11-14 ENCOUNTER — Other Ambulatory Visit: Payer: Self-pay | Admitting: *Deleted

## 2016-11-14 DIAGNOSIS — E875 Hyperkalemia: Secondary | ICD-10-CM

## 2016-11-29 DIAGNOSIS — E782 Mixed hyperlipidemia: Secondary | ICD-10-CM | POA: Diagnosis not present

## 2016-11-29 DIAGNOSIS — N183 Chronic kidney disease, stage 3 (moderate): Secondary | ICD-10-CM | POA: Diagnosis not present

## 2016-11-29 DIAGNOSIS — I69392 Facial weakness following cerebral infarction: Secondary | ICD-10-CM | POA: Diagnosis not present

## 2016-11-29 DIAGNOSIS — E1129 Type 2 diabetes mellitus with other diabetic kidney complication: Secondary | ICD-10-CM | POA: Diagnosis not present

## 2016-11-29 DIAGNOSIS — N401 Enlarged prostate with lower urinary tract symptoms: Secondary | ICD-10-CM | POA: Diagnosis not present

## 2016-11-29 DIAGNOSIS — J449 Chronic obstructive pulmonary disease, unspecified: Secondary | ICD-10-CM | POA: Diagnosis not present

## 2016-11-29 DIAGNOSIS — E0842 Diabetes mellitus due to underlying condition with diabetic polyneuropathy: Secondary | ICD-10-CM | POA: Diagnosis not present

## 2016-12-19 ENCOUNTER — Other Ambulatory Visit: Payer: Medicare Other | Admitting: *Deleted

## 2016-12-19 ENCOUNTER — Ambulatory Visit (INDEPENDENT_AMBULATORY_CARE_PROVIDER_SITE_OTHER): Payer: Medicare Other | Admitting: Neurology

## 2016-12-19 ENCOUNTER — Encounter: Payer: Self-pay | Admitting: Neurology

## 2016-12-19 VITALS — BP 124/70 | HR 85 | Ht 66.0 in | Wt 176.0 lb

## 2016-12-19 DIAGNOSIS — E875 Hyperkalemia: Secondary | ICD-10-CM

## 2016-12-19 DIAGNOSIS — I701 Atherosclerosis of renal artery: Secondary | ICD-10-CM | POA: Diagnosis not present

## 2016-12-19 DIAGNOSIS — G3184 Mild cognitive impairment, so stated: Secondary | ICD-10-CM

## 2016-12-19 LAB — BASIC METABOLIC PANEL
BUN / CREAT RATIO: 14 (ref 10–24)
BUN: 19 mg/dL (ref 8–27)
CO2: 21 mmol/L (ref 20–29)
Calcium: 9.5 mg/dL (ref 8.6–10.2)
Chloride: 103 mmol/L (ref 96–106)
Creatinine, Ser: 1.38 mg/dL — ABNORMAL HIGH (ref 0.76–1.27)
GFR, EST AFRICAN AMERICAN: 56 mL/min/{1.73_m2} — AB (ref >59)
GFR, EST NON AFRICAN AMERICAN: 49 mL/min/{1.73_m2} — AB (ref >59)
Glucose: 100 mg/dL — ABNORMAL HIGH (ref 65–99)
POTASSIUM: 4.7 mmol/L (ref 3.5–5.2)
SODIUM: 141 mmol/L (ref 134–144)

## 2016-12-19 NOTE — Progress Notes (Signed)
[facility] [address] [place]. [place] [address] [phone]       OFFICE FOLLOW UP VISIT NOTE  [name] Date of Birth:  Dec 22, 1935 Medical Record Number:  [id number]   Referring MD:  Gabriel Gutierrez  Reason for Referral:  Vision and memory loss  HPI: 81 year male who had a transient episode of vision loss in the right eye on 01/28/14. He noticed cloudiness which she describes as a horizontal line in his right eye and he could not see below the line the bottom half but he was able to see above it. This lasted only a few minutes and recovered. He denied any accompanying of following headache for seeing zigzag patterns or lines. He is dizzy had a similar episode in November 2014 when he had bilateral vision loss involving the lower half of the field which also lasted only a minute or so. He did not seek any medical help her evaluation in that time period. She denies any chronic headaches, muscle aches, pain, scalp tenderness or jaw claudication. He was seen by me in October 2013 when he had a right parietal insular cortex infarct and was diagnosed with new-onset atrial fibrillation. He is done well from that stroke and recovered completely. He has been on Xarelto of and has been tolerated well without bleeding or bruising. He is in a complain of memory loss particularly not remembering dates and names as well as his hands. He gets frustrated from this and got treated recently. He however remains independent of activities of daily living. His wife handles the finances but the patient can drive and manage his own affairs. He states his blood pressure control has been quite good and his fasting sugars usually run below 120. Update 06/07/2014 : He returns for follow-up after last visit 6 months ago. He continues to do well and has not had a recurrent stroke or TIA symptoms. He saw ophthalmologist Dr. Samara Gutierrez who felt he must have had cholesterol emboli. The patient did  undergo MRI scan of the brain on 02/14/14 which showed no acute abnormality with changes of chronic microvascular ischemia and generalized cerebral atrophy. MRA of the brain and neck did not reveal significant extra or intracranial vascular stenosis. Lab work  On 02/02/14 showed normal vitamin B12, TSH and RPR. Today on the Mini-Mental Status exam score 28/30. He had a colonoscopy yesterday and had held his xarelto for 5 days. He had 3 polyps removed and plans on restarting Xarelto he has not been doing mentally challenging exercises. Update 12/06/2014 : He returns for follow-up after last visit 6 months ago. He is accompanied by his wife feels that his memory may be somewhat better. He is taking Cerefolin but finds it quite expensive. He continues to have trouble remembering mainly recent information the memory from the past seems quite good. He is independent in activities of daily living. There have been no changes in behavior, agitation, hallucinations or nightmares or delusions noted. He continues to be bothered by left knee pain and wears a knee brace. He has not had any recurrent stroke or TIA symptoms and remains on Xarelto which is tolerating well without significant bleeding or bruising. His blood pressure seems well controlled. Lipid profile has not been checked recently. Update 06/20/2015:  He returns for follow-up after last visit 6 months ago. He is accompanied by son. He states is doing well and his memory is quite stable. Acute symptoms are quite active. He does mentally challenging  activities like crossword puzzles. Is physically also quite active. He unfortunately is having severe left knee pain and requires knee replacement which is scheduled for a couple of weeks. He remains on Xarelto which is tolerating well without side effects. He had taken Cerefolin which I had prescribed at last visit but when he ran out of the prescription his did not call for refill. Update 12/20/2015 : He returns for  follow-up after last visit 6 months ago. Is accompanied by his wife. He remained stable from neurovascular standpoint without recurrent TIA or stroke symptoms. He did undergo knee replacement successfully and is back on Xarelto which is tolerating well with only minor bruising but no bleeding. He unfortunately lost his 67-year-old daughter as well as his diet recently. He continues to have short-term memory difficulties and very occasionally word finding as well. He had run out of Cerefolin nac noticed that his cognitive difficulties got worse but now he  is back on it`s generic version and feels better. He did not start Resevetrol as had recommended last time. He does keep himself mentally challenged by taking on building or painting projects.. He states his blood pressure is well controlled and today it is 109/63. He has no new complaints today. Update 12/19/2016 ; he returns for follow-up after last visit a year ago. He is accompanied by his wife. Continues to have mild short-term memory difficulties which appear to be unchanged. He had partial knee replacement done by Dr. Lorre Gutierrez which has gone well. He is concerned about his kidneys but recent lab work from 11/29/16 shows creatinine 1.16 but potassium is elevated at 5.7 for which a follow-up lab work is scheduled for next week. LDLcholesterol was 53 mg percent , triglycerides 104 and total cholesterol 119 mg percent. Patient continues to take care of meta-folbate plus which is generic form of Cerefolin nac  as it is cheaper. He does participate in cognitively challenging activities. He was on Xarelto which was switched to eliquis due to concerns about renal insufficiency 6 months ago. He is tolerating it well without bruising or bleeding. Has not had any recurrent TIA or stroke symptoms since 2013. He has no new complaints today. His blood pressure is well controlled today at 124/70. He does have a very strong family history of Alzheimer's and is worried about  developing it himself ROS:   14 system review of systems is positive for hearing loss,  , memory loss , cough, wheezing, frequent waking, daytime sleepiness and all other systems negative  PMH:  Past Medical History:  Diagnosis Date  . AAA (abdominal aortic aneurysm) ([place])   . Arthritis   . Atrial fibrillation ([place])   . Coronary artery disease    post CABG x5 --Severe three-vessel coronary disease  . Diabetes mellitus Age 81  . Dysrhythmia   . Hyperlipidemia   . Hypertension   . Irregular heart beat  Feb 18, 2012  . Renal artery stenosis ([place])   . Stroke [facility]) Oct. 1, 2013   Mini stroke- Afib    Social History:  Social History   Social History  . Marital status: Married    Spouse name: N/A  . Number of children: N/A  . Years of education: N/A   Occupational History  . Not on file.   Social History Main Topics  . Smoking status: Former Smoker    Packs/day: 2.00    Years: 44.00    Types: Cigarettes    Quit date: 05/20/1988  . Smokeless tobacco: Former  User    Quit date: 02/11/2003  . Alcohol use No  . Drug use: No  . Sexual activity: No   Other Topics Concern  . Not on file   Social History Narrative  . No narrative on file    Medications:   Current Outpatient Prescriptions on File Prior to Visit  Medication Sig Dispense Refill  . albuterol (PROVENTIL HFA;VENTOLIN HFA) 108 (90 Base) MCG/ACT inhaler Inhale 1 puff into the lungs every 6 (six) hours as needed for wheezing or shortness of breath.    [name] apixaban (ELIQUIS) 5 MG TABS tablet Take 1 tablet (5 mg total) by mouth 2 (two) times daily. 180 tablet 3  . atorvastatin (LIPITOR) 80 MG tablet TAKE 1 TABLET DAILY (Patient taking differently: Take 80 mg by mouth every evening. TAKE 1 TABLET DAILY) 90 tablet 1  . diltiazem (CARDIZEM CD) 120 MG 24 hr capsule Take 1 capsule (120 mg total) by mouth daily. 90 capsule 1  . metFORMIN (GLUCOPHAGE) 500 MG tablet Take 500 mg by mouth 2 (two) times daily with a meal.      .  Methylfol-Methylcob-Acetylcyst (METAFOLBIC PLUS) 6-2-600 MG TABS Take 1 tablet by mouth every morning. 90 tablet 0  . metoprolol tartrate (LOPRESSOR) 25 MG tablet Take 25 mg by mouth daily.    . tamsulosin (FLOMAX) 0.4 MG CAPS capsule Take 0.4 mg by mouth daily.      No current facility-administered medications on file prior to visit.     Allergies:  No Known Allergies  Physical Exam General: well developed, well nourished elderly male, seated, in no evident distress Head: head normocephalic and atraumatic.  Neck: supple with no carotid or supraclavicular bruits Cardiovascular: regular rate and rhythm, no murmurs Musculoskeletal: no deformity Skin:  no rash/petichiae Vascular:  Normal pulses all extremities Vitals:   12/19/16 1100  BP: 124/70  Pulse: 85    Neurologic Exam Mental Status: Awake and fully alert. Oriented to place and time. Recent and remote memory diminished. Attention span, concentration and fund of knowledge appropriate. Mood and affect appropriate. Mini-Mental status exam 29/30. Recall 2/3. AFT 19.[name] Clock drawing 2/4.   Cranial Nerves: Fundoscopic exam not done . Pupils equal, briskly reactive to light. Extraocular movements full without nystagmus. Visual fields full to confrontation. Hearing diminished bilaterally. Facial sensation intact. Face, tongue, palate moves normally and symmetrically.  Motor: Normal bulk and tone. Normal strength in all tested extremity muscles. Sensory.: intact to touch and pinprick and vibratory sensation.  Coordination: Rapid alternating movements normal in all extremities. Finger-to-nose and heel-to-shin performed accurately bilaterally. Gait and Station: Arises from chair without difficulty. Stance is normal. Gait demonstrates normal stride length and balance . Unable to heel, toe and tandem walk without difficulty.  Reflexes: 1+ and symmetric. Toes downgoing.     ASSESSMENT: 81 year male with transient episodes of visual loss of  unclear etiology possibly small vessel retinal TIAs. He complains of memory and cognitive difficulties likely from mild cognitive impairment but strong family history of Alzheimer's and is concerning. Remote history of embolic right MCA branch infarct in October 2013 from atrial fibrillation with good neurological recovery    PLAN: I had a long discussion with the patient and his wife regarding his mild cognitive impairment which appears to be stable. I recommend he continue Metafolbic plus daily  . He was encouraged to continue participation in cognitively challenging activities like solving crossword puzzles, sudoku. He will continue Eliquis for atrial fibrillation for stroke prevention.  He was advised  to continue maintaining strict control of lipids with LDL cholesterol goal below 70 mg percent, hypertension with blood pressure goal below 130/90 and diabetes with hemoglobin A1c goal below 6.5.% .He may also consider possible participation in the [place] study if interested and will be given information to review at home and decide.   He will return for follow-up in one year or call earlier if necessary.Greater than 50% time during this 25 minute visit was spent on counseling and coordination of care upon mild cognitive impairment and stroke risk He will return for follow-up in one year or call earlier if necessary  Antony Contras, MD Note: This document was prepared with digital dictation and possible smart phrase technology. Any transcriptional errors that result from this process are unintentional.

## 2016-12-19 NOTE — Patient Instructions (Signed)
I had a long discussion with the patient and his wife regarding his mild cognitive impairment which appears to be stable. I recommend he continue Metafolbic plus daily  . He was encouraged to continue participation in cognitively challenging activities like solving crossword puzzles, sudoku. He will continue Eliquis for atrial fibrillation for stroke prevention.  He was advised to continue maintaining strict control of lipids with LDL cholesterol goal below 70 mg percent, hypertension with blood pressure goal below 130/90 and diabetes with hemoglobin A1c goal below 6.5.% .He may also consider possible participation in the [place] study if interested and will be given information to review at home and decide.   He will return for follow-up in one year or call earlier if necessary

## 2017-01-23 DIAGNOSIS — J441 Chronic obstructive pulmonary disease with (acute) exacerbation: Secondary | ICD-10-CM | POA: Diagnosis not present

## 2017-02-10 ENCOUNTER — Other Ambulatory Visit: Payer: Self-pay | Admitting: Cardiovascular Disease

## 2017-02-22 DIAGNOSIS — Z23 Encounter for immunization: Secondary | ICD-10-CM | POA: Diagnosis not present

## 2017-04-01 DIAGNOSIS — J449 Chronic obstructive pulmonary disease, unspecified: Secondary | ICD-10-CM | POA: Diagnosis not present

## 2017-04-01 DIAGNOSIS — N401 Enlarged prostate with lower urinary tract symptoms: Secondary | ICD-10-CM | POA: Diagnosis not present

## 2017-04-01 DIAGNOSIS — I10 Essential (primary) hypertension: Secondary | ICD-10-CM | POA: Diagnosis not present

## 2017-04-01 DIAGNOSIS — N183 Chronic kidney disease, stage 3 (moderate): Secondary | ICD-10-CM | POA: Diagnosis not present

## 2017-04-01 DIAGNOSIS — I69392 Facial weakness following cerebral infarction: Secondary | ICD-10-CM | POA: Diagnosis not present

## 2017-04-01 DIAGNOSIS — E0842 Diabetes mellitus due to underlying condition with diabetic polyneuropathy: Secondary | ICD-10-CM | POA: Diagnosis not present

## 2017-04-01 DIAGNOSIS — E782 Mixed hyperlipidemia: Secondary | ICD-10-CM | POA: Diagnosis not present

## 2017-04-01 DIAGNOSIS — E1129 Type 2 diabetes mellitus with other diabetic kidney complication: Secondary | ICD-10-CM | POA: Diagnosis not present

## 2017-04-02 DIAGNOSIS — Z7984 Long term (current) use of oral hypoglycemic drugs: Secondary | ICD-10-CM | POA: Diagnosis not present

## 2017-04-02 DIAGNOSIS — E119 Type 2 diabetes mellitus without complications: Secondary | ICD-10-CM | POA: Diagnosis not present

## 2017-04-30 DIAGNOSIS — Z6828 Body mass index (BMI) 28.0-28.9, adult: Secondary | ICD-10-CM | POA: Diagnosis not present

## 2017-04-30 DIAGNOSIS — Z0000 Encounter for general adult medical examination without abnormal findings: Secondary | ICD-10-CM | POA: Diagnosis not present

## 2017-05-17 ENCOUNTER — Other Ambulatory Visit: Payer: Self-pay | Admitting: Neurology

## 2017-05-21 ENCOUNTER — Other Ambulatory Visit: Payer: Self-pay | Admitting: *Deleted

## 2017-07-01 DIAGNOSIS — J410 Simple chronic bronchitis: Secondary | ICD-10-CM | POA: Diagnosis not present

## 2017-07-10 DIAGNOSIS — J410 Simple chronic bronchitis: Secondary | ICD-10-CM | POA: Diagnosis not present

## 2017-07-18 ENCOUNTER — Encounter (INDEPENDENT_AMBULATORY_CARE_PROVIDER_SITE_OTHER): Payer: Self-pay

## 2017-07-18 ENCOUNTER — Ambulatory Visit (INDEPENDENT_AMBULATORY_CARE_PROVIDER_SITE_OTHER): Payer: Medicare Other | Admitting: Cardiovascular Disease

## 2017-07-18 ENCOUNTER — Encounter: Payer: Self-pay | Admitting: Cardiovascular Disease

## 2017-07-18 VITALS — BP 118/60 | HR 74 | Ht 66.0 in | Wt 169.0 lb

## 2017-07-18 DIAGNOSIS — E782 Mixed hyperlipidemia: Secondary | ICD-10-CM

## 2017-07-18 DIAGNOSIS — Z7901 Long term (current) use of anticoagulants: Secondary | ICD-10-CM | POA: Diagnosis not present

## 2017-07-18 DIAGNOSIS — I482 Chronic atrial fibrillation, unspecified: Secondary | ICD-10-CM

## 2017-07-18 DIAGNOSIS — I2510 Atherosclerotic heart disease of native coronary artery without angina pectoris: Secondary | ICD-10-CM | POA: Diagnosis not present

## 2017-07-18 DIAGNOSIS — I10 Essential (primary) hypertension: Secondary | ICD-10-CM

## 2017-07-18 LAB — BASIC METABOLIC PANEL
BUN / CREAT RATIO: 17 (ref 10–24)
BUN: 19 mg/dL (ref 8–27)
CALCIUM: 9.5 mg/dL (ref 8.6–10.2)
CO2: 24 mmol/L (ref 20–29)
CREATININE: 1.14 mg/dL (ref 0.76–1.27)
Chloride: 103 mmol/L (ref 96–106)
GFR, EST AFRICAN AMERICAN: 70 mL/min/{1.73_m2} (ref >59)
GFR, EST NON AFRICAN AMERICAN: 61 mL/min/{1.73_m2} (ref >59)
Glucose: 103 mg/dL — ABNORMAL HIGH (ref 65–99)
Potassium: 5.1 mmol/L (ref 3.5–5.2)
Sodium: 142 mmol/L (ref 134–144)

## 2017-07-18 LAB — CBC
HEMATOCRIT: 45.1 % (ref 37.5–51.0)
HEMOGLOBIN: 15.5 g/dL (ref 13.0–17.7)
MCH: 31.1 pg (ref 26.6–33.0)
MCHC: 34.4 g/dL (ref 31.5–35.7)
MCV: 90 fL (ref 79–97)
PLATELETS: 311 10*3/uL (ref 150–379)
RBC: 4.99 x10E6/uL (ref 4.14–5.80)
RDW: 13.6 % (ref 12.3–15.4)
WBC: 12.7 10*3/uL — AB (ref 3.4–10.8)

## 2017-07-18 LAB — LIPID PANEL
Chol/HDL Ratio: 2.5 ratio (ref 0.0–5.0)
Cholesterol, Total: 142 mg/dL (ref 100–199)
HDL: 56 mg/dL (ref >39)
LDL CALC: 62 mg/dL (ref 0–99)
Triglycerides: 121 mg/dL (ref 0–149)
VLDL Cholesterol Cal: 24 mg/dL (ref 5–40)

## 2017-07-18 LAB — HEPATIC FUNCTION PANEL
ALBUMIN: 4.7 g/dL (ref 3.5–4.8)
ALK PHOS: 113 IU/L (ref 39–117)
ALT: 13 IU/L (ref 0–44)
AST: 23 IU/L (ref 0–40)
Bilirubin Total: 0.4 mg/dL (ref 0.0–1.2)
Bilirubin, Direct: 0.09 mg/dL (ref 0.00–0.40)
TOTAL PROTEIN: 6.5 g/dL (ref 6.0–8.5)

## 2017-07-18 NOTE — Patient Instructions (Signed)
Medication Instructions:  Your physician recommends that you continue on your current medications as directed. Please refer to the Current Medication list given to you today.   Labwork: TODAY - CBC, cholesterol, liver panel, basic metabolic panel   Testing/Procedures: None Ordered   Follow-Up: Your physician wants you to follow-up in: 1 year with Dr. Acie Fredrickson.  You will receive a reminder letter in the mail two months in advance. If you don't receive a letter, please call our office to schedule the follow-up appointment.   If you need a refill on your cardiac medications before your next appointment, please call your pharmacy.   Thank you for choosing [facility]! Christen Bame, RN [phone]

## 2017-07-18 NOTE — Progress Notes (Signed)
Gabriel Gutierrez Date of Birth  June 01, 1935 [facility] [address]    [place], [place]  [address] [phone]  Fax  [phone]  Problem list: 1. Coronary artery disease-status post CABG 2. Abdominal aortic aneurysm-Status post stent grafting 3. Diabetes mellitus 4. Dyslipidemia 5. Hypertension 6. Atrial fibrillation -  We have tried cardioversion in the past but it lasted only 1 week.  7. History of stroke   Previous Notes:  Gabriel Gutierrez is a 82 yo gentleman with a history of coronary artery disease-status post coronary artery bypass grafting in 2007. He has a history of hypertension.   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    He was recently admitted to the hospital with an acute stroke. He was also found to have rapid atrial fibrillation. He stroke symptoms resolve fairly quickly and he does not have much of the deficit. He remains in atrial fibrillation.  He's been maintained on Xarelto.  He's completely asymptomatic from a cardiac standpoint. He cannot tell that his heart rate is beating irregularly.  Dec. 19, 2013 He had a cardioversion performed last month by Dr. Irish Lack who was kind enough to perform the cardioversion after I got tied up in the office. He is completely asymptomatic. He cannot tell whether his heart is in atrial fibrillation or normal sinus rhythm. He has been able to do all of his normal activities without difficulty.  August 10, 2012:  Gabriel Gutierrez is doing well.  His potassium levels have been running high. He had blood checked at his medical doctors office in February and his potassium level was 5.8. He discontinued the benazepril / HCT. Recheck potassium was even higher. He had it checked again this week with results are pending.  He is cutting out all high potassium foods.   He had a cardioversion in   Dec. 11,  2014:  Gabriel Gutierrez is doing well.  Active , works hard every day, no CP.  No dyspnea.  Sleeping well at night.  Completely asymptomatic from an a-fib standpoint.   October 27, 2013: Gabriel Gutierrez is doing ok.    Not able to do as much as he would like.  Still very active - plays golf,  Walking some .  Has some knee problems that limits him.    Dec. 7, 2015: Gabriel Gutierrez is seen today for follow up of his CAD, AAA repair, atrial fib., and dyslipidemia.  He also has a hx of DM and a CVA.   November 08, 2014:  Doing well from a cardiac standpoint. Trouble with knees.  Has had a stroke - on xarelto .  Had an occular stroke  Jan. 23. 2017 Gabriel Gutierrez is doing well from a cardiac standpoint. Needs partial replacement of the left knee Doing great.  No CP,  Has some DOE with significant exertion. No DOE doing regular chores  Jan. 29, 2018: No cardiac issues over the past year.  Has some left temple pain .  BP and HR are normal  July 18, 2017 Seen with granddaughter, Gabriel Gutierrez (cardiac nurse [facility] )  Staying active.  Is slowing down.   Just getting over pneumonia.  Gets lots of yard work.   Plays golf regularly  No CP or dypsena ( was short of breath with pneumonia )    Current Outpatient Medications on File Prior to  Visit  Medication Sig Dispense Refill  . apixaban (ELIQUIS) 5 MG TABS tablet Take 1 tablet (5 mg total) by mouth 2 (two) times daily. 180 tablet 3  . atorvastatin (LIPITOR) 80 MG tablet Take 80 mg by mouth daily.    [name] diltiazem (CARDIZEM CD) 120 MG 24 hr capsule Take 1 capsule (120 mg total) by mouth daily. 90 capsule 0  . metFORMIN (GLUCOPHAGE) 500 MG tablet Take 500 mg by mouth 2 (two) times daily with a meal.      . METHYLFOL-METHYLCOB-ACETYLCYST PO Take 1 tablet by mouth daily.    . metoprolol tartrate (LOPRESSOR) 25 MG tablet Take 25 mg by mouth daily.    . tamsulosin (FLOMAX) 0.4 MG CAPS capsule Take 0.4 mg by mouth daily.      No current facility-administered medications on file prior  to visit.     No Known Allergies  Past Medical History:  Diagnosis Date  . AAA (abdominal aortic aneurysm) ([place])   . Arthritis   . Atrial fibrillation ([place])   . Coronary artery disease    post CABG x5 --Severe three-vessel coronary disease  . Diabetes mellitus Age 40  . Dysrhythmia   . Hyperlipidemia   . Hypertension   . Irregular heart beat  Feb 18, 2012  . Renal artery stenosis ([place])   . Stroke [facility]) Oct. 1, 2013   Mini stroke- Afib    Past Surgical History:  Procedure Laterality Date  . cabgx5    . CARDIAC CATHETERIZATION  08/26/2005   Est. EF of 65-70% -- Severe three-vessel coronary artery disease -- very heavily calcified vessels and really there is no role for angioplasty  -- he heavily calcified vessels and really there is no role for angioplasty abdominal aortic aneurysm surgery  . CARDIOVERSION  04/15/2012   Procedure: CARDIOVERSION;  Surgeon: Thayer Headings, MD;  Location: [facility];  Service: Cardiovascular;  Laterality: N/A;  . CATARACT EXTRACTION     left eye  . COLON SURGERY     ruptured colon  . CORONARY ARTERY BYPASS GRAFT  09/02/2005   x 5 -- left internal mammary artery graft to the left anterior descending coronary artery, with a saphenous vein graft to the diagonal branch of the LAD, a sequential saphenous vein graft to the second and fourth obtuse marginal branches of the left circumflex coronary artery, and a saphenous vein graft to the posterior descending branch of RCA  -- Endoscopic vein harvesting from the left leg  . EYE SURGERY Left    Cataract  . EYE SURGERY Right    Cataract  . HAND SURGERY  1997  . LAPAROTOMY  1998  . MEDIAN STERNOTOMY    . PARTIAL KNEE ARTHROPLASTY Left 07/17/2015   Procedure: UNICOMPARTMENTAL KNEE;  Surgeon: Vickey Huger, MD;  Location: [facility];  Service: Orthopedics;  Laterality: Left;  . PR VEIN BYPASS GRAFT,AORTO-FEM-POP  12/23/2005   Infrarenal abdominal aortic aneurysm, right common iliac occlusive disease --   .  SKIN GRAFT  1992    Social History   Tobacco Use  Smoking Status Former Smoker  . Packs/day: 2.00  . Years: 44.00  . Pack years: 88.00  . Types: Cigarettes  . Last attempt to quit: 05/20/1988  . Years since quitting: 29.1  Smokeless Tobacco Former Systems developer  . Quit date: 02/11/2003    Social History   Substance and Sexual Activity  Alcohol Use No  . Alcohol/week: 0.0 oz    Family History  Problem Relation Age of Onset  .  Coronary artery disease Mother   . Heart disease Mother        After age 82-Carotid  . Hyperlipidemia Mother   . Stroke Mother   . Coronary artery disease Father   . Hyperlipidemia Father   . Hypertension Father   . Varicose Veins Father   . Heart attack Father   . Coronary artery disease Sister   . Heart disease Sister        After age 62- BP  . Heart attack Sister   . Diabetes Brother   . Heart disease Brother        After 51 yrs of age  . Heart attack Brother   . Hyperlipidemia Brother   . Alzheimer's disease Sister   . Seizures Sister     Reviw of Systems:  Reviewed in the HPI.  All other systems are negative.  Physical Exam: Blood pressure 118/60, pulse 74, height 5\' 6"  (1.676 m), weight 169 lb (76.7 kg).  GEN:  Well nourished, well developed in no acute distress HEENT: Normal NECK: No JVD; No carotid bruits LYMPHATICS: No lymphadenopathy CARDIAC: irreg. Irreg.  RESPIRATORY:  Clear to auscultation without rales, wheezing or rhonchi  ABDOMEN: Soft, non-tender, non-distended MUSCULOSKELETAL:  No edema; No deformity  SKIN: Warm and dry NEUROLOGIC:  Alert and oriented x 3   ECG: July 18, 2017: Atrial fibrillation with a heart rate of 74.  T wave inversions in the Inferior and  lateral leads.   Assessment / Plan:   1. Coronary artery disease-status post CABG -    No angina .  Doing well Lipids have been well controlled.   2. Abdominal aortic aneurysm-Status post stent grafting 3. Diabetes mellitus 4. Dyslipidemia -   Check lipids  and liver enz today    5. Hypertension -  BP is well controlled.    6. Atrial fibrillation -   Chronic.  HR is well controlled.  On Eliquis  Check labs today - CBC, BMP   7. History of stroke   Mertie Moores, MD  07/18/2017 9:44 AM    [address],  [place], [place]  [address] Pager [phone] Phone: [phone]; Fax: [phone]

## 2017-07-30 DIAGNOSIS — J449 Chronic obstructive pulmonary disease, unspecified: Secondary | ICD-10-CM | POA: Diagnosis not present

## 2017-07-30 DIAGNOSIS — N183 Chronic kidney disease, stage 3 (moderate): Secondary | ICD-10-CM | POA: Diagnosis not present

## 2017-07-30 DIAGNOSIS — I10 Essential (primary) hypertension: Secondary | ICD-10-CM | POA: Diagnosis not present

## 2017-07-30 DIAGNOSIS — E1129 Type 2 diabetes mellitus with other diabetic kidney complication: Secondary | ICD-10-CM | POA: Diagnosis not present

## 2017-07-30 DIAGNOSIS — E0842 Diabetes mellitus due to underlying condition with diabetic polyneuropathy: Secondary | ICD-10-CM | POA: Diagnosis not present

## 2017-07-30 DIAGNOSIS — E782 Mixed hyperlipidemia: Secondary | ICD-10-CM | POA: Diagnosis not present

## 2017-07-30 DIAGNOSIS — I69392 Facial weakness following cerebral infarction: Secondary | ICD-10-CM | POA: Diagnosis not present

## 2017-07-30 DIAGNOSIS — N401 Enlarged prostate with lower urinary tract symptoms: Secondary | ICD-10-CM | POA: Diagnosis not present

## 2017-08-06 ENCOUNTER — Encounter: Payer: Self-pay | Admitting: Gastroenterology

## 2017-08-11 ENCOUNTER — Ambulatory Visit: Payer: Medicare Other | Admitting: Family

## 2017-08-18 DIAGNOSIS — J410 Simple chronic bronchitis: Secondary | ICD-10-CM | POA: Diagnosis not present

## 2017-08-25 ENCOUNTER — Ambulatory Visit
Admission: RE | Admit: 2017-08-25 | Discharge: 2017-08-25 | Disposition: A | Discharge status: Home / Self Care | Payer: Medicare Other | Source: Ambulatory Visit | Attending: Family | Admitting: Family

## 2017-08-25 ENCOUNTER — Encounter: Payer: Self-pay | Admitting: Family

## 2017-08-25 ENCOUNTER — Ambulatory Visit (INDEPENDENT_AMBULATORY_CARE_PROVIDER_SITE_OTHER): Payer: Medicare Other | Admitting: Family

## 2017-08-25 VITALS — BP 117/73 | HR 88 | Temp 96.8°F | Resp 17 | Ht 66.0 in | Wt 170.0 lb

## 2017-08-25 DIAGNOSIS — I701 Atherosclerosis of renal artery: Secondary | ICD-10-CM

## 2017-08-25 DIAGNOSIS — I714 Abdominal aortic aneurysm, without rupture, unspecified: Secondary | ICD-10-CM

## 2017-08-25 DIAGNOSIS — Z95828 Presence of other vascular implants and grafts: Secondary | ICD-10-CM

## 2017-08-25 DIAGNOSIS — Z8673 Personal history of transient ischemic attack (TIA), and cerebral infarction without residual deficits: Secondary | ICD-10-CM | POA: Diagnosis not present

## 2017-08-25 MED ORDER — IOPAMIDOL (ISOVUE-370) INJECTION 76%
75.0000 mL | Freq: Once | INTRAVENOUS | Status: AC | PRN
  Administered 2017-08-25: 75 mL via INTRAVENOUS

## 2017-08-25 NOTE — Progress Notes (Signed)
[facility] [place]  CC: Follow up s/p Endovascular Repair of Abdominal Aortic Aneurysm    History of Present Illness  Gabriel Gutierrez is a 82 y.o. (1935/12/04) male who is s/p aortobiiliac Cook Zenith stent graft placed by Dr. Kellie Simmering in 2007 for abdominal aortic aneurysm.  He had a stroke October, 2013, has a-fib. for which he is taking Jennye Moccasin, now prescribed by Dr. Acie Fredrickson, his cardiologist.  His left side weakness has resolved.  He denies back or abdominal pain.  The patient's blood pressure has been stable. Pt states his blood pressure at home is 120-135/70's. The patient's blood pressure medication regimen has remained stable. The patient's urinary history has remained stable, he does seem to have BPH, Flomax helps.   He had a partial left knee replacement in March 2017. He does not have claudication symptoms with walking, denies non healing wounds.  He attributes his higher blood pressure to drinking coffee before his blood pressure was checked and not taking his blood pressure medication this morning yet.  He states he was treated recently for the flu with respiratory component, using an inhaler, states his wheezing is improving.   DM: last A1C was 6.4 on 07-29-16 (pt brought lab results with him from Dr. Reinaldo Meeker, his PCP).  Pt smoker: former smoker, quit in 1990  Pt meds include: Statin :Yes Betablocker: Yes ASA: No Other anticoagulants/antiplatelets: Xaralto for atrial fib     Past Medical History:  Diagnosis Date  . AAA (abdominal aortic aneurysm) ([place])   . Arthritis   . Atrial fibrillation ([place])   . Coronary artery disease    post CABG x5 --Severe three-vessel coronary disease  . Diabetes mellitus Age 11  . Dysrhythmia   . Hyperlipidemia   . Hypertension   . Irregular heart beat  Feb 18, 2012  . Renal artery stenosis ([place])   . Stroke [facility]) Oct. 1, 2013   Mini stroke- Afib   Past Surgical History:  Procedure Laterality  Date  . cabgx5    . CARDIAC CATHETERIZATION  08/26/2005   Est. EF of 65-70% -- Severe three-vessel coronary artery disease -- very heavily calcified vessels and really there is no role for angioplasty  -- he heavily calcified vessels and really there is no role for angioplasty abdominal aortic aneurysm surgery  . CARDIOVERSION  04/15/2012   Procedure: CARDIOVERSION;  Surgeon: Thayer Headings, MD;  Location: [facility];  Service: Cardiovascular;  Laterality: N/A;  . CATARACT EXTRACTION     left eye  . COLON SURGERY     ruptured colon  . CORONARY ARTERY BYPASS GRAFT  09/02/2005   x 5 -- left internal mammary artery graft to the left anterior descending coronary artery, with a saphenous vein graft to the diagonal branch of the LAD, a sequential saphenous vein graft to the second and fourth obtuse marginal branches of the left circumflex coronary artery, and a saphenous vein graft to the posterior descending branch of RCA  -- Endoscopic vein harvesting from the left leg  . EYE SURGERY Left    Cataract  . EYE SURGERY Right    Cataract  . HAND SURGERY  1997  . LAPAROTOMY  1998  . MEDIAN STERNOTOMY    . PARTIAL KNEE ARTHROPLASTY Left 07/17/2015   Procedure: UNICOMPARTMENTAL KNEE;  Surgeon: Vickey Huger, MD;  Location: [facility];  Service: Orthopedics;  Laterality: Left;  . PR VEIN BYPASS GRAFT,AORTO-FEM-POP  12/23/2005   Infrarenal abdominal aortic aneurysm, right common iliac  occlusive disease --   . SKIN GRAFT  1992   Social History Social History   Tobacco Use  . Smoking status: Former Smoker    Packs/day: 2.00    Years: 44.00    Pack years: 88.00    Types: Cigarettes    Last attempt to quit: 05/20/1988    Years since quitting: 29.2  . Smokeless tobacco: Former Systems developer    Quit date: 02/11/2003  Substance Use Topics  . Alcohol use: No    Alcohol/week: 0.0 oz  . Drug use: No   Family History Family History  Problem Relation Age of Onset  . Coronary artery disease Mother   . Heart  disease Mother        After age 20-Carotid  . Hyperlipidemia Mother   . Stroke Mother   . Coronary artery disease Father   . Hyperlipidemia Father   . Hypertension Father   . Varicose Veins Father   . Heart attack Father   . Coronary artery disease Sister   . Heart disease Sister        After age 61- BP  . Heart attack Sister   . Diabetes Brother   . Heart disease Brother        After 63 yrs of age  . Heart attack Brother   . Hyperlipidemia Brother   . Alzheimer's disease Sister   . Seizures Sister    Current Outpatient Medications on File Prior to Visit  Medication Sig Dispense Refill  . apixaban (ELIQUIS) 5 MG TABS tablet Take 1 tablet (5 mg total) by mouth 2 (two) times daily. 180 tablet 3  . atorvastatin (LIPITOR) 80 MG tablet Take 80 mg by mouth daily.    [name] diltiazem (CARDIZEM CD) 120 MG 24 hr capsule Take 1 capsule (120 mg total) by mouth daily. 90 capsule 0  . metFORMIN (GLUCOPHAGE) 500 MG tablet Take 500 mg by mouth 2 (two) times daily with a meal.      . METHYLFOL-METHYLCOB-ACETYLCYST PO Take 1 tablet by mouth daily.    . metoprolol tartrate (LOPRESSOR) 25 MG tablet Take 25 mg by mouth daily.    . tamsulosin (FLOMAX) 0.4 MG CAPS capsule Take 0.4 mg by mouth daily.      No current facility-administered medications on file prior to visit.    No Known Allergies   ROS: See HPI for pertinent positives and negatives.  Physical Examination  Vitals:   08/25/17 1414 08/25/17 1417  BP: 129/74 117/73  Pulse: 88   Resp: 17   Temp: (!) 96.8 F (36 C)   TempSrc: Oral   SpO2: 95%   Weight: 170 lb (77.1 kg)   Height: 5\' 6"  (1.676 m)    Body mass index is 27.44 kg/m.  General:A&O x 3, WD.  Pulmonary: Sym exp, decreased air movt in right posterior fields with few expiratory wheezes, no rales or rhonchi.  Cardiac: Irregular rhythm, controlled rate, no peripheral edema.   Vascular: Vessel  Right  Left   Radial  Palpable  Palpable   Carotid   audible, without bruit  audible, without bruit   Aorta  Not palpable  N/A   Femoral  Palpable  Palpable   Popliteal  Not palpable  Not palpable   PT  Palpable  Palpable   DP  Palpable  Palpable    Gastrointestinal: soft, NTND, -G/R, - HSM, + mid abdominal incisional hernia that is reducible, - CVAT B.  Musculoskeletal: M/S 5/5 throughout, Extremities without ischemic changes.  Neurologic: Pain and light touch intact in extremities, Motor exam as listed above.  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 Psychiatric: Normal thought content, mood appropriate for clinical situation.     DATA  CTA Abd/Pelvis Duplex (Date: 08/25/17): Aorta: Again noted is an aortic stent graft with transrenal fixation. The aortic stent graft is patent and stable position just below the superior mesenteric artery. There is a very small aneurysm sac measuring roughly 3.0 x 2.4 cm and previously measured 3.0 x 2.2 cm. No evidence for an endoleak. Stable ectasia of the right internal iliac artery measuring 1.3 cm.   08-06-16: carotid duplex suggests less than 40% bilateral internal carotid artery stenosiswhichmay be underestimated due to calcific plaque. Bilateral vertebral artery flow is antegrade.  Bilateral subclavian artery waveforms are normal.  No change since exam of 08-08-15.  08-06-16 renal artery duplex suggests less than 60% right renal artery stenosis (128 cm/s), and greater than 60% left renal artery stenosis (249 cm/s). Technically difficult exam due to bowel gas, scar tissue, and body habitus.  Right kidney size: 11.4 cm; Left kidney size: 11.0 cm. Right renal cyst: 1.1 cm x 1.1 cm No change since exam of 08-08-15.  08-06-16 EVAR  duplex: Sub optimal exam due to bowel gas, scar tissue, and body habitus. 4.4 cm AP sac size, based on limited visualization. (4.4 cm on 08-08-15). Right CIA: 1.2 cm; Left CIA: 1.2 cm   Medical Decision Making  Gabriel Gutierrez is a 82 y.o. male who is followed for renal artery stenosis and is s/p aortobiiliac Cook Zenith stent graft placed by Dr. Kellie Simmering in 2007 for abdominal aortic aneurysm. He also has a history of minimal carotid artery stenosis and atrial fib, takes Xarelto.  He had a CTA abd/pelvis today since the EVAR duplex in March 2018 was suboptimal due to bowel gas, body habitus, and scar tissue. The aortic stent graft is patent and stable position just below the superior mesenteric artery. There is a very small aneurysm sac measuring roughly 3.0 x 2.4 cm and previously measured 3.0 x 2.2 cm. No evidence for an endoleak. Stable ectasia of the right internal iliac artery measuring 1.3 cm.   His blood pressure remains in good control, he has no abdominal or back pain.   He had a stroke October, 2013, has a-fib. for which he is taking Jennye Moccasin, now prescribed by Dr. Acie Fredrickson, his cardiologist.   His left side weakness has resolved.   I discussed with the patient the importance of surveillance of the endograft.  The next endograft duplex will be scheduled for 12 months, also carotid duplex.  The patient will follow up with [place] in 12 months with these studies.  I emphasized the importance of maximal medical management including strict control of blood pressure, blood glucose, and lipid levels, antiplatelet agents, obtaining regular exercise, and cessation of smoking.   Thank you for allowing [place] to participate in this patient's care.  Clemon Chambers, RN, MSN, FNP-C [facility] [place] Office: [facility] Physician: Trula Slade  08/25/2017, 2:22 PM

## 2017-08-25 NOTE — Patient Instructions (Addendum)
Before your next abdominal ultrasound:  Take two Extra-Strength Gas-X capsules at bedtime the night before the test. Take another two Extra-Strength Gas-X capsules 3 hours before the test.  Avoid gas forming foods the day before the test.      Stroke Prevention Some health problems and behaviors may make it more likely for you to have a stroke. Below are ways to lessen your risk of having a stroke.  Be active for at least 30 minutes on most or all days.  Do not smoke. Try not to be around others who smoke.  Do not drink too much alcohol. ? Do not have more than 2 drinks a day if you are a man. ? Do not have more than 1 drink a day if you are a woman and are not pregnant.  Eat healthy foods, such as fruits and vegetables. If you were put on a specific diet, follow the diet as told.  Keep your cholesterol levels under control through diet and medicines. Look for foods that are low in saturated fat, trans fat, cholesterol, and are high in fiber.  If you have diabetes, follow all diet plans and take your medicine as told.  Ask your doctor if you need treatment to lower your blood pressure. If you have high blood pressure (hypertension), follow all diet plans and take your medicine as told by your doctor.  If you are 18-39 years old, have your blood pressure checked every 3-5 years. If you are age 40 or older, have your blood pressure checked every year.  Keep a healthy weight. Eat foods that are low in calories, salt, saturated fat, trans fat, and cholesterol.  Do not take drugs.  Avoid birth control pills, if this applies. Talk to your doctor about the risks of taking birth control pills.  Talk to your doctor if you have sleep problems (sleep apnea).  Take all medicine as told by your doctor. ? You may be told to take aspirin or blood thinner medicine. Take this medicine as told by your doctor. ? Understand your medicine instructions.  Make sure any other conditions you have are  being taken care of.  Get help right away if:  You suddenly lose feeling (you feel numb) or have weakness in your face, arm, or leg.  Your face or eyelid hangs down to one side.  You suddenly feel confused.  You have trouble talking (aphasia) or understanding what people are saying.  You suddenly have trouble seeing in one or both eyes.  You suddenly have trouble walking.  You are dizzy.  You lose your balance or your movements are clumsy (uncoordinated).  You suddenly have a very bad headache and you do not know the cause.  You have new chest pain.  Your heart feels like it is fluttering or skipping a beat (irregular heartbeat). Do not wait to see if the symptoms above go away. Get help right away. Call your local emergency services (911 in [place] Do not drive yourself to the hospital. This information is not intended to replace advice given to you by your health care provider. Make sure you discuss any questions you have with your health care provider. Document Released: 11/05/2011 Document Revised: 10/12/2015 Document Reviewed: 11/06/2012 Elsevier Interactive Patient Education  2018 [employer]    

## 2017-08-27 DIAGNOSIS — J410 Simple chronic bronchitis: Secondary | ICD-10-CM | POA: Diagnosis not present

## 2017-08-27 DIAGNOSIS — R05 Cough: Secondary | ICD-10-CM | POA: Diagnosis not present

## 2017-08-27 DIAGNOSIS — R0602 Shortness of breath: Secondary | ICD-10-CM | POA: Diagnosis not present

## 2017-08-28 DIAGNOSIS — J410 Simple chronic bronchitis: Secondary | ICD-10-CM | POA: Diagnosis not present

## 2017-08-28 DIAGNOSIS — J189 Pneumonia, unspecified organism: Secondary | ICD-10-CM | POA: Diagnosis not present

## 2017-08-28 DIAGNOSIS — J441 Chronic obstructive pulmonary disease with (acute) exacerbation: Secondary | ICD-10-CM | POA: Diagnosis not present

## 2017-09-18 ENCOUNTER — Other Ambulatory Visit: Payer: Self-pay | Admitting: *Deleted

## 2017-09-18 MED ORDER — APIXABAN 5 MG PO TABS: 5.0000 mg | ORAL_TABLET | Freq: Two times a day (BID) | ORAL | 0 refills | Status: DC

## 2017-09-24 ENCOUNTER — Other Ambulatory Visit: Payer: Self-pay | Admitting: Cardiovascular Disease

## 2017-09-24 DIAGNOSIS — R5383 Other fatigue: Secondary | ICD-10-CM | POA: Diagnosis not present

## 2017-09-24 DIAGNOSIS — J158 Pneumonia due to other specified bacteria: Secondary | ICD-10-CM | POA: Diagnosis not present

## 2017-09-24 DIAGNOSIS — R05 Cough: Secondary | ICD-10-CM | POA: Diagnosis not present

## 2017-09-30 ENCOUNTER — Other Ambulatory Visit: Payer: Self-pay | Admitting: Cardiovascular Disease

## 2017-11-24 DIAGNOSIS — L219 Seborrheic dermatitis, unspecified: Secondary | ICD-10-CM | POA: Diagnosis not present

## 2017-12-05 ENCOUNTER — Other Ambulatory Visit: Payer: Self-pay | Admitting: Cardiovascular Disease

## 2017-12-05 MED ORDER — ATORVASTATIN CALCIUM 80 MG PO TABS: 80.0000 mg | ORAL_TABLET | Freq: Every day | ORAL | 2 refills | Status: DC

## 2017-12-05 NOTE — Telephone Encounter (Signed)
Pt's medication was sent to pt's pharmacy as requested. Confirmation received.  °

## 2017-12-15 DIAGNOSIS — D1801 Hemangioma of skin and subcutaneous tissue: Secondary | ICD-10-CM | POA: Diagnosis not present

## 2017-12-15 DIAGNOSIS — L821 Other seborrheic keratosis: Secondary | ICD-10-CM | POA: Diagnosis not present

## 2017-12-15 DIAGNOSIS — L578 Other skin changes due to chronic exposure to nonionizing radiation: Secondary | ICD-10-CM | POA: Diagnosis not present

## 2017-12-15 DIAGNOSIS — L219 Seborrheic dermatitis, unspecified: Secondary | ICD-10-CM | POA: Diagnosis not present

## 2017-12-15 DIAGNOSIS — L570 Actinic keratosis: Secondary | ICD-10-CM | POA: Diagnosis not present

## 2017-12-29 ENCOUNTER — Ambulatory Visit: Payer: Medicare Other | Admitting: Neurology

## 2017-12-30 ENCOUNTER — Ambulatory Visit: Payer: Medicare Other | Admitting: Neurology

## 2017-12-31 ENCOUNTER — Ambulatory Visit: Payer: Medicare Other | Admitting: Neurology

## 2018-01-16 DIAGNOSIS — E782 Mixed hyperlipidemia: Secondary | ICD-10-CM | POA: Diagnosis not present

## 2018-01-16 DIAGNOSIS — E1142 Type 2 diabetes mellitus with diabetic polyneuropathy: Secondary | ICD-10-CM | POA: Diagnosis not present

## 2018-01-16 DIAGNOSIS — N401 Enlarged prostate with lower urinary tract symptoms: Secondary | ICD-10-CM | POA: Diagnosis not present

## 2018-01-16 DIAGNOSIS — E0842 Diabetes mellitus due to underlying condition with diabetic polyneuropathy: Secondary | ICD-10-CM | POA: Diagnosis not present

## 2018-01-16 DIAGNOSIS — N183 Chronic kidney disease, stage 3 (moderate): Secondary | ICD-10-CM | POA: Diagnosis not present

## 2018-01-16 DIAGNOSIS — Z23 Encounter for immunization: Secondary | ICD-10-CM | POA: Diagnosis not present

## 2018-01-16 DIAGNOSIS — I10 Essential (primary) hypertension: Secondary | ICD-10-CM | POA: Diagnosis not present

## 2018-01-16 DIAGNOSIS — E1122 Type 2 diabetes mellitus with diabetic chronic kidney disease: Secondary | ICD-10-CM | POA: Diagnosis not present

## 2018-01-16 DIAGNOSIS — J449 Chronic obstructive pulmonary disease, unspecified: Secondary | ICD-10-CM | POA: Diagnosis not present

## 2018-01-16 DIAGNOSIS — I69392 Facial weakness following cerebral infarction: Secondary | ICD-10-CM | POA: Diagnosis not present

## 2018-01-26 ENCOUNTER — Ambulatory Visit (INDEPENDENT_AMBULATORY_CARE_PROVIDER_SITE_OTHER): Payer: Medicare Other | Admitting: Neurology

## 2018-01-26 ENCOUNTER — Encounter: Payer: Self-pay | Admitting: Neurology

## 2018-01-26 VITALS — BP 122/78 | HR 62 | Ht 66.0 in | Wt 172.0 lb

## 2018-01-26 DIAGNOSIS — I701 Atherosclerosis of renal artery: Secondary | ICD-10-CM

## 2018-01-26 DIAGNOSIS — G3184 Mild cognitive impairment, so stated: Secondary | ICD-10-CM

## 2018-01-26 NOTE — Progress Notes (Signed)
[facility] [address] [place]. [place] [address] [phone]       OFFICE FOLLOW UP VISIT NOTE  [name] Date of Birth:  Dec 22, 1935 Medical Record Number:  [id number]   Referring MD:  Reinaldo Meeker  Reason for Referral:  Vision and memory loss  HPI: 82 year male who had a transient episode of vision loss in the right eye on 01/28/14. He noticed cloudiness which she describes as a horizontal line in his right eye and he could not see below the line the bottom half but he was able to see above it. This lasted only a few minutes and recovered. He denied any accompanying of following headache for seeing zigzag patterns or lines. He is dizzy had a similar episode in November 2014 when he had bilateral vision loss involving the lower half of the field which also lasted only a minute or so. He did not seek any medical help her evaluation in that time period. She denies any chronic headaches, muscle aches, pain, scalp tenderness or jaw claudication. He was seen by me in October 2013 when he had a right parietal insular cortex infarct and was diagnosed with new-onset atrial fibrillation. He is done well from that stroke and recovered completely. He has been on Xarelto of and has been tolerated well without bleeding or bruising. He is in a complain of memory loss particularly not remembering dates and names as well as his hands. He gets frustrated from this and got treated recently. He however remains independent of activities of daily living. His wife handles the finances but the patient can drive and manage his own affairs. He states his blood pressure control has been quite good and his fasting sugars usually run below 120. Update 06/07/2014 : He returns for follow-up after last visit 6 months ago. He continues to do well and has not had a recurrent stroke or TIA symptoms. He saw ophthalmologist Dr. Samara Snide who felt he must have had cholesterol emboli. The patient did  undergo MRI scan of the brain on 02/14/14 which showed no acute abnormality with changes of chronic microvascular ischemia and generalized cerebral atrophy. MRA of the brain and neck did not reveal significant extra or intracranial vascular stenosis. Lab work  On 02/02/14 showed normal vitamin B12, TSH and RPR. Today on the Mini-Mental Status exam score 28/30. He had a colonoscopy yesterday and had held his xarelto for 5 days. He had 3 polyps removed and plans on restarting Xarelto he has not been doing mentally challenging exercises. Update 12/06/2014 : He returns for follow-up after last visit 6 months ago. He is accompanied by his wife feels that his memory may be somewhat better. He is taking Cerefolin but finds it quite expensive. He continues to have trouble remembering mainly recent information the memory from the past seems quite good. He is independent in activities of daily living. There have been no changes in behavior, agitation, hallucinations or nightmares or delusions noted. He continues to be bothered by left knee pain and wears a knee brace. He has not had any recurrent stroke or TIA symptoms and remains on Xarelto which is tolerating well without significant bleeding or bruising. His blood pressure seems well controlled. Lipid profile has not been checked recently. Update 06/20/2015:  He returns for follow-up after last visit 6 months ago. He is accompanied by son. He states is doing well and his memory is quite stable. Acute symptoms are quite active. He does mentally challenging  activities like crossword puzzles. Is physically also quite active. He unfortunately is having severe left knee pain and requires knee replacement which is scheduled for a couple of weeks. He remains on Xarelto which is tolerating well without side effects. He had taken Cerefolin which I had prescribed at last visit but when he ran out of the prescription his did not call for refill. Update 12/20/2015 : He returns for  follow-up after last visit 6 months ago. Is accompanied by his wife. He remained stable from neurovascular standpoint without recurrent TIA or stroke symptoms. He did undergo knee replacement successfully and is back on Xarelto which is tolerating well with only minor bruising but no bleeding. He unfortunately lost his 3-year-old daughter as well as his diet recently. He continues to have short-term memory difficulties and very occasionally word finding as well. He had run out of Cerefolin nac noticed that his cognitive difficulties got worse but now he  is back on it`s generic version and feels better. He did not start Resevetrol as had recommended last time. He does keep himself mentally challenged by taking on building or painting projects.. He states his blood pressure is well controlled and today it is 109/63. He has no new complaints today. Update 12/19/2016 ; he returns for follow-up after last visit a year ago. He is accompanied by his wife. Continues to have mild short-term memory difficulties which appear to be unchanged. He had partial knee replacement done by Dr. Lorre Nick which has gone well. He is concerned about his kidneys but recent lab work from 11/29/16 shows creatinine 1.16 but potassium is elevated at 5.7 for which a follow-up lab work is scheduled for next week. LDLcholesterol was 53 mg percent , triglycerides 104 and total cholesterol 119 mg percent. Patient continues to take care of meta-folbate plus which is generic form of Cerefolin nac  as it is cheaper. He does participate in cognitively challenging activities. He was on Xarelto which was switched to eliquis due to concerns about renal insufficiency 6 months ago. He is tolerating it well without bruising or bleeding. Has not had any recurrent TIA or stroke symptoms since 2013. He has no new complaints today. His blood pressure is well controlled today at 124/70. He does have a very strong family history of Alzheimer's and is worried about  developing it himself Update 01/26/2018 : she returns for follow-up after last visit a year ago. Is accompanied by his son. He continues to do well from neurovascular standpoint without recurrent TIA or stroke symptoms. His memory difficulties also appear to be unchanged. He has had no recent changes. He remains on eliquis which is tolerating well without bleeding and only minor bruising. Is tolerating Lipitor well without any side effects. His sugar and blood pressure both symptoms are well controlled. He has no new complaints today. On Mini-Mental status exam testing this could 29/30 which is stable from last visit. Update 01/26/2018 : he returns for follow-up after last visit a year ago. He is accompanied by his son. He continues to do well and has not had recurrent TIA or stroke symptoms. His short-term memory difficulties also continued to be unchanged. He has trouble remembering recent information. He however remains quite independent in activities of daily living. On the Mini-Mental status exam score today scored 29/30 with only one deficit in attention. He was able to name 20 animals with food legs. He was able to copy intersecting pentagons. He has no new complaints today. ROS:   14 system  review of systems is positive for hearing loss,  , memory loss ,  and all other systems negative  PMH:  Past Medical History:  Diagnosis Date  . AAA (abdominal aortic aneurysm) ([place])   . Arthritis   . Atrial fibrillation ([place])   . Coronary artery disease    post CABG x5 --Severe three-vessel coronary disease  . Diabetes mellitus Age 79  . Dysrhythmia   . Hyperlipidemia   . Hypertension   . Irregular heart beat  Feb 18, 2012  . Renal artery stenosis ([place])   . Stroke [facility]) Oct. 1, 2013   Mini stroke- Afib    Social History:  Social History   Socioeconomic History  . Marital status: Married    Spouse name: Not on file  . Number of children: Not on file  . Years of education: Not on file  . Highest  education level: Not on file  Occupational History  . Not on file  Social Needs  . Financial resource strain: Not on file  . Food insecurity:    Worry: Not on file    Inability: Not on file  . Transportation needs:    Medical: Not on file    Non-medical: Not on file  Tobacco Use  . Smoking status: Former Smoker    Packs/day: 2.00    Years: 44.00    Pack years: 88.00    Types: Cigarettes    Last attempt to quit: 05/20/1988    Years since quitting: 29.7  . Smokeless tobacco: Former Systems developer    Quit date: 02/11/2003  Substance and Sexual Activity  . Alcohol use: No    Alcohol/week: 0.0 standard drinks  . Drug use: No  . Sexual activity: Never  Lifestyle  . Physical activity:    Days per week: Not on file    Minutes per session: Not on file  . Stress: Not on file  Relationships  . Social connections:    Talks on phone: Not on file    Gets together: Not on file    Attends religious service: Not on file    Active member of club or organization: Not on file    Attends meetings of clubs or organizations: Not on file    Relationship status: Not on file  . Intimate partner violence:    Fear of current or ex partner: Not on file    Emotionally abused: Not on file    Physically abused: Not on file    Forced sexual activity: Not on file  Other Topics Concern  . Not on file  Social History Narrative  . Not on file    Medications:   Current Outpatient Medications on File Prior to Visit  Medication Sig Dispense Refill  . apixaban (ELIQUIS) 5 MG TABS tablet Take 1 tablet (5 mg total) by mouth 2 (two) times daily. 10 tablet 0  . atorvastatin (LIPITOR) 80 MG tablet Take 1 tablet (80 mg total) by mouth daily. 90 tablet 2  . diltiazem (CARDIZEM CD) 120 MG 24 hr capsule TAKE 1 CAPSULE DAILY 90 capsule 3  . ELIQUIS 5 MG TABS tablet TAKE 1 TABLET TWICE A DAY 180 tablet 1  . metFORMIN (GLUCOPHAGE) 500 MG tablet Take 500 mg by mouth 2 (two) times daily with a meal.      .  METHYLFOL-METHYLCOB-ACETYLCYST PO Take 1 tablet by mouth daily.    . metoprolol tartrate (LOPRESSOR) 25 MG tablet Take 25 mg by mouth daily.    . tamsulosin (FLOMAX)  0.4 MG CAPS capsule Take 0.4 mg by mouth daily.      No current facility-administered medications on file prior to visit.     Allergies:  No Known Allergies  Physical Exam General: well developed, well nourished elderly male, seated, in no evident distress Head: head normocephalic and atraumatic.  Neck: supple with no carotid or supraclavicular bruits Cardiovascular: regular rate and rhythm, no murmurs Musculoskeletal: no deformity Skin:  no rash/petichiae Vascular:  Normal pulses all extremities Vitals:   01/26/18 1244  BP: 122/78  Pulse: 62    Neurologic Exam Mental Status: Awake and fully alert. Oriented to place and time. Recent and remote memory diminished. Attention span, concentration and fund of knowledge appropriate. Mood and affect appropriate. Mini-Mental status exam 29/30. Recall 2/3. AFT 20.[name] Clock drawing 4/4.   Cranial Nerves: Fundoscopic exam not done . Pupils equal, briskly reactive to light. Extraocular movements full without nystagmus. Visual fields full to confrontation. Hearing diminished bilaterally. Facial sensation intact. Face, tongue, palate moves normally and symmetrically.  Motor: Normal bulk and tone. Normal strength in all tested extremity muscles. Sensory.: intact to touch and pinprick and vibratory sensation.  Coordination: Rapid alternating movements normal in all extremities. Finger-to-nose and heel-to-shin performed accurately bilaterally. Gait and Station: Arises from chair without difficulty. Stance is normal. Gait demonstrates normal stride length and balance . Unable to heel, toe and tandem walk without difficulty.  Reflexes: 1+ and symmetric. Toes downgoing.     ASSESSMENT: 82 year male with transient episodes of visual loss of unclear etiology possibly small vessel retinal TIAs.  He complains of memory and cognitive difficulties likely from mild cognitive impairment but strong family history of Alzheimer's and is concerning. Remote history of embolic right MCA branch infarct in October 2013 from atrial fibrillation with good neurological recovery    PLAN: I had a long discussion with the patient and his son regarding his remote TIAs as well as mild cognitive impairment both of which appear to be stable at the present time. I do not believe any further diagnostic imaging on medication changes indicated at the present time. He will continue eliquis for stroke prevention given his atrial fibrillation and maintain strict control of diabetes with hemoglobin A1c goal below 6.5, lipids with LDL cholesterol goal below 70 mg percent and hypertension with blood pressure goal below 130/90. He was encouraged to eat a healthy diet, be active and also to correspond in mentally challenging activities like solving crossword puzzles, playing sudoku and bridge. He will return for follow-up in a year or call earlier if necessary.Greater than 50% time during this 25 minute visit was spent on counseling and coordination of care upon mild cognitive impairment and stroke risk He will return for follow-up in one year or call earlier if necessary  Antony Contras, MD Note: This document was prepared with digital dictation and possible smart phrase technology. Any transcriptional errors that result from this process are unintentional.

## 2018-01-26 NOTE — Patient Instructions (Signed)
I had a long discussion with the patient and his son regarding his remote TIAs as well as mild cognitive impairment both of which appear to be stable at the present time. I do not believe any further diagnostic imaging on medication changes indicated at the present time. He will continue eliquis for stroke prevention given his atrial fibrillation and maintain strict control of diabetes with hemoglobin A1c goal below 6.5, lipids with LDL cholesterol goal below 70 mg percent and hypertension with blood pressure goal below 130/90. He was encouraged to eat a healthy diet, be active and also to correspond in mentally challenging activities like solving crossword puzzles, playing sudoku and bridge. He will return for follow-up in a year or call earlier if necessary

## 2018-02-19 ENCOUNTER — Other Ambulatory Visit: Payer: Self-pay | Admitting: Neurology

## 2018-03-26 ENCOUNTER — Other Ambulatory Visit: Payer: Self-pay | Admitting: Cardiovascular Disease

## 2018-03-26 NOTE — Telephone Encounter (Signed)
Pt last saw Dr Acie Fredrickson 07/18/17, last labs 07/18/17 Cr 1.14, age 82, weight 78kg, based on specified criteria pt is on appropriate dosage of Eliquis 5mg  BID.  Will refill rx.

## 2018-05-03 DIAGNOSIS — R040 Epistaxis: Secondary | ICD-10-CM | POA: Diagnosis not present

## 2018-05-04 DIAGNOSIS — Z6827 Body mass index (BMI) 27.0-27.9, adult: Secondary | ICD-10-CM | POA: Diagnosis not present

## 2018-05-04 DIAGNOSIS — Z0000 Encounter for general adult medical examination without abnormal findings: Secondary | ICD-10-CM | POA: Diagnosis not present

## 2018-05-21 DIAGNOSIS — N183 Chronic kidney disease, stage 3 (moderate): Secondary | ICD-10-CM | POA: Diagnosis not present

## 2018-05-21 DIAGNOSIS — E1142 Type 2 diabetes mellitus with diabetic polyneuropathy: Secondary | ICD-10-CM | POA: Diagnosis not present

## 2018-05-21 DIAGNOSIS — I10 Essential (primary) hypertension: Secondary | ICD-10-CM | POA: Diagnosis not present

## 2018-05-21 DIAGNOSIS — H906 Mixed conductive and sensorineural hearing loss, bilateral: Secondary | ICD-10-CM | POA: Diagnosis not present

## 2018-05-21 DIAGNOSIS — Z6828 Body mass index (BMI) 28.0-28.9, adult: Secondary | ICD-10-CM | POA: Diagnosis not present

## 2018-05-21 DIAGNOSIS — I69392 Facial weakness following cerebral infarction: Secondary | ICD-10-CM | POA: Diagnosis not present

## 2018-05-21 DIAGNOSIS — I482 Chronic atrial fibrillation, unspecified: Secondary | ICD-10-CM | POA: Diagnosis not present

## 2018-05-21 DIAGNOSIS — E782 Mixed hyperlipidemia: Secondary | ICD-10-CM | POA: Diagnosis not present

## 2018-05-21 DIAGNOSIS — E1122 Type 2 diabetes mellitus with diabetic chronic kidney disease: Secondary | ICD-10-CM | POA: Diagnosis not present

## 2018-06-05 DIAGNOSIS — N183 Chronic kidney disease, stage 3 (moderate): Secondary | ICD-10-CM | POA: Diagnosis not present

## 2018-06-05 DIAGNOSIS — I129 Hypertensive chronic kidney disease with stage 1 through stage 4 chronic kidney disease, or unspecified chronic kidney disease: Secondary | ICD-10-CM | POA: Diagnosis not present

## 2018-06-05 DIAGNOSIS — E875 Hyperkalemia: Secondary | ICD-10-CM | POA: Diagnosis not present

## 2018-07-27 ENCOUNTER — Encounter: Payer: Self-pay | Admitting: Cardiovascular Disease

## 2018-07-27 ENCOUNTER — Ambulatory Visit (INDEPENDENT_AMBULATORY_CARE_PROVIDER_SITE_OTHER): Payer: Medicare Other | Admitting: Cardiovascular Disease

## 2018-07-27 VITALS — BP 124/62 | HR 65 | Ht 66.0 in | Wt 173.1 lb

## 2018-07-27 DIAGNOSIS — I482 Chronic atrial fibrillation, unspecified: Secondary | ICD-10-CM | POA: Diagnosis not present

## 2018-07-27 DIAGNOSIS — I10 Essential (primary) hypertension: Secondary | ICD-10-CM | POA: Diagnosis not present

## 2018-07-27 DIAGNOSIS — I6523 Occlusion and stenosis of bilateral carotid arteries: Secondary | ICD-10-CM | POA: Diagnosis not present

## 2018-07-27 DIAGNOSIS — I2510 Atherosclerotic heart disease of native coronary artery without angina pectoris: Secondary | ICD-10-CM | POA: Diagnosis not present

## 2018-07-27 NOTE — Progress Notes (Signed)
Gabriel Gutierrez Date of Birth  [birth year] [facility] [address]    [place], [place]  [address] [phone]  Fax  [phone]  Problem list: 1. Coronary artery disease-status post CABG 2. Abdominal aortic aneurysm-Status post stent grafting 3. Diabetes mellitus 4. Dyslipidemia 5. Hypertension 6. Atrial fibrillation -  We have tried cardioversion in the past but it lasted only 1 week.  7. History of stroke   Previous Notes:  Gabriel Gutierrez is a 83 yo gentleman with a history of coronary artery disease-status post coronary artery bypass grafting in 2007. He has a history of hypertension.   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    He was recently admitted to the hospital with an acute stroke. He was also found to have rapid atrial fibrillation. He stroke symptoms resolve fairly quickly and he does not have much of the deficit. He remains in atrial fibrillation.  He's been maintained on Xarelto.  He's completely asymptomatic from a cardiac standpoint. He cannot tell that his heart rate is beating irregularly.  Dec. 19, 2013 He had a cardioversion performed last month by Dr. Irish Lack who was kind enough to perform the cardioversion after I got tied up in the office. He is completely asymptomatic. He cannot tell whether his heart is in atrial fibrillation or normal sinus rhythm. He has been able to do all of his normal activities without difficulty.  August 10, 2012:  Gabriel Gutierrez is doing well.  His potassium levels have been running high. He had blood checked at his medical doctors office in February and his potassium level was 5.8. He discontinued the benazepril / HCT. Recheck potassium was even higher. He had it checked again this week with results are pending.  He is cutting out all high potassium foods.   He had a cardioversion in   Dec. 11,  2014:  Gabriel Gutierrez is doing well.  Active , works hard every day, no CP.  No dyspnea.  Sleeping well at night.  Completely asymptomatic from an a-fib standpoint.   October 27, 2013: Gabriel Gutierrez is doing ok.    Not able to do as much as he would like.  Still very active - plays golf,  Walking some .  Has some knee problems that limits him.    Dec. 7, 2015: Gabriel Gutierrez is seen today for follow up of his CAD, AAA repair, atrial fib., and dyslipidemia.  He also has a hx of DM and a CVA.   November 08, 2014:  Doing well from a cardiac standpoint. Trouble with knees.  Has had a stroke - on xarelto .  Had an occular stroke  Jan. 23. 2017 Gabriel Gutierrez is doing well from a cardiac standpoint. Needs partial replacement of the left knee Doing great.  No CP,  Has some DOE with significant exertion. No DOE doing regular chores  Jan. 29, 2018: No cardiac issues over the past year.  Has some left temple pain .  BP and HR are normal  July 18, 2017 Seen with granddaughter, Gabriel Gutierrez (cardiac nurse [facility] )  Staying active.  Is slowing down.   Just getting over pneumonia.  Gets lots of yard work.   Plays golf regularly  No CP or dypsena ( was short of breath with pneumonia )   July 27, 2018:  Gabriel Gutierrez is seen with  wife, Gabriel Gutierrez today  Has some fatigue,  No CP , mild dyspena  has had some nose bleeds occurs after blowing his nose  He saw a nephrologist in January.  His creatinine was 1.15 at that time.  Hemoglobin was 13.9.  LDL was 64.  HDL is 45.  Total cholesterol is 136.  Triglycerides are 104.  Current Outpatient Medications on File Prior to Visit  Medication Sig Dispense Refill  . atorvastatin (LIPITOR) 80 MG tablet Take 1 tablet (80 mg total) by mouth daily. 90 tablet 2  . diltiazem (CARDIZEM CD) 120 MG 24 hr capsule TAKE 1 CAPSULE DAILY 90 capsule 3  . ELIQUIS 5 MG TABS tablet TAKE 1 TABLET TWICE A DAY 180 tablet 1  . metFORMIN (GLUCOPHAGE) 500 MG tablet Take 500 mg by mouth 2 (two) times daily with a meal.       . Methylfol-Methylcob-Acetylcyst (L-METHYL-MC NAC) 6-2-600 MG TABS TAKE 1 TABLET EVERY MORNING. 90 tablet 3  . METHYLFOL-METHYLCOB-ACETYLCYST PO Take 1 tablet by mouth daily.    . metoprolol tartrate (LOPRESSOR) 25 MG tablet Take 25 mg by mouth daily.    . tamsulosin (FLOMAX) 0.4 MG CAPS capsule Take 0.4 mg by mouth daily.      No current facility-administered medications on file prior to visit.     No Known Allergies  Past Medical History:  Diagnosis Date  . AAA (abdominal aortic aneurysm) ([place])   . Arthritis   . Atrial fibrillation ([place])   . Coronary artery disease    post CABG x5 --Severe three-vessel coronary disease  . Diabetes mellitus Age 68  . Dysrhythmia   . Hyperlipidemia   . Hypertension   . Irregular heart beat  Feb 18, 2012  . Renal artery stenosis ([place])   . Stroke [facility]) Oct. 1, 2013   Mini stroke- Afib    Past Surgical History:  Procedure Laterality Date  . cabgx5    . CARDIAC CATHETERIZATION  08/26/2005   Est. EF of 65-70% -- Severe three-vessel coronary artery disease -- very heavily calcified vessels and really there is no role for angioplasty  -- he heavily calcified vessels and really there is no role for angioplasty abdominal aortic aneurysm surgery  . CARDIOVERSION  04/15/2012   Procedure: CARDIOVERSION;  Surgeon: Thayer Headings, MD;  Location: [place];  Service: Cardiovascular;  Laterality: N/A;  . CATARACT EXTRACTION     left eye  . COLON SURGERY     ruptured colon  . CORONARY ARTERY BYPASS GRAFT  09/02/2005   x 5 -- left internal mammary artery graft to the left anterior descending coronary artery, with a saphenous vein graft to the diagonal branch of the LAD, a sequential saphenous vein graft to the second and fourth obtuse marginal branches of the left circumflex coronary artery, and a saphenous vein graft to the posterior descending branch of RCA  -- Endoscopic vein harvesting from the left leg  . EYE SURGERY Left    Cataract  . EYE SURGERY  Right    Cataract  . HAND SURGERY  1997  . LAPAROTOMY  1998  . MEDIAN STERNOTOMY    . PARTIAL KNEE ARTHROPLASTY Left 07/17/2015   Procedure: UNICOMPARTMENTAL KNEE;  Surgeon: Vickey Huger, MD;  Location: [place];  Service: Orthopedics;  Laterality: Left;  . PR VEIN BYPASS GRAFT,AORTO-FEM-POP  12/23/2005   Infrarenal abdominal aortic aneurysm, right common iliac occlusive disease --   . SKIN GRAFT  1992    Social History   Tobacco Use  Smoking Status Former Smoker  . Packs/day: 2.00  . Years: 44.00  . Pack years: 88.00  . Types: Cigarettes  . Last attempt to quit: 05/20/1988  . Years since quitting: 30.2  Smokeless Tobacco Former Systems developer  . Quit date: 02/11/2003    Social History   Substance and Sexual Activity  Alcohol Use No  . Alcohol/week: 0.0 standard drinks    Family History  Problem Relation Age of Onset  . Coronary artery disease Mother   . Heart disease Mother        After age 38-Carotid  . Hyperlipidemia Mother   . Stroke Mother   . Coronary artery disease Father   . Hyperlipidemia Father   . Hypertension Father   . Varicose Veins Father   . Heart attack Father   . Coronary artery disease Sister   . Heart disease Sister        After age 75- BP  . Heart attack Sister   . Diabetes Brother   . Heart disease Brother        After 33 yrs of age  . Heart attack Brother   . Hyperlipidemia Brother   . Alzheimer's disease Sister   . Seizures Sister     Reviw of Systems:  Reviewed in the HPI.  All other systems are negative.  Physical Exam: Blood pressure 124/62, pulse 65, height 5\' 6"  (1.676 m), weight 173 lb 1.9 oz (78.5 kg), SpO2 97 %.  GEN:   Elderly male,  NAD  HEENT: Normal NECK: No JVD; No carotid bruits LYMPHATICS: No lymphadenopathy CARDIAC:  Irreg. Irreg.  RESPIRATORY:  Clear to auscultation without rales, wheezing or rhonchi  ABDOMEN: Soft, non-tender, non-distended MUSCULOSKELETAL:  No edema; No deformity  SKIN: Warm and dry NEUROLOGIC:  Alert  and oriented x 3   ECG: July 27, 2018: Atrial fibrillation with a heart rate of 65.  Nonspecific ST and T wave changes.. No changes from previous EKG.   Assessment / Plan:   1. Coronary artery disease-status post CABG -   No angina .   Recent lipids look good   2. Abdominal aortic aneurysm-   S/p aortic stent grafting  3. Diabetes mellitus 4. Dyslipidemia -   Recent labs from primary MD look good.    5. Hypertension -    He had to stop his ARB because of hyperkalemia .    6. Atrial fibrillation -   Chronic.  HR is well controlled.  On Eliquis  Check labs today - CBC, BMP   7. History of stroke   Mertie Moores, MD  07/27/2018 11:41 AM    [address],  [place], [place]  [address] Pager [phone] Phone: [phone]; Fax: [phone]

## 2018-07-27 NOTE — Patient Instructions (Signed)

## 2018-08-12 ENCOUNTER — Other Ambulatory Visit: Payer: Self-pay | Admitting: Cardiovascular Disease

## 2018-08-12 NOTE — Telephone Encounter (Signed)
Pt last saw Dr Acie Fredrickson 07/27/18, last labs 06/05/18 Creat 1.15 at [place], age 83, weight 78.5kg, based on specified criteria pt is on appropriate dosage of Eliquis 5mg  BID.  Will refill rx.

## 2018-09-01 ENCOUNTER — Other Ambulatory Visit: Payer: Self-pay | Admitting: Cardiovascular Disease

## 2018-09-25 DIAGNOSIS — N183 Chronic kidney disease, stage 3 (moderate): Secondary | ICD-10-CM | POA: Diagnosis not present

## 2018-09-25 DIAGNOSIS — E1142 Type 2 diabetes mellitus with diabetic polyneuropathy: Secondary | ICD-10-CM | POA: Diagnosis not present

## 2018-09-25 DIAGNOSIS — Z1159 Encounter for screening for other viral diseases: Secondary | ICD-10-CM | POA: Diagnosis not present

## 2018-09-25 DIAGNOSIS — E782 Mixed hyperlipidemia: Secondary | ICD-10-CM | POA: Diagnosis not present

## 2018-09-25 DIAGNOSIS — Z6827 Body mass index (BMI) 27.0-27.9, adult: Secondary | ICD-10-CM | POA: Diagnosis not present

## 2018-09-25 DIAGNOSIS — E1122 Type 2 diabetes mellitus with diabetic chronic kidney disease: Secondary | ICD-10-CM | POA: Diagnosis not present

## 2018-09-25 DIAGNOSIS — H906 Mixed conductive and sensorineural hearing loss, bilateral: Secondary | ICD-10-CM | POA: Diagnosis not present

## 2018-09-25 DIAGNOSIS — I10 Essential (primary) hypertension: Secondary | ICD-10-CM | POA: Diagnosis not present

## 2018-09-25 DIAGNOSIS — J449 Chronic obstructive pulmonary disease, unspecified: Secondary | ICD-10-CM | POA: Diagnosis not present

## 2018-09-25 DIAGNOSIS — N401 Enlarged prostate with lower urinary tract symptoms: Secondary | ICD-10-CM | POA: Diagnosis not present

## 2018-09-25 DIAGNOSIS — I69392 Facial weakness following cerebral infarction: Secondary | ICD-10-CM | POA: Diagnosis not present

## 2018-09-25 DIAGNOSIS — I482 Chronic atrial fibrillation, unspecified: Secondary | ICD-10-CM | POA: Diagnosis not present

## 2019-01-26 DIAGNOSIS — E782 Mixed hyperlipidemia: Secondary | ICD-10-CM | POA: Diagnosis not present

## 2019-01-26 DIAGNOSIS — Z6827 Body mass index (BMI) 27.0-27.9, adult: Secondary | ICD-10-CM | POA: Diagnosis not present

## 2019-01-26 DIAGNOSIS — I482 Chronic atrial fibrillation, unspecified: Secondary | ICD-10-CM | POA: Diagnosis not present

## 2019-01-26 DIAGNOSIS — E1122 Type 2 diabetes mellitus with diabetic chronic kidney disease: Secondary | ICD-10-CM | POA: Diagnosis not present

## 2019-01-26 DIAGNOSIS — I10 Essential (primary) hypertension: Secondary | ICD-10-CM | POA: Diagnosis not present

## 2019-01-26 DIAGNOSIS — Z1159 Encounter for screening for other viral diseases: Secondary | ICD-10-CM | POA: Diagnosis not present

## 2019-01-26 DIAGNOSIS — H906 Mixed conductive and sensorineural hearing loss, bilateral: Secondary | ICD-10-CM | POA: Diagnosis not present

## 2019-01-26 DIAGNOSIS — E1142 Type 2 diabetes mellitus with diabetic polyneuropathy: Secondary | ICD-10-CM | POA: Diagnosis not present

## 2019-02-02 ENCOUNTER — Other Ambulatory Visit: Payer: Self-pay

## 2019-02-02 DIAGNOSIS — I714 Abdominal aortic aneurysm, without rupture, unspecified: Secondary | ICD-10-CM

## 2019-02-02 DIAGNOSIS — I6529 Occlusion and stenosis of unspecified carotid artery: Secondary | ICD-10-CM

## 2019-02-04 ENCOUNTER — Ambulatory Visit (INDEPENDENT_AMBULATORY_CARE_PROVIDER_SITE_OTHER)
Admission: RE | Admit: 2019-02-04 | Discharge: 2019-02-04 | Disposition: A | Discharge status: Home / Self Care | Payer: Medicare Other | Source: Ambulatory Visit | Attending: Family | Admitting: Family

## 2019-02-04 ENCOUNTER — Encounter: Payer: Self-pay | Admitting: Family

## 2019-02-04 ENCOUNTER — Ambulatory Visit (HOSPITAL_COMMUNITY)
Admission: RE | Admit: 2019-02-04 | Discharge: 2019-02-04 | Disposition: A | Discharge status: Home / Self Care | Payer: Medicare Other | Source: Ambulatory Visit | Attending: Family | Admitting: Family

## 2019-02-04 ENCOUNTER — Ambulatory Visit (INDEPENDENT_AMBULATORY_CARE_PROVIDER_SITE_OTHER): Payer: Medicare Other | Admitting: Family

## 2019-02-04 ENCOUNTER — Other Ambulatory Visit: Payer: Self-pay

## 2019-02-04 VITALS — BP 130/76 | HR 80 | Temp 98.0°F | Resp 14 | Ht 67.0 in | Wt 168.0 lb

## 2019-02-04 DIAGNOSIS — I6529 Occlusion and stenosis of unspecified carotid artery: Secondary | ICD-10-CM

## 2019-02-04 DIAGNOSIS — I714 Abdominal aortic aneurysm, without rupture, unspecified: Secondary | ICD-10-CM

## 2019-02-04 DIAGNOSIS — Z95828 Presence of other vascular implants and grafts: Secondary | ICD-10-CM | POA: Diagnosis not present

## 2019-02-04 DIAGNOSIS — I6523 Occlusion and stenosis of bilateral carotid arteries: Secondary | ICD-10-CM | POA: Diagnosis not present

## 2019-02-04 DIAGNOSIS — Z8673 Personal history of transient ischemic attack (TIA), and cerebral infarction without residual deficits: Secondary | ICD-10-CM | POA: Diagnosis not present

## 2019-02-04 NOTE — Patient Instructions (Signed)
Before your next abdominal ultrasound:  Avoid gas forming foods and beverages the day before the test.   Take two Extra-Strength Gas-X capsules at bedtime the night before the test. Take another two Extra-Strength Gas-X capsules in the middle of the night if you get up to the restroom, if not, first thing in the morning with water.  Do not chew gum.     

## 2019-02-04 NOTE — Progress Notes (Signed)
[facility] [place]  CC: Follow up s/p Endovascular Repair of Abdominal Aortic Aneurysm    History of Present Illness  Gabriel Gutierrez is a 83 y.o. (November 08, 1935) male who is s/p aortobiiliac Cook Zenith stent graft placed by Dr. Kellie Simmering in 2007 for abdominal aortic aneurysm.  He had a stroke October, 2013, has a-fib. for which he is taking Eliquis, prescribed by Dr. Acie Fredrickson, his cardiologist.  His left side weakness has resolved.  He denies back or abdominal pain.  The patient's blood pressure has been stable. The patient's blood pressure medication regimen has remained stable. The patient's urinary history has remained stable, he does seem to have BPH, Flomax helps.   He had a partial left knee replacement in March 2017. He does not have claudication symptoms with walking, denies non healing wounds.  Diabletes: last A1C result on file was 6.3 on 01-26-19 (pt brought lab results with him from Dr. Reinaldo Meeker, his PCP).  Tobacco use: former smoker, quit in 1990  Pt meds include: Statin :Yes Betablocker: Yes ASA: No Other anticoagulants/antiplatelets: Eliqiuis for atrial fib   Past Medical History:  Diagnosis Date  . AAA (abdominal aortic aneurysm) ([place])   . Arthritis   . Atrial fibrillation ([place])   . Coronary artery disease    post CABG x5 --Severe three-vessel coronary disease  . Diabetes mellitus Age 63  . Dysrhythmia   . Hyperlipidemia   . Hypertension   . Irregular heart beat  Feb 18, 2012  . Renal artery stenosis ([place])   . Stroke [facility]) Oct. 1, 2013   Mini stroke- Afib   Past Surgical History:  Procedure Laterality Date  . cabgx5    . CARDIAC CATHETERIZATION  08/26/2005   Est. EF of 65-70% -- Severe three-vessel coronary artery disease -- very heavily calcified vessels and really there is no role for angioplasty  -- he heavily calcified vessels and really there is no role for angioplasty abdominal aortic aneurysm surgery  .  CARDIOVERSION  04/15/2012   Procedure: CARDIOVERSION;  Surgeon: Thayer Headings, MD;  Location: [place];  Service: Cardiovascular;  Laterality: N/A;  . CATARACT EXTRACTION     left eye  . COLON SURGERY     ruptured colon  . CORONARY ARTERY BYPASS GRAFT  09/02/2005   x 5 -- left internal mammary artery graft to the left anterior descending coronary artery, with a saphenous vein graft to the diagonal branch of the LAD, a sequential saphenous vein graft to the second and fourth obtuse marginal branches of the left circumflex coronary artery, and a saphenous vein graft to the posterior descending branch of RCA  -- Endoscopic vein harvesting from the left leg  . EYE SURGERY Left    Cataract  . EYE SURGERY Right    Cataract  . HAND SURGERY  1997  . LAPAROTOMY  1998  . MEDIAN STERNOTOMY    . PARTIAL KNEE ARTHROPLASTY Left 07/17/2015   Procedure: UNICOMPARTMENTAL KNEE;  Surgeon: Vickey Huger, MD;  Location: [place];  Service: Orthopedics;  Laterality: Left;  . PR VEIN BYPASS GRAFT,AORTO-FEM-POP  12/23/2005   Infrarenal abdominal aortic aneurysm, right common iliac occlusive disease --   . SKIN GRAFT  1992   Social History Social History   Tobacco Use  . Smoking status: Former Smoker    Packs/day: 2.00    Years: 44.00    Pack years: 88.00    Types: Cigarettes    Quit date: 05/20/1988    Years  since quitting: 30.7  . Smokeless tobacco: Former Systems developer    Quit date: 02/11/2003  Substance Use Topics  . Alcohol use: No    Alcohol/week: 0.0 standard drinks  . Drug use: No   Family History Family History  Problem Relation Age of Onset  . Coronary artery disease Mother   . Heart disease Mother        After age 41-Carotid  . Hyperlipidemia Mother   . Stroke Mother   . Coronary artery disease Father   . Hyperlipidemia Father   . Hypertension Father   . Varicose Veins Father   . Heart attack Father   . Coronary artery disease Sister   . Heart disease Sister        After age 52- BP  .  Heart attack Sister   . Diabetes Brother   . Heart disease Brother        After 32 yrs of age  . Heart attack Brother   . Hyperlipidemia Brother   . Alzheimer's disease Sister   . Seizures Sister    Current Outpatient Medications on File Prior to Visit  Medication Sig Dispense Refill  . atorvastatin (LIPITOR) 80 MG tablet TAKE 1 TABLET DAILY 90 tablet 3  . diltiazem (CARDIZEM CD) 120 MG 24 hr capsule TAKE 1 CAPSULE DAILY 90 capsule 3  . ELIQUIS 5 MG TABS tablet TAKE 1 TABLET TWICE A DAY 180 tablet 2  . metFORMIN (GLUCOPHAGE) 500 MG tablet Take 500 mg by mouth 2 (two) times daily with a meal.      . Methylfol-Methylcob-Acetylcyst (L-METHYL-MC NAC) 6-2-600 MG TABS TAKE 1 TABLET EVERY MORNING. 90 tablet 3  . metoprolol tartrate (LOPRESSOR) 25 MG tablet Take 25 mg by mouth daily.    . tamsulosin (FLOMAX) 0.4 MG CAPS capsule Take 0.4 mg by mouth daily.      No current facility-administered medications on file prior to visit.    No Known Allergies   ROS: See HPI for pertinent positives and negatives.  Physical Examination  Vitals:   02/04/19 1010  BP: 130/76  Pulse: 80  Resp: 14  Temp: 98 F (36.7 C)  TempSrc: Temporal  SpO2: 98%  Weight: 168 lb (76.2 kg)  Height: 5\' 7"  (1.702 m)   Body mass index is 26.31 kg/m.  General: A&O x 3, WD, well appearing male HEENT: No gross abnormalities  Pulmonary: Sym exp, respirations are non labored, good air movement in all fields CTAB, no rales, rhonchi, or wheezes Cardiac: Iregular rhythm with controlled rate, no murmur appreciated  Vascular: Vessel Right Left  Radial Palpable Palpable  Carotid  without bruit  without bruit  Aorta Not palpable N/A  Popliteal Not palpable Not palpable  PT Palpable Palpable  DP Not Palpable Not Palpable   Gastrointestinal: soft, NTND, -G/R, - HSM, - palpable masses, - CVAT B. Musculoskeletal: M/S 5/5 throughout, extremities without ischemic changes  Skin: No rashes, no ulcers, no cellulitis.    Neurologic: Pain and light touch intact in extremities, Motor exam as listed above. Psychiatric: Normal thought content, mood appropriate for clinical situation.     DATA  Carotid Duplex (02-04-19): Right Carotid: Velocities in the right ICA are consistent with a 1-39% stenosis.                Non-hemodynamically significant plaque <50% noted in the CCA. The                ECA appears <50% stenosed. Left Carotid: Velocities in the left  ICA are consistent with a 1-39% stenosis.               Non-hemodynamically significant plaque <50% noted in the CCA. The               ECA appears <50% stenosed. Vertebrals:  Bilateral vertebral arteries demonstrate antegrade flow. Subclavians: Right subclavian artery flow was disturbed. Normal flow hemodynamics were seen in the left subclavian artery.   EVAR Duplex   Current (Date: 02-04-19) Endovascular Aortic Repair (EVAR): +----------+----------------+-------------------+-------------------+           Diameter AP (cm)Diameter Trans (cm)Velocities (cm/sec) +----------+----------------+-------------------+-------------------+ Aorta     3.07            3.09               60                  +----------+----------------+-------------------+-------------------+ Right Limb1.43            1.34               56                  +----------+----------------+-------------------+-------------------+ Left Limb 1.43            1.92               44                  +----------+----------------+-------------------+-------------------+ Summary: Abdominal Aorta: The largest aortic measurement is 3.1 cm. Patent endovascular aneurysm repair with no evidence of endoleak. The largest aortic diameter remains essentially unchanged compared to prior exam. Previous diameter measurement was 3.0 cm obtained on 08/25/2017 via CT.   Medical Decision Making  Gabriel Gutierrez is a 82 y.o. male who is s/p aortobiiliac Cook Zenith stent graft placed by  Dr. Kellie Simmering in 2007 for abdominal aortic aneurysm.   Pt is asymptomatic with stable sac size at 3 cm. He had a stroke October, 2013, has a-fib. for which he is taking Eliquis, prescribed by Dr. Acie Fredrickson, his cardiologist.  His left side weakness has resolved. Carotid duplex today shows 1-39% stenosis of the bilateral ICA.   I discussed with the patient the importance of surveillance of the endograft.  The next endograft duplex will be scheduled for 18 months.  The patient will follow up with [place] in 18 months with these studies.  Carotid duplex in approximately 2 years.   I emphasized the importance of maximal medical management including strict control of blood pressure, blood glucose, and lipid levels, antiplatelet agents, obtaining regular exercise, and cessation of smoking.   Thank you for allowing [place] to participate in this patient's care.  Clemon Chambers, RN, MSN, FNP-C [facility] [place] Office: [phone]  Clinic Physician: Laqueta Due  02/04/2019, 10:37 AM

## 2019-02-08 ENCOUNTER — Ambulatory Visit (INDEPENDENT_AMBULATORY_CARE_PROVIDER_SITE_OTHER): Payer: Medicare Other | Admitting: Neurology

## 2019-02-08 ENCOUNTER — Encounter: Payer: Self-pay | Admitting: Neurology

## 2019-02-08 ENCOUNTER — Other Ambulatory Visit: Payer: Self-pay

## 2019-02-08 VITALS — BP 130/70 | HR 64 | Temp 95.9°F | Wt 171.8 lb

## 2019-02-08 DIAGNOSIS — R413 Other amnesia: Secondary | ICD-10-CM

## 2019-02-08 DIAGNOSIS — I6523 Occlusion and stenosis of bilateral carotid arteries: Secondary | ICD-10-CM | POA: Diagnosis not present

## 2019-02-08 NOTE — Patient Instructions (Signed)
I had a long discussion with the patient and his son regarding his remote TIAs as well as mild cognitive impairment both of which appear to be stable at the present time. I do not believe any further diagnostic imaging on medication changes indicated at the present time. He will continue eliquis for stroke prevention given his atrial fibrillation and maintain strict control of diabetes with hemoglobin A1c goal below 6.5, lipids with LDL cholesterol goal below 70 mg percent and hypertension with blood pressure goal below 130/90. He was encouraged to eat a healthy diet, be active and also to correspond in mentally challenging activities like solving crossword puzzles, playing sudoku and bridge.  The patient has a strong family history of Alzheimer's and is at high risk and may consider possible participation in the [place] early dementia trial if interested.  He will return for follow-up in 6 months with my nurse practitioner [name] or call earlier if necessary

## 2019-02-08 NOTE — Progress Notes (Signed)
[facility] [address] [place]. [place] [address] [phone]       OFFICE FOLLOW UP VISIT NOTE  [name] Date of Birth:  Dec 22, 1935 Medical Record Number:  [id number]   Referring MD:  Reinaldo Meeker  Reason for Referral:  Vision and memory loss  HPI: 83 year male who had a transient episode of vision loss in the right eye on 01/28/14. He noticed cloudiness which she describes as a horizontal line in his right eye and he could not see below the line the bottom half but he was able to see above it. This lasted only a few minutes and recovered. He denied any accompanying of following headache for seeing zigzag patterns or lines. He is dizzy had a similar episode in November 2014 when he had bilateral vision loss involving the lower half of the field which also lasted only a minute or so. He did not seek any medical help her evaluation in that time period. She denies any chronic headaches, muscle aches, pain, scalp tenderness or jaw claudication. He was seen by me in October 2013 when he had a right parietal insular cortex infarct and was diagnosed with new-onset atrial fibrillation. He is done well from that stroke and recovered completely. He has been on Xarelto of and has been tolerated well without bleeding or bruising. He is in a complain of memory loss particularly not remembering dates and names as well as his hands. He gets frustrated from this and got treated recently. He however remains independent of activities of daily living. His wife handles the finances but the patient can drive and manage his own affairs. He states his blood pressure control has been quite good and his fasting sugars usually run below 120. Update 06/07/2014 : He returns for follow-up after last visit 6 months ago. He continues to do well and has not had a recurrent stroke or TIA symptoms. He saw ophthalmologist Dr. Samara Snide who felt he must have had cholesterol emboli. The patient did  undergo MRI scan of the brain on 02/14/14 which showed no acute abnormality with changes of chronic microvascular ischemia and generalized cerebral atrophy. MRA of the brain and neck did not reveal significant extra or intracranial vascular stenosis. Lab work  On 02/02/14 showed normal vitamin B12, TSH and RPR. Today on the Mini-Mental Status exam score 28/30. He had a colonoscopy yesterday and had held his xarelto for 5 days. He had 3 polyps removed and plans on restarting Xarelto he has not been doing mentally challenging exercises. Update 12/06/2014 : He returns for follow-up after last visit 6 months ago. He is accompanied by his wife feels that his memory may be somewhat better. He is taking Cerefolin but finds it quite expensive. He continues to have trouble remembering mainly recent information the memory from the past seems quite good. He is independent in activities of daily living. There have been no changes in behavior, agitation, hallucinations or nightmares or delusions noted. He continues to be bothered by left knee pain and wears a knee brace. He has not had any recurrent stroke or TIA symptoms and remains on Xarelto which is tolerating well without significant bleeding or bruising. His blood pressure seems well controlled. Lipid profile has not been checked recently. Update 06/20/2015:  He returns for follow-up after last visit 6 months ago. He is accompanied by son. He states is doing well and his memory is quite stable. Acute symptoms are quite active. He does mentally challenging  activities like crossword puzzles. Is physically also quite active. He unfortunately is having severe left knee pain and requires knee replacement which is scheduled for a couple of weeks. He remains on Xarelto which is tolerating well without side effects. He had taken Cerefolin which I had prescribed at last visit but when he ran out of the prescription his did not call for refill. Update 12/20/2015 : He returns for  follow-up after last visit 6 months ago. Is accompanied by his wife. He remained stable from neurovascular standpoint without recurrent TIA or stroke symptoms. He did undergo knee replacement successfully and is back on Xarelto which is tolerating well with only minor bruising but no bleeding. He unfortunately lost his 35-year-old daughter as well as his diet recently. He continues to have short-term memory difficulties and very occasionally word finding as well. He had run out of Cerefolin nac noticed that his cognitive difficulties got worse but now he  is back on it`s generic version and feels better. He did not start Resevetrol as had recommended last time. He does keep himself mentally challenged by taking on building or painting projects.. He states his blood pressure is well controlled and today it is 109/63. He has no new complaints today. Update 12/19/2016 ; he returns for follow-up after last visit a year ago. He is accompanied by his wife. Continues to have mild short-term memory difficulties which appear to be unchanged. He had partial knee replacement done by Dr. Lorre Nick which has gone well. He is concerned about his kidneys but recent lab work from 11/29/16 shows creatinine 1.16 but potassium is elevated at 5.7 for which a follow-up lab work is scheduled for next week. LDLcholesterol was 53 mg percent , triglycerides 104 and total cholesterol 119 mg percent. Patient continues to take care of meta-folbate plus which is generic form of Cerefolin nac  as it is cheaper. He does participate in cognitively challenging activities. He was on Xarelto which was switched to eliquis due to concerns about renal insufficiency 6 months ago. He is tolerating it well without bruising or bleeding. Has not had any recurrent TIA or stroke symptoms since 2013. He has no new complaints today. His blood pressure is well controlled today at 124/70. He does have a very strong family history of Alzheimer's and is worried about  developing it himself Update 01/26/2018 : she returns for follow-up after last visit a year ago. Is accompanied by his son. He continues to do well from neurovascular standpoint without recurrent TIA or stroke symptoms. His memory difficulties also appear to be unchanged. He has had no recent changes. He remains on eliquis which is tolerating well without bleeding and only minor bruising. Is tolerating Lipitor well without any side effects. His sugar and blood pressure both symptoms are well controlled. He has no new complaints today. On Mini-Mental status exam testing this could 29/30 which is stable from last visit. Update 01/26/2018 : he returns for follow-up after last visit a year ago. He is accompanied by his son. He continues to do well and has not had recurrent TIA or stroke symptoms. His short-term memory difficulties also continued to be unchanged. He has trouble remembering recent information. He however remains quite independent in activities of daily living. On the Mini-Mental status exam score today scored 29/30 with only one deficit in attention. He was able to name 20 animals with food legs. He was able to copy intersecting pentagons. He has no new complaints today. Update 02/08/2019 : He returns  for follow-up after last visit a year ago.  He is accompanied by his son.  Patient continues to have short-term memory and mild cognitive difficulties but these appear to be unchanged.  He is living at home with his wife.  He is independent with activities of daily living.  He is not driving.  He does do daily crossword puzzles.  He remains on Cerefolin NAC which is taking daily.  Patient's sister as well as 1 aunt had Alzheimer's.  He had follow-up carotid ultrasound done on 02/04/2019 at [facility] office which showed no significant bilateral carotid stenosis.  He has no new complaints. ROS:   14 system review of systems is positive for hearing loss,  , memory loss ,  and all other  systems negative  PMH:  Past Medical History:  Diagnosis Date  . AAA (abdominal aortic aneurysm) ([place])   . Arthritis   . Atrial fibrillation ([place])   . Coronary artery disease    post CABG x5 --Severe three-vessel coronary disease  . Diabetes mellitus Age 83  . Dysrhythmia   . Hyperlipidemia   . Hypertension   . Irregular heart beat  Feb 18, 2012  . Renal artery stenosis ([place])   . Stroke [facility]) Oct. 1, 2013   Mini stroke- Afib    Social History:  Social History   Socioeconomic History  . Marital status: Married    Spouse name: Not on file  . Number of children: Not on file  . Years of education: Not on file  . Highest education level: Not on file  Occupational History  . Not on file  Social Needs  . Financial resource strain: Not on file  . Food insecurity    Worry: Not on file    Inability: Not on file  . Transportation needs    Medical: Not on file    Non-medical: Not on file  Tobacco Use  . Smoking status: Former Smoker    Packs/day: 2.00    Years: 44.00    Pack years: 88.00    Types: Cigarettes    Quit date: 05/20/1988    Years since quitting: 30.7  . Smokeless tobacco: Former Systems developer    Quit date: 02/11/2003  Substance and Sexual Activity  . Alcohol use: No    Alcohol/week: 0.0 standard drinks  . Drug use: No  . Sexual activity: Never  Lifestyle  . Physical activity    Days per week: Not on file    Minutes per session: Not on file  . Stress: Not on file  Relationships  . Social Herbalist on phone: Not on file    Gets together: Not on file    Attends religious service: Not on file    Active member of club or organization: Not on file    Attends meetings of clubs or organizations: Not on file    Relationship status: Not on file  . Intimate partner violence    Fear of current or ex partner: Not on file    Emotionally abused: Not on file    Physically abused: Not on file    Forced sexual activity: Not on file  Other Topics Concern  . Not on  file  Social History Narrative  . Not on file    Medications:   Current Outpatient Medications on File Prior to Visit  Medication Sig Dispense Refill  . atorvastatin (LIPITOR) 80 MG tablet TAKE 1 TABLET DAILY 90 tablet 3  . diltiazem (  CARDIZEM CD) 120 MG 24 hr capsule TAKE 1 CAPSULE DAILY 90 capsule 3  . ELIQUIS 5 MG TABS tablet TAKE 1 TABLET TWICE A DAY 180 tablet 2  . metFORMIN (GLUCOPHAGE) 500 MG tablet Take 500 mg by mouth 2 (two) times daily with a meal.      . Methylfol-Methylcob-Acetylcyst (L-METHYL-MC NAC) 6-2-600 MG TABS TAKE 1 TABLET EVERY MORNING. 90 tablet 3  . metoprolol tartrate (LOPRESSOR) 25 MG tablet Take 25 mg by mouth daily.    . tamsulosin (FLOMAX) 0.4 MG CAPS capsule Take 0.4 mg by mouth daily.      No current facility-administered medications on file prior to visit.     Allergies:  No Known Allergies  Physical Exam General: well developed, well nourished elderly male, seated, in no evident distress Head: head normocephalic and atraumatic.  Neck: supple with no carotid or supraclavicular bruits Cardiovascular: regular rate and rhythm, no murmurs Musculoskeletal: no deformity Skin:  no rash/petichiae Vascular:  Normal pulses all extremities Vitals:   02/08/19 1302  BP: 130/70  Pulse: 64  Temp: (!) 95.9 F (35.5 C)    Neurologic Exam Mental Status: Awake and fully alert. Oriented to place and time. Recent and remote memory diminished. Attention span, concentration and fund of knowledge appropriate. Mood and affect appropriate. Mini-Mental status exam not done today.  Recall 2/3. AFT 11.. Clock drawing 4/4.   Cranial Nerves: Fundoscopic exam not done . Pupils equal, briskly reactive to light. Extraocular movements full without nystagmus. Visual fields full to confrontation. Hearing diminished bilaterally. Facial sensation intact. Face, tongue, palate moves normally and symmetrically.  Motor: Normal bulk and tone. Normal strength in all tested extremity  muscles. Sensory.: intact to touch and pinprick and vibratory sensation.  Coordination: Rapid alternating movements normal in all extremities. Finger-to-nose and heel-to-shin performed accurately bilaterally. Gait and Station: Arises from chair without difficulty. Stance is normal. Gait demonstrates normal stride length and balance . Unable to heel, toe and tandem walk without difficulty.  Reflexes: 1+ and symmetric. Toes downgoing.     ASSESSMENT: 83 year male with transient episodes of visual loss of unclear etiology possibly small vessel retinal TIAs. He complains of memory and cognitive difficulties likely from mild cognitive impairment but strong family history of Alzheimer's and is concerning. Remote history of embolic right MCA branch infarct in October 2013 from atrial fibrillation with good neurological recovery    PLAN: I had a long discussion with the patient and his son regarding his remote TIAs as well as mild cognitive impairment both of which appear to be stable at the present time. I do not believe any further diagnostic imaging on medication changes indicated at the present time. He will continue eliquis for stroke prevention given his atrial fibrillation and maintain strict control of diabetes with hemoglobin A1c goal below 6.5, lipids with LDL cholesterol goal below 70 mg percent and hypertension with blood pressure goal below 130/90. He was encouraged to eat a healthy diet, be active and also to correspond in mentally challenging activities like solving crossword puzzles, playing sudoku and bridge.  The patient has a strong family history of Alzheimer's and is at high risk and may consider possible participation in the [place] early dementia trial if interested.  He will return for follow-up in 6 months with my nurse practitioner [name] or call earlier if necessary.Greater than 50% time during this 25 minute visit was spent on counseling and coordination of care upon mild  cognitive impairment and stroke risk He will return  for follow-up in one year or call earlier if necessary  Antony Contras, MD Note: This document was prepared with digital dictation and possible smart phrase technology. Any transcriptional errors that result from this process are unintentional.

## 2019-02-15 ENCOUNTER — Other Ambulatory Visit: Payer: Self-pay | Admitting: Neurology

## 2019-03-30 DIAGNOSIS — Z23 Encounter for immunization: Secondary | ICD-10-CM | POA: Diagnosis not present

## 2019-03-30 DIAGNOSIS — Z1159 Encounter for screening for other viral diseases: Secondary | ICD-10-CM | POA: Diagnosis not present

## 2019-03-30 DIAGNOSIS — R42 Dizziness and giddiness: Secondary | ICD-10-CM | POA: Diagnosis not present

## 2019-04-23 ENCOUNTER — Telehealth: Payer: Self-pay | Admitting: Neurology

## 2019-04-23 NOTE — Telephone Encounter (Addendum)
Pt son Gary(on [place]) has called to ask for appointment re: concerns about dizziness for weeks and recently being in car with wife and in words of patient everything suddenly going to the left.  [name] states pt was leaving to the left.  Son can be reached at [phone] if RN needs to call.

## 2019-04-26 ENCOUNTER — Encounter: Payer: Self-pay | Admitting: Adult Health

## 2019-04-26 ENCOUNTER — Ambulatory Visit (INDEPENDENT_AMBULATORY_CARE_PROVIDER_SITE_OTHER): Payer: Medicare Other | Admitting: Adult Health

## 2019-04-26 ENCOUNTER — Other Ambulatory Visit: Payer: Self-pay

## 2019-04-26 VITALS — HR 64 | Temp 97.1°F | Ht 67.0 in | Wt 175.0 lb

## 2019-04-26 DIAGNOSIS — R42 Dizziness and giddiness: Secondary | ICD-10-CM

## 2019-04-26 DIAGNOSIS — Z8673 Personal history of transient ischemic attack (TIA), and cerebral infarction without residual deficits: Secondary | ICD-10-CM | POA: Diagnosis not present

## 2019-04-26 DIAGNOSIS — R29818 Other symptoms and signs involving the nervous system: Secondary | ICD-10-CM | POA: Diagnosis not present

## 2019-04-26 DIAGNOSIS — H81399 Other peripheral vertigo, unspecified ear: Secondary | ICD-10-CM

## 2019-04-26 DIAGNOSIS — I4821 Permanent atrial fibrillation: Secondary | ICD-10-CM

## 2019-04-26 NOTE — Patient Instructions (Addendum)
Your Plan:  Your vertigo/dizziness appears to be more likely due to a benign positional vertigo but would recommend obtaining CT head to ensure there has not been a new stroke.  Treatment for benign positional vertigo (BPPV) is typically vestibular rehab therefore referral will be placed.  They will call you to schedule initial evaluation  There is a high chance that you have sleep apnea due to your medical history and symptoms.  Referral will be placed to [facility] for initial evaluation to pursue sleep study  Follow-up with ENT at the end of this month to rule out any inner ear issues causing dizziness/vertigo as well as treatment for impacted earwax in your right ear.  Avoid use of Q-tips fingers or objects to clean inner ear  You may also be experiencing mild orthostatic hypotension which is a decrease in your blood pressure when laying to sitting or sitting to standing.  Recommend increasing fluid intake and ensuring you are stable prior to ambulating.  Also try use of compression stockings which can help encourage fluid to rise up on his legs after sitting   Follow-up in 3 months or call earlier if needed     Thank you for coming to see [place] at [facility]. I hope we have been able to provide you high quality care today.  You may receive a patient satisfaction survey over the next few weeks. We would appreciate your feedback and comments so that we may continue to improve ourselves and the health of our patients.   Sleep Apnea Sleep apnea affects breathing during sleep. It causes breathing to stop for a short time or to become shallow. It can also increase the risk of:  Heart attack.  Stroke.  Being very overweight (obese).  Diabetes.  Heart failure.  Irregular heartbeat. The goal of treatment is to help you breathe normally again. What are the causes? There are three kinds of sleep apnea:  Obstructive sleep apnea. This is caused by a blocked or  collapsed airway.  Central sleep apnea. This happens when the brain does not send the right signals to the muscles that control breathing.  Mixed sleep apnea. This is a combination of obstructive and central sleep apnea. The most common cause of this condition is a collapsed or blocked airway. This can happen if:  Your throat muscles are too relaxed.  Your tongue and tonsils are too large.  You are overweight.  Your airway is too small. What increases the risk?  Being overweight.  Smoking.  Having a small airway.  Being older.  Being male.  Drinking alcohol.  Taking medicines to calm yourself (sedatives or tranquilizers).  Having family members with the condition. What are the signs or symptoms?  Trouble staying asleep.  Being sleepy or tired during the day.  Getting angry a lot.  Loud snoring.  Headaches in the morning.  Not being able to focus your mind (concentrate).  Forgetting things.  Less interest in sex.  Mood swings.  Personality changes.  Feelings of sadness (depression).  Waking up a lot during the night to pee (urinate).  Dry mouth.  Sore throat. How is this diagnosed?  Your medical history.  A physical exam.  A test that is done when you are sleeping (sleep study). The test is most often done in a sleep lab but may also be done at home. How is this treated?   Sleeping on your side.  Using a medicine to get rid of mucus in  your nose (decongestant).  Avoiding the use of alcohol, medicines to help you relax, or certain pain medicines (narcotics).  Losing weight, if needed.  Changing your diet.  Not smoking.  Using a machine to open your airway while you sleep, such as: ? An oral appliance. This is a mouthpiece that shifts your lower jaw forward. ? A CPAP device. This device blows air through a mask when you breathe out (exhale). ? An EPAP device. This has valves that you put in each nostril. ? A BPAP device. This device  blows air through a mask when you breathe in (inhale) and breathe out.  Having surgery if other treatments do not work. It is important to get treatment for sleep apnea. Without treatment, it can lead to:  High blood pressure.  Coronary artery disease.  In men, not being able to have an erection (impotence).  Reduced thinking ability. Follow these instructions at home: Lifestyle  Make changes that your doctor recommends.  Eat a healthy diet.  Lose weight if needed.  Avoid alcohol, medicines to help you relax, and some pain medicines.  Do not use any products that contain nicotine or tobacco, such as cigarettes, e-cigarettes, and chewing tobacco. If you need help quitting, ask your doctor. General instructions  Take over-the-counter and prescription medicines only as told by your doctor.  If you were given a machine to use while you sleep, use it only as told by your doctor.  If you are having surgery, make sure to tell your doctor you have sleep apnea. You may need to bring your device with you.  Keep all follow-up visits as told by your doctor. This is important. Contact a doctor if:  The machine that you were given to use during sleep bothers you or does not seem to be working.  You do not get better.  You get worse. Get help right away if:  Your chest hurts.  You have trouble breathing in enough air.  You have an uncomfortable feeling in your back, arms, or stomach.  You have trouble talking.  One side of your body feels weak.  A part of your face is hanging down. These symptoms may be an emergency. Do not wait to see if the symptoms will go away. Get medical help right away. Call your local emergency services (911 in the [place] Do not drive yourself to the hospital. Summary  This condition affects breathing during sleep.  The most common cause is a collapsed or blocked airway.  The goal of treatment is to help you breathe normally while you sleep. This  information is not intended to replace advice given to you by your health care provider. Make sure you discuss any questions you have with your health care provider. Document Released: 02/13/2008 Document Revised: 02/20/2018 Document Reviewed: 12/30/2017 Elsevier Patient Education  [employer].   Vertigo Vertigo is the feeling that you or the things around you are moving when they are not. This feeling can come and go at any time. Vertigo often goes away on its own. This condition can be dangerous if it happens when you are doing activities like driving or working with machines. Your doctor will do tests to find the cause of your vertigo. These tests will also help your doctor decide on the best treatment for you. Follow these instructions at home: Eating and drinking      Drink enough fluid to keep your pee (urine) pale yellow.  Do not drink alcohol. Activity  Return to your normal activities as told by your doctor. Ask your doctor what activities are safe for you.  In the morning, first sit up on the side of the bed. When you feel okay, stand slowly while you hold onto something until you know that your balance is fine.  Move slowly. Avoid sudden body or head movements or certain positions, as told by your doctor.  Use a cane if you have trouble standing or walking.  Sit down right away if you feel dizzy.  Avoid doing any tasks or activities that can cause danger to you or others if you get dizzy.  Avoid bending down if you feel dizzy. Place items in your home so that they are easy for you to reach without leaning over.  Do not drive or use heavy machinery if you feel dizzy. General instructions  Take over-the-counter and prescription medicines only as told by your doctor.  Keep all follow-up visits as told by your doctor. This is important. Contact a doctor if:  Your medicine does not help your vertigo.  You have a fever.  Your problems get worse or you have new  symptoms.  Your family or friends see changes in your behavior.  The feeling of being sick to your stomach gets worse.  Your vomiting gets worse.  You lose feeling (have numbness) in part of your body.  You feel prickling and tingling in a part of your body. Get help right away if:  You have trouble moving or talking.  You are always dizzy.  You pass out (faint).  You get very bad headaches.  You feel weak in your hands, arms, or legs.  You have changes in your hearing.  You have changes in how you see (vision).  You get a stiff neck.  Bright light starts to bother you. Summary  Vertigo is the feeling that you or the things around you are moving when they are not.  Your doctor will do tests to find the cause of your vertigo.  You may be told to avoid some tasks, positions, or movements.  Contact a doctor if your medicine is not helping, or if you have a fever, new symptoms, or a change in behavior.  Get help right away if you get very bad headaches, or if you have changes in how you speak, hear, or see. This information is not intended to replace advice given to you by your health care provider. Make sure you discuss any questions you have with your health care provider. Document Released: 02/13/2008 Document Revised: 03/30/2018 Document Reviewed: 03/30/2018 Elsevier Patient Education  2020 [employer].   Orthostatic Hypotension Blood pressure is a measurement of how strongly, or weakly, your blood is pressing against the walls of your arteries. Orthostatic hypotension is a sudden drop in blood pressure that happens when you quickly change positions, such as when you get up from sitting or lying down. Arteries are blood vessels that carry blood from your heart throughout your body. When blood pressure is too low, you may not get enough blood to your brain or to the rest of your organs. This can cause weakness, light-headedness, rapid heartbeat, and fainting. This can  last for just a few seconds or for up to a few minutes. Orthostatic hypotension is usually not a serious problem. However, if it happens frequently or gets worse, it may be a sign of something more serious. What are the causes? This condition may be caused by:  Sudden changes in  posture, such as standing up quickly after you have been sitting or lying down.  Blood loss.  Loss of body fluids (dehydration).  Heart problems.  Hormone (endocrine) problems.  Pregnancy.  Severe infection.  Lack of certain nutrients.  Severe allergic reactions (anaphylaxis).  Certain medicines, such as blood pressure medicine or medicines that make the body lose excess fluids (diuretics). Sometimes, this condition can be caused by not taking medicine as directed, such as taking too much of a certain medicine. What increases the risk? The following factors may make you more likely to develop this condition:  Age. Risk increases as you get older.  Conditions that affect the heart or the central nervous system.  Taking certain medicines, such as blood pressure medicine or diuretics.  Being pregnant. What are the signs or symptoms? Symptoms of this condition may include:  Weakness.  Light-headedness.  Dizziness.  Blurred vision.  Fatigue.  Rapid heartbeat.  Fainting, in severe cases. How is this diagnosed? This condition is diagnosed based on:  Your medical history.  Your symptoms.  Your blood pressure measurement. Your health care provider will check your blood pressure when you are: ? Lying down. ? Sitting. ? Standing. A blood pressure reading is recorded as two numbers, such as "120 over 80" (or 120/80). The first ("top") number is called the systolic pressure. It is a measure of the pressure in your arteries as your heart beats. The second ("bottom") number is called the diastolic pressure. It is a measure of the pressure in your arteries when your heart relaxes between beats.  Blood pressure is measured in a unit called mm Hg. Healthy blood pressure for most adults is 120/80. If your blood pressure is below 90/60, you may be diagnosed with hypotension. Other information or tests that may be used to diagnose orthostatic hypotension include:  Your other vital signs, such as your heart rate and temperature.  Blood tests.  Tilt table test. For this test, you will be safely secured to a table that moves you from a lying position to an upright position. Your heart rhythm and blood pressure will be monitored during the test. How is this treated? This condition may be treated by:  Changing your diet. This may involve eating more salt (sodium) or drinking more water.  Taking medicines to raise your blood pressure.  Changing the dosage of certain medicines you are taking that might be lowering your blood pressure.  Wearing compression stockings. These stockings help to prevent blood clots and reduce swelling in your legs. In some cases, you may need to go to the hospital for:  Fluid replacement. This means you will receive fluids through an IV.  Blood replacement. This means you will receive donated blood through an IV (transfusion).  Treating an infection or heart problems, if this applies.  Monitoring. You may need to be monitored while medicines that you are taking wear off. Follow these instructions at home: Eating and drinking   Drink enough fluid to keep your urine pale yellow.  Eat a healthy diet, and follow instructions from your health care provider about eating or drinking restrictions. A healthy diet includes: ? Fresh fruits and vegetables. ? Whole grains. ? Lean meats. ? Low-fat dairy products.  Eat extra salt only as directed. Do not add extra salt to your diet unless your health care provider told you to do that.  Eat frequent, small meals.  Avoid standing up suddenly after eating. Medicines  Take over-the-counter and prescription  medicines only  as told by your health care provider. ? Follow instructions from your health care provider about changing the dosage of your current medicines, if this applies. ? Do not stop or adjust any of your medicines on your own. General instructions   Wear compression stockings as told by your health care provider.  Get up slowly from lying down or sitting positions. This gives your blood pressure a chance to adjust.  Avoid hot showers and excessive heat as directed by your health care provider.  Return to your normal activities as told by your health care provider. Ask your health care provider what activities are safe for you.  Do not use any products that contain nicotine or tobacco, such as cigarettes, e-cigarettes, and chewing tobacco. If you need help quitting, ask your health care provider.  Keep all follow-up visits as told by your health care provider. This is important. Contact a health care provider if you:  Vomit.  Have diarrhea.  Have a fever for more than 2-3 days.  Feel more thirsty than usual.  Feel weak and tired. Get help right away if you:  Have chest pain.  Have a fast or irregular heartbeat.  Develop numbness in any part of your body.  Cannot move your arms or your legs.  Have trouble speaking.  Become sweaty or feel light-headed.  Faint.  Feel short of breath.  Have trouble staying awake.  Feel confused. Summary  Orthostatic hypotension is a sudden drop in blood pressure that happens when you quickly change positions.  Orthostatic hypotension is usually not a serious problem.  It is diagnosed by having your blood pressure taken lying down, sitting, and then standing.  It may be treated by changing your diet or adjusting your medicines. This information is not intended to replace advice given to you by your health care provider. Make sure you discuss any questions you have with your health care provider. Document Released:  04/26/2002 Document Revised: 10/30/2017 Document Reviewed: 10/30/2017 Elsevier Patient Education  2020 [employer].

## 2019-04-26 NOTE — Telephone Encounter (Signed)
I called pts son about his father being dizzy. He stated pt had been dizzy for a couple of weeks. He saw PCP in the past and was given antivert and it seem to help. Pt was dizzy and than vomit. I ask pts on did they call his pcp. He call our office because pt had a stroke in the past and he was concern. I advise pt son that if his father is having new symptoms that are worrisome to always seek the nearest ED for work up. The pt has an appt at 245pm with [name] NP.

## 2019-04-26 NOTE — Progress Notes (Signed)
[facility] [address] [place]. [place] [address] [phone]       OFFICE FOLLOW UP VISIT NOTE  Gabriel Gutierrez Date of Birth:  [birth year] Medical Record Number:  [id number]   Referring MD:  Reinaldo Meeker  Reason for Referral:  Vision and memory loss  HPI: 83 year male who had a transient episode of vision loss in the right eye on 01/28/14. He noticed cloudiness which she describes as a horizontal line in his right eye and he could not see below the line the bottom half but he was able to see above it. This lasted only a few minutes and recovered. He denied any accompanying of following headache for seeing zigzag patterns or lines. He is dizzy had a similar episode in November 2014 when he had bilateral vision loss involving the lower half of the field which also lasted only a minute or so. He did not seek any medical help her evaluation in that time period. She denies any chronic headaches, muscle aches, pain, scalp tenderness or jaw claudication. He was seen by me in October 2013 when he had a right parietal insular cortex infarct and was diagnosed with new-onset atrial fibrillation. He is done well from that stroke and recovered completely. He has been on Xarelto of and has been tolerated well without bleeding or bruising. He is in a complain of memory loss particularly not remembering dates and names as well as his hands. He gets frustrated from this and got treated recently. He however remains independent of activities of daily living. His wife handles the finances but the patient can drive and manage his own affairs. He states his blood pressure control has been quite good and his fasting sugars usually run below 120. Update 06/07/2014 : He returns for follow-up after last visit 6 months ago. He continues to do well and has not had a recurrent stroke or TIA symptoms. He saw ophthalmologist Dr. Samara Snide who felt he must have had cholesterol emboli. The patient did  undergo MRI scan of the brain on 02/14/14 which showed no acute abnormality with changes of chronic microvascular ischemia and generalized cerebral atrophy. MRA of the brain and neck did not reveal significant extra or intracranial vascular stenosis. Lab work  On 02/02/14 showed normal vitamin B12, TSH and RPR. Today on the Mini-Mental Status exam score 28/30. He had a colonoscopy yesterday and had held his xarelto for 5 days. He had 3 polyps removed and plans on restarting Xarelto he has not been doing mentally challenging exercises. Update 12/06/2014 : He returns for follow-up after last visit 6 months ago. He is accompanied by his wife feels that his memory may be somewhat better. He is taking Cerefolin but finds it quite expensive. He continues to have trouble remembering mainly recent information the memory from the past seems quite good. He is independent in activities of daily living. There have been no changes in behavior, agitation, hallucinations or nightmares or delusions noted. He continues to be bothered by left knee pain and wears a knee brace. He has not had any recurrent stroke or TIA symptoms and remains on Xarelto which is tolerating well without significant bleeding or bruising. His blood pressure seems well controlled. Lipid profile has not been checked recently. Update 06/20/2015:  He returns for follow-up after last visit 6 months ago. He is accompanied by son. He states is doing well and his memory is quite stable. Acute symptoms are quite active. He does mentally challenging  activities like crossword puzzles. Is physically also quite active. He unfortunately is having severe left knee pain and requires knee replacement which is scheduled for a couple of weeks. He remains on Xarelto which is tolerating well without side effects. He had taken Cerefolin which I had prescribed at last visit but when he ran out of the prescription his did not call for refill. Update 12/20/2015 : He returns for  follow-up after last visit 6 months ago. Is accompanied by his wife. He remained stable from neurovascular standpoint without recurrent TIA or stroke symptoms. He did undergo knee replacement successfully and is back on Xarelto which is tolerating well with only minor bruising but no bleeding. He unfortunately lost his 71-year-old daughter as well as his diet recently. He continues to have short-term memory difficulties and very occasionally word finding as well. He had run out of Cerefolin nac noticed that his cognitive difficulties got worse but now he  is back on it`s generic version and feels better. He did not start Resevetrol as had recommended last time. He does keep himself mentally challenged by taking on building or painting projects.. He states his blood pressure is well controlled and today it is 109/63. He has no new complaints today. Update 12/19/2016 ; he returns for follow-up after last visit a year ago. He is accompanied by his wife. Continues to have mild short-term memory difficulties which appear to be unchanged. He had partial knee replacement done by Dr. Lorre Nick which has gone well. He is concerned about his kidneys but recent lab work from 11/29/16 shows creatinine 1.16 but potassium is elevated at 5.7 for which a follow-up lab work is scheduled for next week. LDLcholesterol was 53 mg percent , triglycerides 104 and total cholesterol 119 mg percent. Patient continues to take care of meta-folbate plus which is generic form of Cerefolin nac  as it is cheaper. He does participate in cognitively challenging activities. He was on Xarelto which was switched to eliquis due to concerns about renal insufficiency 6 months ago. He is tolerating it well without bruising or bleeding. Has not had any recurrent TIA or stroke symptoms since 2013. He has no new complaints today. His blood pressure is well controlled today at 124/70. He does have a very strong family history of Alzheimer's and is worried about  developing it himself Update 01/26/2018 : she returns for follow-up after last visit a year ago. Is accompanied by his son. He continues to do well from neurovascular standpoint without recurrent TIA or stroke symptoms. His memory difficulties also appear to be unchanged. He has had no recent changes. He remains on eliquis which is tolerating well without bleeding and only minor bruising. Is tolerating Lipitor well without any side effects. His sugar and blood pressure both symptoms are well controlled. He has no new complaints today. On Mini-Mental status exam testing this could 29/30 which is stable from last visit. Update 01/26/2018 : he returns for follow-up after last visit a year ago. He is accompanied by his son. He continues to do well and has not had recurrent TIA or stroke symptoms. His short-term memory difficulties also continued to be unchanged. He has trouble remembering recent information. He however remains quite independent in activities of daily living. On the Mini-Mental status exam score today scored 29/30 with only one deficit in attention. He was able to name 20 animals with food legs. He was able to copy intersecting pentagons. He has no new complaints today. Update 02/08/2019 : He returns  for follow-up after last visit a year ago.  He is accompanied by his son.  Patient continues to have short-term memory and mild cognitive difficulties but these appear to be unchanged.  He is living at home with his wife.  He is independent with activities of daily living.  He is not driving.  He does do daily crossword puzzles.  He remains on Cerefolin NAC which is taking daily.  Patient's sister as well as 1 aunt had Alzheimer's.  He had follow-up carotid ultrasound done on 02/04/2019 at [facility] which showed no significant bilateral carotid stenosis.  He has no new complaints.  Update 04/26/2019: Gabriel Gutierrez is a 83 year old male who is being seen today by patient/family request  due to 2-week onset of dizziness.  Recently seen in this office by Dr. Leonie Man on 02/08/2019 for follow-up regarding cognitive impairment.  He describes 1 episode recently while sitting in the car waiting for his wife at the dentist office where he felt as though the building in front of him was spinning/moving and then felt recently he was getting pulled to the left where he had to physically adjust himself upright.  This sensation lasted approximately 5 minutes and then "felt hot" followed by episode of nausea with vomiting.  After episode of emesis, all symptoms resolved.  He has not experienced any recurrent episodes.  He does report short episodes of vertigo/room spinning sensation with quick head changes.  He also endorses lightheadedness/dizziness sensation with quickly standing or changing positions and will shortly improve.  He was seen by his PCP who referred him to ENT and initiated meclizine.  He will take meclizine as needed with benefit.  He has initial evaluation with ENT at the end of this month.  Orthostatics initially obtained by [facility] which actually showed increased levels when going from supine, sitting and then standing.  Repeated blood pressures from sitting to standing with sitting 142/62 and standing 122/58.  He does report hearing loss bilaterally and has been using hearing aids for the past year.  He denies recent change of medications or diet.  Blood pressure not monitored during episodes.  He denies prior symptoms of vertigo or dizziness.  He denies vision changes, speech changes, weakness or numbness/tingling.  He has remained on Eliquis 5 mg twice daily and atorvastatin 80 mg daily for secondary stroke prevention.  Son also questions possible risk for sleep apnea as patient does have daytime fatigue with frequent napping and snoring.  He has not previously underwent sleep study.  His son has been diagnosed with sleep apnea with use of CPAP machine for treatment     ROS:   14 system  review of systems is positive for vertigo and all other systems negative  PMH:  Past Medical History:  Diagnosis Date   AAA (abdominal aortic aneurysm) (HCC)    Arthritis    Atrial fibrillation ([place])    Coronary artery disease    post CABG x5 --Severe three-vessel coronary disease   Diabetes mellitus Age 26   Dysrhythmia    Hyperlipidemia    Hypertension    Irregular heart beat  Feb 18, 2012   Renal artery stenosis [facility])    Stroke ([place]) Oct. 1, 2013   Mini stroke- Afib    Social History:  Social History   Socioeconomic History   Marital status: Married    Spouse name: Not on file   Number of children: Not on file   Years of education: Not on  file   Highest education level: Not on file  Occupational History   Not on file  Social Needs   Financial resource strain: Not on file   Food insecurity    Worry: Not on file    Inability: Not on file   Transportation needs    Medical: Not on file    Non-medical: Not on file  Tobacco Use   Smoking status: Former Smoker    Packs/day: 2.00    Years: 44.00    Pack years: 88.00    Types: Cigarettes    Quit date: 05/20/1988    Years since quitting: 30.9   Smokeless tobacco: Former Systems developer    Quit date: 02/11/2003  Substance and Sexual Activity   Alcohol use: No    Alcohol/week: 0.0 standard drinks   Drug use: No   Sexual activity: Never  Lifestyle   Physical activity    Days per week: Not on file    Minutes per session: Not on file   Stress: Not on file  Relationships   Social connections    Talks on phone: Not on file    Gets together: Not on file    Attends religious service: Not on file    Active member of club or organization: Not on file    Attends meetings of clubs or organizations: Not on file    Relationship status: Not on file   Intimate partner violence    Fear of current or ex partner: Not on file    Emotionally abused: Not on file    Physically abused: Not on file    Forced sexual  activity: Not on file  Other Topics Concern   Not on file  Social History Narrative   Not on file    Medications:   Current Outpatient Medications on File Prior to Visit  Medication Sig Dispense Refill   atorvastatin (LIPITOR) 80 MG tablet TAKE 1 TABLET DAILY 90 tablet 3   diltiazem (CARDIZEM CD) 120 MG 24 hr capsule TAKE 1 CAPSULE DAILY 90 capsule 3   ELIQUIS 5 MG TABS tablet TAKE 1 TABLET TWICE A DAY 180 tablet 2   meclizine (ANTIVERT) 25 MG tablet Take 25 mg by mouth 3 (three) times daily as needed for dizziness.     metFORMIN (GLUCOPHAGE) 500 MG tablet Take 500 mg by mouth 2 (two) times daily with a meal.       metoprolol tartrate (LOPRESSOR) 25 MG tablet Take 25 mg by mouth daily.     tamsulosin (FLOMAX) 0.4 MG CAPS capsule Take 0.4 mg by mouth daily.      Methylfol-Methylcob-Acetylcyst (L-METHYL-MC NAC) 6-2-600 MG TABS TAKE 1 TABLET BY MOUTH EVERY MORNING (Patient not taking: Reported on 04/26/2019) 90 tablet 3   No current facility-administered medications on file prior to visit.     Allergies:  No Known Allergies  Physical Exam  Today's Vitals   04/26/19 1435  Pulse: 64  Temp: (!) 97.1 F (36.2 C)  TempSrc: Oral  Weight: 175 lb (79.4 kg)  Height: 5\' 7"  (1.702 m)   Orthostatic VS for the past 72 hrs (Last 3 readings):  Orthostatic BP Patient Position BP Location Cuff Size Orthostatic Pulse  04/26/19 1438 142/62 Sitting Right Arm Normal 56  04/26/19 1436 122/58 Standing Right Arm Normal 54    Body mass index is 27.41 kg/m.  General: well developed, well nourished pleasant elderly male, seated, in no evident distress Head: head normocephalic and atraumatic.  Ears: Evidence of impacted cerumen right  ear along with appearance of dried and fresh blood; left ear WNL Neck: supple with no carotid or supraclavicular bruits Cardiovascular: irregular rate and rhythm, no murmurs Musculoskeletal: no deformity Skin:  no rash/petichiae Vascular:  Normal pulses all  extremities  Neurologic Exam Mental Status: Awake and fully alert. Oriented to place and time. Recent and remote memory diminished. Attention span, concentration and fund of knowledge appropriate during visit. Mood and affect appropriate.  Cranial Nerves: Pupils equal, briskly reactive to light. Extraocular movements full without nystagmus. Visual fields full to confrontation. Hearing diminished bilaterally. Facial sensation intact. Face, tongue, palate moves normally and symmetrically.  Motor: Normal bulk and tone. Normal strength in all tested extremity muscles. Sensory.: intact to touch and pinprick and vibratory sensation.  Coordination: Rapid alternating movements normal in all extremities. Finger-to-nose and heel-to-shin performed accurately bilaterally. Gait and Station: Arises from chair without difficulty. Stance is normal. Gait demonstrates normal stride length and mild to moderate imbalance with mild swaying. Able to heel, toe and tandem walk with mild difficulty.  Romberg negative. Reflexes: 1+ and symmetric. Toes downgoing.     ASSESSMENT: 83 year male with prior history of right MCA stroke 02/2012, atrial fibrillation on Eliquis, possible TIAs and cognitive impairment for which he is routinely followed in this office.  He is being seen today per patient/family request due to recent onset of vertigo.  Son also questions possible risk of sleep apnea    PLAN: Vertigo symptoms appear to be related to BPPV but due to episode accompanied by vomiting and recent onset of symptoms, recommend obtaining CT head to rule out acute stroke or abnormality.  Referral placed to PT for vestibular rehab.  Advised to have evaluation by ENT as recommended by PCP as well as for treatment of impacted cerumen.  He can continue to use meclizine as prescribed by PCP if he feels as though good benefit Also appears to have mild orthostatic hypotension which likely causing dizziness/lightheadedness with quick  position changes.  Provided patient with additional information regarding treatment measures at home such as standing slowly, increasing fluid intake and use of compression stockings. Advised to monitor blood pressure at home routinely as well as with any additional vertigo symptoms Ongoing use of Eliquis for secondary stroke prevention and history of atrial fibrillation with follow-up with oncology as scheduled Referral placed to [facility] due to high risk of sleep apnea with underlying history of prior stroke and atrial fibrillation.  Discussed increased risk of cardiovascular disease and reoccurring strokes if not treated.  Advised to follow-up in 3 months to assess improvement of potential vertigo and lightheadedness symptoms as well as memory follow-up.   Greater than 50% time during this prolonged 45 minute visit was spent on discussing new onset vertigo and possible etiology with further work-up and potential treatment options, discussion regarding possible orthostatic hypotension and treatment options along with discussing symptoms and risks of possible sleep apnea and answered all questions to patient satisfaction.  Due to time constraints, cognitive impairment was not discussed  Frann Rider, AGNP-BC  [facility] [address] [place], [place] [address]  Phone [phone] Fax [phone] Note: This document was prepared with digital dictation and possible smart phrase technology. Any transcriptional errors that result from this process are unintentional.

## 2019-04-27 NOTE — Progress Notes (Signed)
I agree with the above plan 

## 2019-05-06 ENCOUNTER — Other Ambulatory Visit: Payer: Self-pay

## 2019-05-06 ENCOUNTER — Ambulatory Visit (INDEPENDENT_AMBULATORY_CARE_PROVIDER_SITE_OTHER): Payer: Medicare Other | Admitting: Neurology

## 2019-05-06 ENCOUNTER — Encounter: Payer: Self-pay | Admitting: Neurology

## 2019-05-06 ENCOUNTER — Ambulatory Visit
Admission: RE | Admit: 2019-05-06 | Discharge: 2019-05-06 | Disposition: A | Discharge status: Home / Self Care | Payer: Medicare Other | Source: Ambulatory Visit | Attending: Adult Health | Admitting: Adult Health

## 2019-05-06 VITALS — BP 123/69 | HR 63 | Ht 66.0 in | Wt 173.0 lb

## 2019-05-06 DIAGNOSIS — I6523 Occlusion and stenosis of bilateral carotid arteries: Secondary | ICD-10-CM | POA: Diagnosis not present

## 2019-05-06 DIAGNOSIS — G4719 Other hypersomnia: Secondary | ICD-10-CM

## 2019-05-06 DIAGNOSIS — Z8673 Personal history of transient ischemic attack (TIA), and cerebral infarction without residual deficits: Secondary | ICD-10-CM | POA: Diagnosis not present

## 2019-05-06 DIAGNOSIS — R42 Dizziness and giddiness: Secondary | ICD-10-CM | POA: Diagnosis not present

## 2019-05-06 DIAGNOSIS — I4821 Permanent atrial fibrillation: Secondary | ICD-10-CM | POA: Diagnosis not present

## 2019-05-06 DIAGNOSIS — R413 Other amnesia: Secondary | ICD-10-CM

## 2019-05-06 DIAGNOSIS — E663 Overweight: Secondary | ICD-10-CM

## 2019-05-06 DIAGNOSIS — R351 Nocturia: Secondary | ICD-10-CM

## 2019-05-06 NOTE — Patient Instructions (Signed)

## 2019-05-06 NOTE — Progress Notes (Signed)
Subjective:    Patient ID: Gabriel Gutierrez is a 83 y.o. male.  HPI      Star Age, MD, PhD [facility] [address] [place], [place] [address]  Dear [name],   I saw your patient, Gabriel Gutierrez, upon your kind request in my sleep clinic today for initial consultation of his sleep disorder, in particular, concern for underlying obstructive sleep apnea.  The patient is accompanied by his wife today.  As you know, Gabriel Gutierrez is an 83 year old right-handed gentleman with an underlying medical history of stroke, A. fib, AAA, arthritis, coronary artery disease with status post 5 vessel CABG, renal artery stenosis, hypertension, hyperlipidemia, diabetes, memory loss, hearing loss, dizziness, and mildly overweight state, who reports no problems falling asleep or staying asleep but is sleepy during the day.  His wife provides most of his history, in part, because he forgot his hearing aids.  She reports that he for the past 5 years he has had progressive daytime sleepiness.  He dozes off frequently during the day.  His Epworth sleepiness score is 16 out of 24 today, fatigue severity score is 54 out of 63.  She is not aware of any snoring when he is sleeping but he does seem to have labored breathing at times.  Of note, she sleeps with a CPAP machine. I reviewed your office note from 04/26/2019.  He lives with his wife, they have 3 grown sons, all close by.  He has nocturia about 2-3 times per average night and denies morning headaches.  Bedtime is generally between 830 and 9 and rise time is between 7 and 7:30 AM.  He does like to drink caffeine in the form of coffee, she estimates that he drinks about 10 cups of coffee per day.  He does not drink a whole lot of water, no alcohol, quit smoking many years ago.  His Past Medical History Is Significant For: Past Medical History:  Diagnosis Date  . AAA (abdominal aortic aneurysm) ([place])   . Arthritis   . Atrial  fibrillation ([place])   . Coronary artery disease    post CABG x5 --Severe three-vessel coronary disease  . Diabetes mellitus Age 28  . Dysrhythmia   . Hyperlipidemia   . Hypertension   . Irregular heart beat  Feb 18, 2012  . Renal artery stenosis ([place])   . Stroke [facility]) Oct. 1, 2013   Mini stroke- Afib    His Past Surgical History Is Significant For: Past Surgical History:  Procedure Laterality Date  . cabgx5    . CARDIAC CATHETERIZATION  08/26/2005   Est. EF of 65-70% -- Severe three-vessel coronary artery disease -- very heavily calcified vessels and really there is no role for angioplasty  -- he heavily calcified vessels and really there is no role for angioplasty abdominal aortic aneurysm surgery  . CARDIOVERSION  04/15/2012   Procedure: CARDIOVERSION;  Surgeon: Thayer Headings, MD;  Location: [facility];  Service: Cardiovascular;  Laterality: N/A;  . CATARACT EXTRACTION     left eye  . COLON SURGERY     ruptured colon  . CORONARY ARTERY BYPASS GRAFT  09/02/2005   x 5 -- left internal mammary artery graft to the left anterior descending coronary artery, with a saphenous vein graft to the diagonal branch of the LAD, a sequential saphenous vein graft to the second and fourth obtuse marginal branches of the left circumflex coronary artery, and a saphenous vein graft to  the posterior descending branch of RCA  -- Endoscopic vein harvesting from the left leg  . EYE SURGERY Left    Cataract  . EYE SURGERY Right    Cataract  . HAND SURGERY  1997  . LAPAROTOMY  1998  . MEDIAN STERNOTOMY    . PARTIAL KNEE ARTHROPLASTY Left 07/17/2015   Procedure: UNICOMPARTMENTAL KNEE;  Surgeon: Vickey Huger, MD;  Location: [facility];  Service: Orthopedics;  Laterality: Left;  . PR VEIN BYPASS GRAFT,AORTO-FEM-POP  12/23/2005   Infrarenal abdominal aortic aneurysm, right common iliac occlusive disease --   . SKIN GRAFT  1992    His Family History Is Significant For: Family History  Problem Relation Age of  Onset  . Coronary artery disease Mother   . Heart disease Mother        After age 4-Carotid  . Hyperlipidemia Mother   . Stroke Mother   . Coronary artery disease Father   . Hyperlipidemia Father   . Hypertension Father   . Varicose Veins Father   . Heart attack Father   . Coronary artery disease Sister   . Heart disease Sister        After age 52- BP  . Heart attack Sister   . Diabetes Brother   . Heart disease Brother        After 8 yrs of age  . Heart attack Brother   . Hyperlipidemia Brother   . Alzheimer's disease Sister   . Seizures Sister     His Social History Is Significant For: Social History   Socioeconomic History  . Marital status: Married    Spouse name: Not on file  . Number of children: Not on file  . Years of education: Not on file  . Highest education level: Not on file  Occupational History  . Not on file  Tobacco Use  . Smoking status: Former Smoker    Packs/day: 2.00    Years: 44.00    Pack years: 88.00    Types: Cigarettes    Quit date: 05/20/1988    Years since quitting: 30.9  . Smokeless tobacco: Former Systems developer    Quit date: 02/11/2003  Substance and Sexual Activity  . Alcohol use: No    Alcohol/week: 0.0 standard drinks  . Drug use: No  . Sexual activity: Never  Other Topics Concern  . Not on file  Social History Narrative  . Not on file   Social Determinants of Health   Financial Resource Strain:   . Difficulty of Paying Living Expenses: Not on file  Food Insecurity:   . Worried About Charity fundraiser in the Last Year: Not on file  . Ran Out of Food in the Last Year: Not on file  Transportation Needs:   . Lack of Transportation (Medical): Not on file  . Lack of Transportation (Non-Medical): Not on file  Physical Activity:   . Days of Exercise per Week: Not on file  . Minutes of Exercise per Session: Not on file  Stress:   . Feeling of Stress : Not on file  Social Connections:   . Frequency of Communication with Friends  and Family: Not on file  . Frequency of Social Gatherings with Friends and Family: Not on file  . Attends Religious Services: Not on file  . Active Member of Clubs or Organizations: Not on file  . Attends Archivist Meetings: Not on file  . Marital Status: Not on file    His Allergies Are:  No Known Allergies:   His Current Medications Are:  Outpatient Encounter Medications as of 05/06/2019  Medication Sig  . atorvastatin (LIPITOR) 80 MG tablet TAKE 1 TABLET DAILY  . diltiazem (CARDIZEM CD) 120 MG 24 hr capsule TAKE 1 CAPSULE DAILY  . ELIQUIS 5 MG TABS tablet TAKE 1 TABLET TWICE A DAY  . meclizine (ANTIVERT) 25 MG tablet Take 25 mg by mouth 3 (three) times daily as needed for dizziness.  . metFORMIN (GLUCOPHAGE) 500 MG tablet Take 500 mg by mouth 2 (two) times daily with a meal.    . Methylfol-Methylcob-Acetylcyst (L-METHYL-MC NAC) 6-2-600 MG TABS TAKE 1 TABLET BY MOUTH EVERY MORNING  . metoprolol tartrate (LOPRESSOR) 25 MG tablet Take 25 mg by mouth daily.  . tamsulosin (FLOMAX) 0.4 MG CAPS capsule Take 0.4 mg by mouth daily.    No facility-administered encounter medications on file as of 05/06/2019.  :  Review of Systems:  Out of a complete 14 point review of systems, all are reviewed and negative with the exception of these symptoms as listed below: Review of Systems  Neurological:       Pt presents today to discuss his sleep. Pt has had a sleep study many years ago but did not need cpap. Pt does not endorse snoring.  Epworth Sleepiness Scale 0= would never doze 1= slight chance of dozing 2= moderate chance of dozing 3= high chance of dozing  Sitting and reading: 3 Watching TV: 3 Sitting inactive in a public place (ex. Theater or meeting): 3 As a passenger in a car for an hour without a break: 2 Lying down to rest in the afternoon: 2 Sitting and talking to someone: 0 Sitting quietly after lunch (no alcohol): 3 In a car, while stopped in traffic: 0 Total:  16     Objective:  Neurological Exam  Physical Exam Physical Examination:   Vitals:   05/06/19 1327  BP: 123/69  Pulse: 63    General Examination: The patient is a very pleasant 83 y.o. male in no acute distress. He appears well-developed and well-nourished and well groomed.   HEENT: Normocephalic, atraumatic, pupils are equal, round and reactive to light, extraocular tracking is good without limitation to gaze excursion or nystagmus noted. Hearing is Impaired, no hearing aids in place. Face is symmetric with normal facial animation. Speech is clear with no dysarthria noted. There is no hypophonia. There is no lip, neck/head, jaw or voice tremor. Neck is supple with full range of passive and active motion. There are no carotid bruits on auscultation. Oropharynx exam reveals: moderate mouth dryness, adequate dental hygiene With partial dentures and moderate airway crowding secondary to redundant soft palate, Mallampati class Gutierrez, tonsils and uvula not fully visualized.  Tongue protrudes centrally in palate elevates symmetrically.  Neck circumference is 17 inches.  Chest: Clear to auscultation without wheezing, rhonchi or crackles noted.  Heart: S1+S2+0, slightly irregular.   Abdomen: Soft, non-tender and non-distended with normal bowel sounds appreciated on auscultation.  Extremities: There is no pitting edema in the distal lower extremities bilaterally.   Skin: Warm and dry without trophic changes noted.   Musculoskeletal: exam reveals no obvious joint deformities, tenderness or joint swelling or erythema.   Neurologically:  Mental status: The patient is awake, alert and Answers questions appropriately but communication is difficult secondary to hearing loss. Thought process is linear. Mood is normal and affect is normal.  Cranial nerves II - XII are as described above under HEENT exam.  Motor exam: Normal  bulk, strength and tone is noted. There is no tremor, fine motor skills and  coordination: grossly intact.  Cerebellar testing: No dysmetria or intention tremor. There is no truncal or gait ataxia.  Sensory exam: intact to light touch in the upper and lower extremities.  Gait, station and balance: He stands easily. No veering to one side is noted. No leaning to one side is noted. Posture is age-appropriate and stance is narrow based. Gait shows normal stride length and normal pace. No problems turning are noted.   Assessment and Plan:  In summary, SEBASTIN DABDOUB Gutierrez is a very pleasant 83 y.o.-year old male with an underlying medical history of stroke, A. fib, AAA, arthritis, coronary artery disease with status post 5 vessel CABG, renal artery stenosis, hypertension, hyperlipidemia, diabetes, memory loss, hearing loss, dizziness, and mildly overweight state, whose history and physical exam are concerning for obstructive sleep apnea (OSA). I had a long chat with the patient and his wife about my findings and the diagnosis of OSA, its prognosis and treatment options. We talked about medical treatments, surgical interventions and non-pharmacological approaches. I explained in particular the risks and ramifications of untreated moderate to severe OSA, especially with respect to developing cardiovascular disease down the Road, including congestive heart failure, difficult to treat hypertension, cardiac arrhythmias, or stroke. Even type 2 diabetes has, in part, been linked to untreated OSA. Symptoms of untreated OSA include daytime sleepiness, memory problems, mood irritability and mood disorder such as depression and anxiety, lack of energy, as well as recurrent headaches, especially morning headaches. We talked about trying to maintain a healthy lifestyle in general, as well as the importance of weight control. We also talked about the importance of good sleep hygiene. I recommended the following at this time: sleep study, They would prefer a home sleep test for logistical reasons as they  live so far away. I explained the sleep test procedure to the patient. They are familiar with the CPAP treatment option as his wife has had multiple sleep studies and uses a CPAP machine.  He would be willing to try 1. I explained the importance of being compliant with PAP treatment, not only for insurance purposes but primarily to improve His symptoms, and for the patient's long term health benefit, including to reduce His cardiovascular risks. I answered all their questions today and the patient and his wife were in agreement. I plan to see him back after the sleep study is completed and encouraged them to call with any interim questions, concerns, problems or updates.   Thank you very much for allowing me to participate in the care of this nice patient. If I can be of any further assistance to you please do not hesitate to talk to me.  Star Age, MD, PhD

## 2019-05-09 ENCOUNTER — Other Ambulatory Visit: Payer: Self-pay | Admitting: Cardiovascular Disease

## 2019-05-10 ENCOUNTER — Telehealth: Payer: Self-pay | Admitting: Adult Health

## 2019-05-10 DIAGNOSIS — I6389 Other cerebral infarction: Secondary | ICD-10-CM

## 2019-05-10 DIAGNOSIS — R42 Dizziness and giddiness: Secondary | ICD-10-CM

## 2019-05-10 NOTE — Telephone Encounter (Signed)
Called Gabriel Gutierrez in regards to his recent CT head and spoke with both patient and wife.  He was advised that his recent imaging did show area of hypodensity in the left parietal occipital region which could represent subacute infarct.  It is recommended to proceed with further imaging with MRI brain wo contrast as well as vessel imaging with MRI head/neck.  He has not had further episodes of dizziness but wife endorses last week having episode of right eye lower vision loss lasting for approximately 30 minutes and has experienced these episodes prior and was told by his ophthalmologist that it is from "pieces of plaque breaking off".  He does have history of visual aura migraines but recent episode did not experience migraine.  He was advised with any reoccurring symptoms or new/worsening symptoms to call 911 for further evaluation.  All questions answered and patient and wife appreciative of phone call.

## 2019-05-10 NOTE — Telephone Encounter (Signed)
Pt last saw Dr Acie Fredrickson 07/27/18, last labs 01/26/19 Creat 1.31 at [facility] per [place], age 83, weight 78.5kg, based on specified criteria pt is on appropriate dosage of Eliquis 5mg  BID.  Will refill rx.

## 2019-05-18 ENCOUNTER — Telehealth: Payer: Self-pay

## 2019-05-18 ENCOUNTER — Other Ambulatory Visit: Payer: Self-pay | Admitting: Neurology

## 2019-05-18 DIAGNOSIS — I6389 Other cerebral infarction: Secondary | ICD-10-CM

## 2019-05-18 DIAGNOSIS — R413 Other amnesia: Secondary | ICD-10-CM

## 2019-05-18 DIAGNOSIS — Z8673 Personal history of transient ischemic attack (TIA), and cerebral infarction without residual deficits: Secondary | ICD-10-CM

## 2019-05-18 DIAGNOSIS — R29818 Other symptoms and signs involving the nervous system: Secondary | ICD-10-CM

## 2019-05-18 DIAGNOSIS — G4719 Other hypersomnia: Secondary | ICD-10-CM

## 2019-05-18 DIAGNOSIS — R42 Dizziness and giddiness: Secondary | ICD-10-CM

## 2019-05-18 NOTE — Telephone Encounter (Signed)
MD has ordered a HST for patient. Unfortunately, patient's insurance will not cover HST due to his medical history. I have talked to patient and he is agreeable to coming into the sleep lab. I have patient scheduled in January. Can I get an order for a NPSG? Thanks!

## 2019-05-18 NOTE — Telephone Encounter (Signed)
Order has been placed for the patient for in lab sleep study.

## 2019-05-28 DIAGNOSIS — J449 Chronic obstructive pulmonary disease, unspecified: Secondary | ICD-10-CM | POA: Diagnosis not present

## 2019-05-28 DIAGNOSIS — I69392 Facial weakness following cerebral infarction: Secondary | ICD-10-CM | POA: Diagnosis not present

## 2019-05-28 DIAGNOSIS — Z6827 Body mass index (BMI) 27.0-27.9, adult: Secondary | ICD-10-CM | POA: Diagnosis not present

## 2019-05-28 DIAGNOSIS — Z1159 Encounter for screening for other viral diseases: Secondary | ICD-10-CM | POA: Diagnosis not present

## 2019-05-28 DIAGNOSIS — I482 Chronic atrial fibrillation, unspecified: Secondary | ICD-10-CM | POA: Diagnosis not present

## 2019-05-28 DIAGNOSIS — E1122 Type 2 diabetes mellitus with diabetic chronic kidney disease: Secondary | ICD-10-CM | POA: Diagnosis not present

## 2019-05-28 DIAGNOSIS — I10 Essential (primary) hypertension: Secondary | ICD-10-CM | POA: Diagnosis not present

## 2019-05-28 DIAGNOSIS — E782 Mixed hyperlipidemia: Secondary | ICD-10-CM | POA: Diagnosis not present

## 2019-05-28 DIAGNOSIS — E1142 Type 2 diabetes mellitus with diabetic polyneuropathy: Secondary | ICD-10-CM | POA: Diagnosis not present

## 2019-06-05 ENCOUNTER — Ambulatory Visit
Admission: RE | Admit: 2019-06-05 | Discharge: 2019-06-05 | Disposition: A | Discharge status: Home / Self Care | Payer: Medicare Other | Source: Ambulatory Visit | Attending: Adult Health | Admitting: Adult Health

## 2019-06-05 DIAGNOSIS — I6389 Other cerebral infarction: Secondary | ICD-10-CM | POA: Diagnosis not present

## 2019-06-05 DIAGNOSIS — R42 Dizziness and giddiness: Secondary | ICD-10-CM | POA: Diagnosis not present

## 2019-06-05 MED ORDER — GADOBENATE DIMEGLUMINE 529 MG/ML IV SOLN
15.0000 mL | Freq: Once | INTRAVENOUS | Status: AC | PRN
  Administered 2019-06-05: 15 mL via INTRAVENOUS

## 2019-06-09 ENCOUNTER — Telehealth: Payer: Self-pay | Admitting: *Deleted

## 2019-06-09 NOTE — Telephone Encounter (Signed)
I spoke to wife and pt and relayed the results of MRI/MRA.per JM/NP:Please advise patient that his recent MRI and MRA head/neck did not show any additional normalities compared to recent CT scan and vessel imaging did not show any significant stenosis.  They both verbalized understanding.

## 2019-06-09 NOTE — Telephone Encounter (Signed)
-----   Message from Frann Rider, NP sent at 06/07/2019  4:37 PM EST ----- Please advise patient that his recent MRI and MRA head/neck did not show any additional normalities compared to recent CT scan and vessel imaging did not show any significant stenosis

## 2019-06-10 ENCOUNTER — Encounter: Payer: Medicare Other | Admitting: Physical Therapy

## 2019-07-06 ENCOUNTER — Other Ambulatory Visit: Payer: Self-pay

## 2019-07-06 DIAGNOSIS — I10 Essential (primary) hypertension: Secondary | ICD-10-CM

## 2019-07-06 MED ORDER — METOPROLOL TARTRATE 25 MG PO TABS: 25.0000 mg | ORAL_TABLET | Freq: Every day | ORAL | 1 refills | Status: DC

## 2019-07-19 ENCOUNTER — Other Ambulatory Visit: Payer: Self-pay

## 2019-07-19 DIAGNOSIS — I10 Essential (primary) hypertension: Secondary | ICD-10-CM

## 2019-07-19 MED ORDER — METOPROLOL TARTRATE 25 MG PO TABS: 25.0000 mg | ORAL_TABLET | Freq: Every day | ORAL | 0 refills | Status: DC

## 2019-08-02 ENCOUNTER — Other Ambulatory Visit: Payer: Self-pay

## 2019-08-02 ENCOUNTER — Encounter: Payer: Self-pay | Admitting: Adult Health

## 2019-08-02 ENCOUNTER — Ambulatory Visit (INDEPENDENT_AMBULATORY_CARE_PROVIDER_SITE_OTHER): Payer: Medicare Other | Admitting: Adult Health

## 2019-08-02 ENCOUNTER — Telehealth: Payer: Self-pay

## 2019-08-02 VITALS — BP 123/68 | HR 93 | Temp 97.0°F | Ht 66.0 in | Wt 172.0 lb

## 2019-08-02 DIAGNOSIS — R29818 Other symptoms and signs involving the nervous system: Secondary | ICD-10-CM | POA: Diagnosis not present

## 2019-08-02 DIAGNOSIS — I10 Essential (primary) hypertension: Secondary | ICD-10-CM

## 2019-08-02 DIAGNOSIS — I639 Cerebral infarction, unspecified: Secondary | ICD-10-CM | POA: Diagnosis not present

## 2019-08-02 DIAGNOSIS — I4821 Permanent atrial fibrillation: Secondary | ICD-10-CM

## 2019-08-02 DIAGNOSIS — Z8673 Personal history of transient ischemic attack (TIA), and cerebral infarction without residual deficits: Secondary | ICD-10-CM

## 2019-08-02 NOTE — Patient Instructions (Signed)
Continue Eliquis (apixaban) daily  and lipitor  for secondary stroke prevention  Continue to follow up with PCP regarding cholesterol, blood pressure and diabetes management   I will discuss need to sleep test with Dr. Tori Milks nurse - if needed, I will be more than happy to send referral to [facility] as this will be easier for your to go to  Continue to monitor blood pressure at home  Maintain strict control of hypertension with blood pressure goal below 130/90, diabetes with hemoglobin A1c goal below 6.5% and cholesterol with LDL cholesterol (bad cholesterol) goal below 70 mg/dL. I also advised the patient to eat a healthy diet with plenty of whole grains, cereals, fruits and vegetables, exercise regularly and maintain ideal body weight.  Follow up in 6 months or call earlier if needed        Thank you for coming to see [place] at [facility]. I hope we have been able to provide you high quality care today.  You may receive a patient satisfaction survey over the next few weeks. We would appreciate your feedback and comments so that we may continue to improve ourselves and the health of our patients.

## 2019-08-02 NOTE — Telephone Encounter (Signed)
Pt saw [name], NP today and is wondering how much the HST will be to pay out of pocket. They cannot come in for an in-lab study due to transportation issues. Please call.

## 2019-08-02 NOTE — Progress Notes (Signed)
[facility] [address] [place]. [place] [address] [phone]       OFFICE FOLLOW UP VISIT NOTE  [name] Date of Birth:  Dec 22, 1935 Medical Record Number:  [id number]   Referring MD:  Gabriel Gutierrez  Reason for Referral:  Vision and memory loss  HPI: 84 year male who had a transient episode of vision loss in the right eye on 01/28/14. He noticed cloudiness which she describes as a horizontal line in his right eye and he could not see below the line the bottom half but he was able to see above it. This lasted only a few minutes and recovered. He denied any accompanying of following headache for seeing zigzag patterns or lines. He is dizzy had a similar episode in November 2014 when he had bilateral vision loss involving the lower half of the field which also lasted only a minute or so. He did not seek any medical help her evaluation in that time period. She denies any chronic headaches, muscle aches, pain, scalp tenderness or jaw claudication. He was seen by me in October 2013 when he had a right parietal insular cortex infarct and was diagnosed with new-onset atrial fibrillation. He is done well from that stroke and recovered completely. He has been on Xarelto of and has been tolerated well without bleeding or bruising. He is in a complain of memory loss particularly not remembering dates and names as well as his hands. He gets frustrated from this and got treated recently. He however remains independent of activities of daily living. His wife handles the finances but the patient can drive and manage his own affairs. He states his blood pressure control has been quite good and his fasting sugars usually run below 120. Update 06/07/2014 : He returns for follow-up after last visit 6 months ago. He continues to do well and has not had a recurrent stroke or TIA symptoms. He saw ophthalmologist Dr. Samara Gutierrez who felt he must have had cholesterol emboli. The patient did  undergo MRI scan of the brain on 02/14/14 which showed no acute abnormality with changes of chronic microvascular ischemia and generalized cerebral atrophy. MRA of the brain and neck did not reveal significant extra or intracranial vascular stenosis. Lab work  On 02/02/14 showed normal vitamin B12, TSH and RPR. Today on the Mini-Mental Status exam score 28/30. He had a colonoscopy yesterday and had held his xarelto for 5 days. He had 3 polyps removed and plans on restarting Xarelto he has not been doing mentally challenging exercises. Update 12/06/2014 : He returns for follow-up after last visit 6 months ago. He is accompanied by his wife feels that his memory may be somewhat better. He is taking Cerefolin but finds it quite expensive. He continues to have trouble remembering mainly recent information the memory from the past seems quite good. He is independent in activities of daily living. There have been no changes in behavior, agitation, hallucinations or nightmares or delusions noted. He continues to be bothered by left knee pain and wears a knee brace. He has not had any recurrent stroke or TIA symptoms and remains on Xarelto which is tolerating well without significant bleeding or bruising. His blood pressure seems well controlled. Lipid profile has not been checked recently. Update 06/20/2015:  He returns for follow-up after last visit 6 months ago. He is accompanied by son. He states is doing well and his memory is quite stable. Acute symptoms are quite active. He does mentally challenging  activities like crossword puzzles. Is physically also quite active. He unfortunately is having severe left knee pain and requires knee replacement which is scheduled for a couple of weeks. He remains on Xarelto which is tolerating well without side effects. He had taken Cerefolin which I had prescribed at last visit but when he ran out of the prescription his did not call for refill. Update 12/20/2015 : He returns for  follow-up after last visit 6 months ago. Is accompanied by his wife. He remained stable from neurovascular standpoint without recurrent TIA or stroke symptoms. He did undergo knee replacement successfully and is back on Xarelto which is tolerating well with only minor bruising but no bleeding. He unfortunately lost his 53-year-old daughter as well as his diet recently. He continues to have short-term memory difficulties and very occasionally word finding as well. He had run out of Cerefolin nac noticed that his cognitive difficulties got worse but now he  is back on it`s generic version and feels better. He did not start Resevetrol as had recommended last time. He does keep himself mentally challenged by taking on building or painting projects.. He states his blood pressure is well controlled and today it is 109/63. He has no new complaints today. Update 12/19/2016 ; he returns for follow-up after last visit a year ago. He is accompanied by his wife. Continues to have mild short-term memory difficulties which appear to be unchanged. He had partial knee replacement done by Dr. Lorre Gutierrez which has gone well. He is concerned about his kidneys but recent lab work from 11/29/16 shows creatinine 1.16 but potassium is elevated at 5.7 for which a follow-up lab work is scheduled for next week. LDLcholesterol was 53 mg percent , triglycerides 104 and total cholesterol 119 mg percent. Patient continues to take care of meta-folbate plus which is generic form of Cerefolin nac  as it is cheaper. He does participate in cognitively challenging activities. He was on Xarelto which was switched to eliquis due to concerns about renal insufficiency 6 months ago. He is tolerating it well without bruising or bleeding. Has not had any recurrent TIA or stroke symptoms since 2013. He has no new complaints today. His blood pressure is well controlled today at 124/70. He does have a very strong family history of Alzheimer's and is worried about  developing it himself Update 01/26/2018 : she returns for follow-up after last visit a year ago. Is accompanied by his son. He continues to do well from neurovascular standpoint without recurrent TIA or stroke symptoms. His memory difficulties also appear to be unchanged. He has had no recent changes. He remains on eliquis which is tolerating well without bleeding and only minor bruising. Is tolerating Lipitor well without any side effects. His sugar and blood pressure both symptoms are well controlled. He has no new complaints today. On Mini-Mental status exam testing this could 29/30 which is stable from last visit. Update 01/26/2018 : he returns for follow-up after last visit a year ago. He is accompanied by his son. He continues to do well and has not had recurrent TIA or stroke symptoms. His short-term memory difficulties also continued to be unchanged. He has trouble remembering recent information. He however remains quite independent in activities of daily living. On the Mini-Mental status exam score today scored 29/30 with only one deficit in attention. He was able to name 20 animals with food legs. He was able to copy intersecting pentagons. He has no new complaints today. Update 02/08/2019 PS: He returns  for follow-up after last visit a year ago.  He is accompanied by his son.  Patient continues to have short-term memory and mild cognitive difficulties but these appear to be unchanged.  He is living at home with his wife.  He is independent with activities of daily living.  He is not driving.  He does do daily crossword puzzles.  He remains on Cerefolin NAC which is taking daily.  Patient's sister as well as 1 aunt had Alzheimer's.  He had follow-up carotid ultrasound done on 02/04/2019 at [facility] which showed no significant bilateral carotid stenosis.  He has no new complaints.  Update 04/26/2019 JM: [name] is a 84 year old male who is being seen today by patient/family  request due to 2-week onset of dizziness.  Recently seen in this office by Dr. Leonie Man on 02/08/2019 for follow-up regarding cognitive impairment.  He describes 1 episode recently while sitting in the car waiting for his wife at the dentist office where he felt as though the building in front of him was spinning/moving and then felt recently he was getting pulled to the left where he had to physically adjust himself upright.  This sensation lasted approximately 5 minutes and then "felt hot" followed by episode of nausea with vomiting.  After episode of emesis, all symptoms resolved.  He has not experienced any recurrent episodes.  He does report short episodes of vertigo/room spinning sensation with quick head changes.  He also endorses lightheadedness/dizziness sensation with quickly standing or changing positions and will shortly improve.  He was seen by his PCP who referred him to ENT and initiated meclizine.  He will take meclizine as needed with benefit.  He has initial evaluation with ENT at the end of this month.  Orthostatics initially obtained by [facility] which actually showed increased levels when going from supine, sitting and then standing.  Repeated blood pressures from sitting to standing with sitting 142/62 and standing 122/58.  He does report hearing loss bilaterally and has been using hearing aids for the past year.  He denies recent change of medications or diet.  Blood pressure not monitored during episodes.  He denies prior symptoms of vertigo or dizziness.  He denies vision changes, speech changes, weakness or numbness/tingling.  He has remained on Eliquis 5 mg twice daily and atorvastatin 80 mg daily for secondary stroke prevention.  Son also questions possible risk for sleep apnea as patient does have daytime fatigue with frequent napping and snoring.  He has not previously underwent sleep study.  His son has been diagnosed with sleep apnea with use of CPAP machine for treatment  Update 08/02/2019  JM: [name] is a 84 year old male who is being seen today for follow-up accompanied by his wife. Due to acute onset of vertigo symptoms after prior visit, imaging obtained which showed right basal ganglia/internal capsule infarct new from prior imaging likely secondary to small vessel disease as well as moderate generalized cortical atrophy, moderate chronic microvascular ischemic changes and single microhemorrhage in the right cerebellar hemisphere. Vessel imaging unremarkable.  He will occasionally have dizziness episodes but this is greatly improved.  Cognition has slightly worsened since prior visit with MMSE 23/30 but has not impacted her daily functioning. Continues on Eliquis 5 mg twice daily and atorvastatin 80 mg daily for secondary stroke prevention. Blood pressure today 123/68. He was evaluated by [facility] Dr. Rexene Alberts for further evaluation of suspected sleep apnea who recommended proceeding with sleep study but this has  not been completed due to difficulty with transportation and living approximately 40 minutes away.  No further concerns at this time.     ROS:   14 system review of systems is positive for dizziness, short-term memory loss and all other systems negative  PMH:  Past Medical History:  Diagnosis Date  . AAA (abdominal aortic aneurysm) ([place])   . Arthritis   . Atrial fibrillation ([place])   . Coronary artery disease    post CABG x5 --Severe three-vessel coronary disease  . Diabetes mellitus Age 21  . Dysrhythmia   . Hyperlipidemia   . Hypertension   . Irregular heart beat  Feb 18, 2012  . Renal artery stenosis ([place])   . Stroke [facility]) Oct. 1, 2013   Mini stroke- Afib    Social History:  Social History   Socioeconomic History  . Marital status: Married    Spouse name: Not on file  . Number of children: Not on file  . Years of education: Not on file  . Highest education level: Not on file  Occupational History  . Not on file  Tobacco Use  . Smoking status:  Former Smoker    Packs/day: 2.00    Years: 44.00    Pack years: 88.00    Types: Cigarettes    Quit date: 05/20/1988    Years since quitting: 31.2  . Smokeless tobacco: Former Systems developer    Quit date: 02/11/2003  Substance and Sexual Activity  . Alcohol use: No    Alcohol/week: 0.0 standard drinks  . Drug use: No  . Sexual activity: Never  Other Topics Concern  . Not on file  Social History Narrative  . Not on file   Social Determinants of Health   Financial Resource Strain:   . Difficulty of Paying Living Expenses:   Food Insecurity:   . Worried About Charity fundraiser in the Last Year:   . Arboriculturist in the Last Year:   Transportation Needs:   . Film/video editor (Medical):   [name] Lack of Transportation (Non-Medical):   Physical Activity:   . Days of Exercise per Week:   . Minutes of Exercise per Session:   Stress:   . Feeling of Stress :   Social Connections:   . Frequency of Communication with Friends and Family:   . Frequency of Social Gatherings with Friends and Family:   . Attends Religious Services:   . Active Member of Clubs or Organizations:   . Attends Archivist Meetings:   [name] Marital Status:   Intimate Partner Violence:   . Fear of Current or Ex-Partner:   . Emotionally Abused:   [name] Physically Abused:   . Sexually Abused:     Medications:   Current Outpatient Medications on File Prior to Visit  Medication Sig Dispense Refill  . atorvastatin (LIPITOR) 80 MG tablet TAKE 1 TABLET DAILY 90 tablet 3  . diltiazem (CARDIZEM CD) 120 MG 24 hr capsule TAKE 1 CAPSULE DAILY 90 capsule 3  . ELIQUIS 5 MG TABS tablet TAKE 1 TABLET TWICE A DAY 180 tablet 1  . metFORMIN (GLUCOPHAGE) 500 MG tablet Take 500 mg by mouth 2 (two) times daily with a meal.      . Methylfol-Methylcob-Acetylcyst (L-METHYL-MC NAC) 6-2-600 MG TABS TAKE 1 TABLET BY MOUTH EVERY MORNING 90 tablet 3  . metoprolol tartrate (LOPRESSOR) 25 MG tablet Take 1 tablet (25 mg total) by mouth daily.  30 tablet 0  . tamsulosin (FLOMAX)  0.4 MG CAPS capsule Take 0.4 mg by mouth daily.      No current facility-administered medications on file prior to visit.    Allergies:  No Known Allergies  Physical Exam  Today's Vitals   08/02/19 1553  BP: 123/68  Pulse: 93  Temp: (!) 97 F (36.1 C)  Weight: 172 lb (78 kg)  Height: 5\' 6"  (1.676 m)   Orthostatic VS for the past 72 hrs (Last 3 readings):  Patient Position BP Location Cuff Size  08/02/19 1553 Sitting Right Arm Normal    Body mass index is 27.76 kg/m.  General: well developed, well nourished very pleasant elderly male, seated, in no evident distress Head: head normocephalic and atraumatic.  Neck: supple with no carotid or supraclavicular bruits Cardiovascular: irregular rate and rhythm, no murmurs Musculoskeletal: no deformity Skin:  no rash/petichiae Vascular:  Normal pulses all extremities  Neurologic Exam Mental Status: Awake and fully alert. Oriented to place and time. Recent memory impaired and remote memory intact. Attention span, concentration and fund of knowledge appropriate during visit. Mood and affect appropriate.  MMSE - Mini Mental State Exam 08/02/2019 01/26/2018 12/19/2016  Orientation to time 4 5 4   Orientation to Place 4 5 5   Registration 3 3 3   Attention/ Calculation 0 4 5  Recall 3 3 3   Language- name 2 objects 2 2 2   Language- repeat 1 1 1   Language- follow 3 step command 3 3 3   Language- read & follow direction 1 1 1   Write a sentence 1 1 1   Copy design 1 1 1   Total score 23 29 29    Cranial Nerves: Pupils equal, briskly reactive to light. Extraocular movements full without nystagmus. Visual fields full to confrontation. Hearing diminished bilaterally. Facial sensation intact. Face, tongue, palate moves normally and symmetrically.  Motor: Normal bulk and tone. Normal strength in all tested extremity muscles. Sensory.: intact to touch and pinprick and vibratory sensation.  Coordination: Rapid  alternating movements normal in all extremities. Finger-to-nose and heel-to-shin performed accurately bilaterally. Gait and Station: Arises from chair without difficulty. Stance is normal. Gait demonstrates normal stride length and balance without assistive device. Reflexes: 1+ and symmetric. Toes downgoing.     ASSESSMENT: 84 year male with prior history of right MCA stroke 02/2012, atrial fibrillation on Eliquis, possible TIAs and cognitive impairment for which he is routinely followed in this office. After prior visit, complaints of acute episode of vertigo therefore imaging obtained which showed right basal ganglia/internal capsule infarct new from prior imaging likely secondary to small vessel disease.  He does report occasional vertigo but overall improving.  Cognition slightly worsened with MMSE 23/30 but continues to function without difficulty    PLAN: -Continue Eliquis and atorvastatin for secondary stroke prevention -Continue to follow with PCP for HTN and HLD management -discussion regarding importance of managing stroke risk factors -Continue to follow with cardiology for atrial fibrillation and Eliquis management -Highly encouraged pursuing additional testing for suspected sleep apnea due to increased risk for reoccurring stroke, history and cardiovascular concerns.  Wife question pursuing home sleep study and pay out of pocket as insurance would not cover -will discuss further with [facility].  If not able to proceed with home sleep study, wife questioned if sleep study can be pursued at [facility] -referral will be placed if indicated  Follow-up in 6 months or call earlier if needed   I spent 26 minutes of face-to-face and non-face-to-face time with patient.  This included previsit chart  review, lab review, study review, order entry, electronic health record documentation, patient education   Frann Rider, [facility]  [facility] [address] [place], [place] [address]  Phone [phone] Fax [phone] Note: This document was prepared with digital dictation and possible smart phrase technology. Any transcriptional errors that result from this process are unintentional.

## 2019-08-11 NOTE — Addendum Note (Signed)
Addended by: Mal Misty on: 08/11/2019 12:19 PM   Modules accepted: Orders

## 2019-08-11 NOTE — Telephone Encounter (Signed)
Referral placed to [facility] for evaluation of potential sleep apnea as requested by wife.

## 2019-08-11 NOTE — Telephone Encounter (Signed)
Finally able to speak with wife of patient after multiple attempts to contact patient. Talked to wife about out of pocket cost of HST. She stated that they would really like to establish with a sleep doctor closer to home. Offered mail out HST and pt nor wife have a smart phone to use with that device. They also have transportation issues as well. Wife stated that at last vist, Raelyn Number NP mentioned sending a referral to [facility]. Stated that office is in much closer proximity for them to get to. Will follow up with her to see if that has been sent over yet.

## 2019-08-12 ENCOUNTER — Telehealth: Payer: Self-pay | Admitting: Legal Medicine

## 2019-08-12 NOTE — Chronic Care Management (AMB) (Deleted)
Chronic Care Management Pharmacy  Name: Gabriel Gutierrez  MRN: [id number] DOB: February 21, 1936  Chief Complaint/ HPI  Gabriel Gutierrez,  84 y.o. , male presents for their Initial CCM visit with the clinical pharmacist via telephone due to COVID-19 Pandemic.  PCP : Lillard Anes, MD  Their chronic conditions include: CAD, HTN, AAA, Afib, DM, HLD, hx of stroke,  Office Visits: 05/28/2019 - no med changes.  03/30/2019 - evaluated for dizziness.Meclizine ordered.   Consult Visit: 08/02/2018 - neuro f/u for dizziness. MRI/MRA of head and neck. No additional abnormalities.   05/10/2019 -  CT of head showing hypodensity that could represent subacute infarct.   05/06/2019 - Dr. Rexene Alberts at [facility] saw for possible sleep apnea. Sleep study recommended.   04/26/19 - neuro evaluation of recent dizziness Working on sleep study for possible apnea.   Medications: Outpatient Encounter Medications as of 08/16/2019  Medication Sig  . atorvastatin (LIPITOR) 80 MG tablet TAKE 1 TABLET DAILY  . diltiazem (CARDIZEM CD) 120 MG 24 hr capsule TAKE 1 CAPSULE DAILY  . ELIQUIS 5 MG TABS tablet TAKE 1 TABLET TWICE A DAY  . metFORMIN (GLUCOPHAGE) 500 MG tablet Take 500 mg by mouth 2 (two) times daily with a meal.    . Methylfol-Methylcob-Acetylcyst (L-METHYL-MC NAC) 6-2-600 MG TABS TAKE 1 TABLET BY MOUTH EVERY MORNING  . metoprolol tartrate (LOPRESSOR) 25 MG tablet Take 1 tablet (25 mg total) by mouth daily.  . tamsulosin (FLOMAX) 0.4 MG CAPS capsule Take 0.4 mg by mouth daily.    No facility-administered encounter medications on file as of 08/16/2019.     Current Diagnosis/Assessment:  Goals Addressed   None     Diabetes   Lab Results  Component Value Date   CREATININE 1.14 07/18/2017   CREATININE 1.38 (H) 12/19/2016   CREATININE 1.47 (H) 11/12/2016    Recent Relevant Labs: Lab Results  Component Value Date/Time   HGBA1C 6.3 (H) 07/17/2015 05:38 PM   HGBA1C 6.2 (H) 02/18/2012 04:29 PM      Checking BG: {CHL HP Blood Glucose Monitoring Frequency:[id number]}  Recent FBG Readings: Recent pre-meal BG readings: *** Recent 2hr PP BG readings:  *** Recent HS BG readings: *** Patient has failed these meds in past: *** Patient is currently {CHL Controlled/Uncontrolled:[phone]} on the following medications: metformin 500 mg bid  Last diabetic Foot exam: No results found for: HMDIABEYEEXA  Last diabetic Eye exam: No results found for: HMDIABFOOTEX   We discussed: {CHL HP Upstream Pharmacy [phone]}  Plan  Continue {CHL HP Upstream Pharmacy Plans:[phone]} and  Hypertension   BP today is:  {CHL HP UPSTREAM Pharmacist BP ranges:[phone]}  Office blood pressures are  BP Readings from Last 3 Encounters:  08/02/19 123/68  05/06/19 123/69  02/08/19 130/70    Patient has failed these meds in the past: *** Patient is currently controlled/uncontrolled*** on the following medications: metoprolol tartrate 25 mg tablet, diltiazem er 120 mg daily  Patient checks BP at home {CHL HP BP Monitoring Frequency:[phone]}  Patient home BP readings are ranging: ***  We discussed {CHL HP Upstream Pharmacy [phone]}  Plan  Continue {CHL HP Upstream Pharmacy [id number]   Hyperlipidemia/CAD   Managed by Dr. Acie Fredrickson  Lipid Panel     Component Value Date/Time   CHOL 142 07/18/2017 1003   TRIG 121 07/18/2017 1003   HDL 56 07/18/2017 1003   CHOLHDL 2.5 07/18/2017 1003   CHOLHDL 2.6 02/18/2012 1629   VLDL 17 02/18/2012 1629   LDLCALC 62  07/18/2017 1003   LABVLDL 24 07/18/2017 1003     Patient has failed these meds in past: *** Patient is currently {CHL Controlled/Uncontrolled:[phone]} on the following medications: Eliquis 5 mg bid, atorvastatin 80 mg daily,   We discussed:  {CHL HP Upstream Pharmacy discussion:[id number]}  Plan  Continue {CHL HP Upstream Pharmacy [phone]}   BPH???***   Patient has failed these meds  in past: *** Patient is currently {CHL Controlled/Uncontrolled:[phone]} on the following medications: tamsulosin 0.4 mg daily.  We discussed:  {CHL HP Upstream Pharmacy discussion:[id number]}  Plan  Continue {CHL HP Upstream Pharmacy [phone]}   Health Maintenance   Patient is currently {CHL Controlled/Uncontrolled:[phone]} on the following medications: ***  We discussed:  {CHL HP Upstream Pharmacy discussion:[id number]}  Plan  Continue {CHL HP Upstream Pharmacy [phone]}   Medication Management   Pt uses [facility] mail-order pharmacy for all medications ***pill box Pt endorses ***% compliance  We discussed:  {CHL HP Upstream Pharmacy discussion:[id number]}  Plan  ***     Follow up: ***

## 2019-08-12 NOTE — Chronic Care Management (AMB) (Signed)
  Chronic Care Management   Note  08/12/2019 Name: Gabriel Gutierrez MRN: [id number] DOB: 28-Sep-1935  Gabriel Gutierrez is a 84 y.o. year old male who is a primary care patient of Lillard Anes, MD. I reached out to Gabriel Gutierrez by phone today in response to a referral sent by [name]'s PCP, Lillard Anes, MD.   [name] was given information about Chronic Care Management services today including:  1. CCM service includes personalized support from designated clinical staff supervised by his physician, including individualized plan of care and coordination with other care providers 2. 24/7 contact phone numbers for assistance for urgent and routine care needs. 3. Service will only be billed when office clinical staff spend 20 minutes or more in a month to coordinate care. 4. Only one practitioner may furnish and bill the service in a calendar month. 5. The patient may stop CCM services at any time (effective at the end of the month) by phone call to the office staff.   Patient agreed to services and verbal consent obtained.   Follow up plan:   Earney [place] [facility] Scheduler

## 2019-08-16 ENCOUNTER — Telehealth: Payer: Medicare Other

## 2019-08-20 ENCOUNTER — Ambulatory Visit: Payer: Medicare Other | Attending: Internal Medicine

## 2019-08-20 DIAGNOSIS — Z23 Encounter for immunization: Secondary | ICD-10-CM

## 2019-08-20 NOTE — Progress Notes (Signed)
   Covid-19 Vaccination Clinic  Name:  Gabriel Gutierrez    MRN: [id number] DOB: 1935/07/28  08/20/2019  [name] was observed post Covid-19 immunization for 15 minutes without incident. He was provided with Vaccine Information Sheet and instruction to access the V-Safe system.   [name] was instructed to call 911 with any severe reactions post vaccine: [name] Difficulty breathing  . Swelling of face and throat  . A fast heartbeat  . A bad rash all over body  . Dizziness and weakness   Immunizations Administered    Name Date Dose VIS Date Route   Pfizer COVID-19 Vaccine 08/20/2019 10:33 AM 0.3 mL 04/30/2019 Intramuscular   Manufacturer: [place]   Lot: [id number]   [place]: [id number]

## 2019-08-20 NOTE — Progress Notes (Signed)
I agree with the above plan 

## 2019-08-30 ENCOUNTER — Other Ambulatory Visit: Payer: Self-pay | Admitting: Cardiovascular Disease

## 2019-09-15 ENCOUNTER — Ambulatory Visit: Payer: Medicare Other | Attending: Internal Medicine

## 2019-09-15 DIAGNOSIS — Z23 Encounter for immunization: Secondary | ICD-10-CM

## 2019-09-15 NOTE — Progress Notes (Signed)
   Covid-19 Vaccination Clinic  Name:  Gabriel Gutierrez    MRN: [id number] DOB: Oct 06, 1935  09/15/2019  [name] was observed post Covid-19 immunization for 15 minutes without incident. He was provided with Vaccine Information Sheet and instruction to access the V-Safe system.   [name] was instructed to call 911 with any severe reactions post vaccine: [name] Difficulty breathing  . Swelling of face and throat  . A fast heartbeat  . A bad rash all over body  . Dizziness and weakness   Immunizations Administered    Name Date Dose VIS Date Route   Pfizer COVID-19 Vaccine 09/15/2019 12:46 PM 0.3 mL 07/14/2018 Intramuscular   Manufacturer: [place]   Lot: [id number]   [place]: [id number]

## 2019-09-20 DIAGNOSIS — H02811 Retained foreign body in right upper eyelid: Secondary | ICD-10-CM | POA: Diagnosis not present

## 2019-09-21 ENCOUNTER — Other Ambulatory Visit: Payer: Self-pay | Admitting: Cardiovascular Disease

## 2019-09-22 DIAGNOSIS — E875 Hyperkalemia: Secondary | ICD-10-CM | POA: Diagnosis not present

## 2019-09-22 DIAGNOSIS — I129 Hypertensive chronic kidney disease with stage 1 through stage 4 chronic kidney disease, or unspecified chronic kidney disease: Secondary | ICD-10-CM | POA: Diagnosis not present

## 2019-09-22 DIAGNOSIS — N183 Chronic kidney disease, stage 3 unspecified: Secondary | ICD-10-CM | POA: Diagnosis not present

## 2019-09-24 ENCOUNTER — Ambulatory Visit (INDEPENDENT_AMBULATORY_CARE_PROVIDER_SITE_OTHER): Payer: Medicare Other | Admitting: Cardiovascular Disease

## 2019-09-24 ENCOUNTER — Other Ambulatory Visit: Payer: Self-pay

## 2019-09-24 ENCOUNTER — Encounter: Payer: Self-pay | Admitting: Cardiovascular Disease

## 2019-09-24 VITALS — BP 128/62 | HR 78 | Ht 66.0 in | Wt 173.5 lb

## 2019-09-24 DIAGNOSIS — I482 Chronic atrial fibrillation, unspecified: Secondary | ICD-10-CM | POA: Diagnosis not present

## 2019-09-24 DIAGNOSIS — I2510 Atherosclerotic heart disease of native coronary artery without angina pectoris: Secondary | ICD-10-CM

## 2019-09-24 DIAGNOSIS — I10 Essential (primary) hypertension: Secondary | ICD-10-CM | POA: Diagnosis not present

## 2019-09-24 DIAGNOSIS — E782 Mixed hyperlipidemia: Secondary | ICD-10-CM | POA: Diagnosis not present

## 2019-09-24 DIAGNOSIS — I6523 Occlusion and stenosis of bilateral carotid arteries: Secondary | ICD-10-CM | POA: Diagnosis not present

## 2019-09-24 NOTE — Patient Instructions (Signed)

## 2019-09-24 NOTE — Progress Notes (Signed)
Gabriel Gutierrez Date of Birth  [birth year] [facility] [address]    [place], [place]  [address] [phone]  Fax  [phone]  Problem list: 1. Coronary artery disease-status post CABG 2. Abdominal aortic aneurysm-Status post stent grafting 3. Diabetes mellitus 4. Dyslipidemia 5. Hypertension 6. Atrial fibrillation -  We have tried cardioversion in the past but it lasted only 1 week.  7. History of stroke   Previous Notes:  Gabriel Gutierrez is a 84 yo gentleman with a history of coronary artery disease-status post coronary artery bypass grafting in 2007. He has a history of hypertension.   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    He was recently admitted to the hospital with an acute stroke. He was also found to have rapid atrial fibrillation. He stroke symptoms resolve fairly quickly and he does not have much of the deficit. He remains in atrial fibrillation.  He's been maintained on Xarelto.  He's completely asymptomatic from a cardiac standpoint. He cannot tell that his heart rate is beating irregularly.  Dec. 19, 2013 He had a cardioversion performed last month by Dr. Irish Lack who was kind enough to perform the cardioversion after I got tied up in the office. He is completely asymptomatic. He cannot tell whether his heart is in atrial fibrillation or normal sinus rhythm. He has been able to do all of his normal activities without difficulty.  August 10, 2012:  Gabriel Gutierrez is doing well.  His potassium levels have been running high. He had blood checked at his medical doctors office in February and his potassium level was 5.8. He discontinued the benazepril / HCT. Recheck potassium was even higher. He had it checked again this week with results are pending.  He is cutting out all high potassium foods.   He had a cardioversion in   Dec. 11,  2014:  Gabriel Gutierrez is doing well.  Active , works hard every day, no CP.  No dyspnea.  Sleeping well at night.  Completely asymptomatic from an a-fib standpoint.   October 27, 2013: Gabriel Gutierrez is doing ok.    Not able to do as much as he would like.  Still very active - plays golf,  Walking some .  Has some knee problems that limits him.    Dec. 7, 2015: Gabriel Gutierrez is seen today for follow up of his CAD, AAA repair, atrial fib., and dyslipidemia.  He also has a hx of DM and a CVA.   November 08, 2014:  Doing well from a cardiac standpoint. Trouble with knees.  Has had a stroke - on xarelto .  Had an occular stroke  Jan. 23. 2017 Gabriel Gutierrez is doing well from a cardiac standpoint. Needs partial replacement of the left knee Doing great.  No CP,  Has some DOE with significant exertion. No DOE doing regular chores  Jan. 29, 2018: No cardiac issues over the past year.  Has some left temple pain .  BP and HR are normal  July 18, 2017 Seen with granddaughter, Gabriel Gutierrez (cardiac nurse [facility] )  Staying active.  Is slowing down.   Just getting over pneumonia.  Gets lots of yard work.   Plays golf regularly  No CP or dypsena ( was short of breath with pneumonia )   July 27, 2018:  Gabriel Gutierrez is seen with  wife, Gabriel Gutierrez today  Has some fatigue,  No CP , mild dyspena  has had some nose bleeds occurs after blowing his nose  He saw a nephrologist in January.  His creatinine was 1.15 at that time.  Hemoglobin was 13.9.  LDL was 64.  HDL is 45.  Total cholesterol is 136.  Triglycerides are 104.  Sep 24, 2019  Gabriel Gutierrez is seen today for follow up of his CAD, atrial fib,.  He has a hx of AAA -s/p aortobiiliac Cook Zenith stent graft placed by Dr. Kellie Simmering in 2007 for abdominal aortic aneurysm. Active.  Played golf yesterday .   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 2021:   CHol = 116 HDL = 43 LDL = 70 Trig = 107   No CP, not limited by dyspnea     Current Outpatient  Medications on File Prior to Visit  Medication Sig Dispense Refill  . atorvastatin (LIPITOR) 80 MG tablet TAKE 1 TABLET DAILY. (PLEASE MAKE OVERDUE APPOINTMENT WITH DR. Cathie Olden BEFORE ANYMORE REFILLS. [phone]. 1ST ATTEMPT) 15 tablet 0  . diltiazem (CARDIZEM CD) 120 MG 24 hr capsule TAKE 1 CAPSULE DAILY 90 capsule 3  . ELIQUIS 5 MG TABS tablet TAKE 1 TABLET TWICE A DAY 180 tablet 1  . metFORMIN (GLUCOPHAGE) 500 MG tablet Take 500 mg by mouth 2 (two) times daily with a meal.      . Methylfol-Methylcob-Acetylcyst (L-METHYL-MC NAC) 6-2-600 MG TABS TAKE 1 TABLET BY MOUTH EVERY MORNING 90 tablet 3  . metoprolol tartrate (LOPRESSOR) 25 MG tablet Take 1 tablet (25 mg total) by mouth daily. 30 tablet 0  . tamsulosin (FLOMAX) 0.4 MG CAPS capsule Take 0.4 mg by mouth daily.      No current facility-administered medications on file prior to visit.    No Known Allergies  Past Medical History:  Diagnosis Date  . AAA (abdominal aortic aneurysm) ([place])   . Arthritis   . Atrial fibrillation ([place])   . Coronary artery disease    post CABG x5 --Severe three-vessel coronary disease  . Diabetes mellitus Age 37  . Dysrhythmia   . Hyperlipidemia   . Hypertension   . Irregular heart beat  Feb 18, 2012  . Renal artery stenosis ([place])   . Stroke [facility]) Oct. 1, 2013   Mini stroke- Afib    Past Surgical History:  Procedure Laterality Date  . cabgx5    . CARDIAC CATHETERIZATION  08/26/2005   Est. EF of 65-70% -- Severe three-vessel coronary artery disease -- very heavily calcified vessels and really there is no role for angioplasty  -- he heavily calcified vessels and really there is no role for angioplasty abdominal aortic aneurysm surgery  . CARDIOVERSION  04/15/2012   Procedure: CARDIOVERSION;  Surgeon: Thayer Headings, MD;  Location: [facility];  Service: Cardiovascular;  Laterality: N/A;  . CATARACT EXTRACTION     left eye  . COLON SURGERY     ruptured colon  . CORONARY ARTERY BYPASS GRAFT   09/02/2005   x 5 -- left internal mammary artery graft to the left anterior descending coronary artery, with a saphenous vein graft to the diagonal branch of the LAD, a sequential saphenous vein graft to the second and fourth obtuse marginal branches of the left circumflex coronary artery, and a saphenous vein graft to the posterior descending branch of RCA  -- Endoscopic vein harvesting from the left leg  . EYE SURGERY Left  Cataract  . EYE SURGERY Right    Cataract  . HAND SURGERY  1997  . LAPAROTOMY  1998  . MEDIAN STERNOTOMY    . PARTIAL KNEE ARTHROPLASTY Left 07/17/2015   Procedure: UNICOMPARTMENTAL KNEE;  Surgeon: Vickey Huger, MD;  Location: [facility];  Service: Orthopedics;  Laterality: Left;  . PR VEIN BYPASS GRAFT,AORTO-FEM-POP  12/23/2005   Infrarenal abdominal aortic aneurysm, right common iliac occlusive disease --   . SKIN GRAFT  1992    Social History   Tobacco Use  Smoking Status Former Smoker  . Packs/day: 2.00  . Years: 44.00  . Pack years: 88.00  . Types: Cigarettes  . Quit date: 05/20/1988  . Years since quitting: 31.3  Smokeless Tobacco Former Systems developer  . Quit date: 02/11/2003    Social History   Substance and Sexual Activity  Alcohol Use No  . Alcohol/week: 0.0 standard drinks    Family History  Problem Relation Age of Onset  . Coronary artery disease Mother   . Heart disease Mother        After age 54-Carotid  . Hyperlipidemia Mother   . Stroke Mother   . Coronary artery disease Father   . Hyperlipidemia Father   . Hypertension Father   . Varicose Veins Father   . Heart attack Father   . Coronary artery disease Sister   . Heart disease Sister        After age 72- BP  . Heart attack Sister   . Diabetes Brother   . Heart disease Brother        After 98 yrs of age  . Heart attack Brother   . Hyperlipidemia Brother   . Alzheimer's disease Sister   . Seizures Sister     Reviw of Systems:  Reviewed in the HPI.  All other systems are  negative.  Physical Exam: Blood pressure 128/62, pulse 78, height 5\' 6"  (1.676 m), weight 173 lb 8 oz (78.7 kg), SpO2 96 %.  GEN:  Well nourished, well developed in no acute distress HEENT: Normal NECK: No JVD; No carotid bruits LYMPHATICS: No lymphadenopathy CARDIAC:  Irreg. Irreg.  RESPIRATORY:  Clear to auscultation without rales, wheezing or rhonchi  ABDOMEN: Soft, non-tender, non-distended MUSCULOSKELETAL:  No edema; No deformity  SKIN: Warm and dry NEUROLOGIC:  Alert and oriented x 3   ECG: May 7 , 2021:   Afib with controlled V response    Assessment / Plan:   1. Coronary artery disease-status post CABG -seems to be doing very well.  No angina. Remains very active.  Is not had any episodes of angina.   2. Abdominal aortic aneurysm-   Stable.  We will continue with aggressive treatment of his hypertension.  3. Diabetes mellitus  4. Dyslipidemia -   most recent labs from January, 2021 look great.  Continue current medications.    5. Hypertension -    blood pressure is well controlled. He avoids salt for the most part.   6. Atrial fibrillation -   has persistent atrial fibrillation.    7. History of stroke   Mertie Moores, MD  09/24/2019 10:37 AM    [facility] [address]  [place], [place]  [address] Pager [phone] Phone: [phone]; Fax: [phone]

## 2019-10-01 ENCOUNTER — Other Ambulatory Visit: Payer: Self-pay | Admitting: Cardiovascular Disease

## 2019-10-14 ENCOUNTER — Other Ambulatory Visit: Payer: Self-pay | Admitting: Cardiovascular Disease

## 2019-10-14 NOTE — Telephone Encounter (Signed)
Pt last saw Dr Acie Fredrickson 09/24/19, last labs 05/31/19 Creat 1.27 at [place], age 84, weight 78.7kg, based on specified criteria pt is on appropriate dosage of Eliquis 5mg  BID.  Will refill rx.

## 2019-11-05 DIAGNOSIS — Z7984 Long term (current) use of oral hypoglycemic drugs: Secondary | ICD-10-CM | POA: Diagnosis not present

## 2019-11-05 DIAGNOSIS — E119 Type 2 diabetes mellitus without complications: Secondary | ICD-10-CM | POA: Diagnosis not present

## 2019-11-08 ENCOUNTER — Other Ambulatory Visit: Payer: Self-pay | Admitting: Legal Medicine

## 2019-11-09 ENCOUNTER — Encounter: Payer: Self-pay | Admitting: Legal Medicine

## 2019-12-20 ENCOUNTER — Encounter: Payer: Self-pay | Admitting: Legal Medicine

## 2019-12-20 ENCOUNTER — Ambulatory Visit (INDEPENDENT_AMBULATORY_CARE_PROVIDER_SITE_OTHER): Payer: Medicare Other | Admitting: Legal Medicine

## 2019-12-20 ENCOUNTER — Other Ambulatory Visit: Payer: Self-pay

## 2019-12-20 VITALS — BP 128/74 | HR 68 | Temp 97.4°F | Resp 16 | Ht 66.0 in | Wt 174.0 lb

## 2019-12-20 DIAGNOSIS — I2510 Atherosclerotic heart disease of native coronary artery without angina pectoris: Secondary | ICD-10-CM

## 2019-12-20 DIAGNOSIS — I499 Cardiac arrhythmia, unspecified: Secondary | ICD-10-CM | POA: Insufficient documentation

## 2019-12-20 DIAGNOSIS — I482 Chronic atrial fibrillation, unspecified: Secondary | ICD-10-CM

## 2019-12-20 DIAGNOSIS — E782 Mixed hyperlipidemia: Secondary | ICD-10-CM

## 2019-12-20 DIAGNOSIS — I4891 Unspecified atrial fibrillation: Secondary | ICD-10-CM | POA: Insufficient documentation

## 2019-12-20 DIAGNOSIS — E1140 Type 2 diabetes mellitus with diabetic neuropathy, unspecified: Secondary | ICD-10-CM | POA: Diagnosis not present

## 2019-12-20 DIAGNOSIS — R351 Nocturia: Secondary | ICD-10-CM | POA: Diagnosis not present

## 2019-12-20 DIAGNOSIS — N401 Enlarged prostate with lower urinary tract symptoms: Secondary | ICD-10-CM | POA: Diagnosis not present

## 2019-12-20 DIAGNOSIS — I10 Essential (primary) hypertension: Secondary | ICD-10-CM | POA: Diagnosis not present

## 2019-12-20 DIAGNOSIS — J449 Chronic obstructive pulmonary disease, unspecified: Secondary | ICD-10-CM | POA: Diagnosis not present

## 2019-12-20 NOTE — Progress Notes (Signed)
\   Subjective:  Patient ID: Gabriel Gutierrez, male    DOB: 04/05/1936  Age: 84 y.o. MRN: [id number]  Chief Complaint  Patient presents with  . Hypertension  . Atrial Fibrillation  . Diabetes    HPI: chronic visit.  Patient present with type 2 diabetes.  Specifically, this is type 2, noninsulin requiring diabetes, complicated by neuropathy.  Compliance with treatment has been good; patient take medicines as directed, maintains diet and exercise regimen, follows up as directed, and is keeping glucose diary.  Date of  diagnosis 2010.  Depression screen has been performed.Tobacco screen nonsmoker. Current medicines for diabetes metformin.  Patient is on none for renal protection and atorvastatin for cholesterol control.  Patient performs foot exams daily and last ophthalmologic exam was one month ago  Patient presents for follow up of hypertension.  Patient tolerating metoprolol and eliquis well with side effects.  Patient was diagnosed with hypertension 2010 so has been treated for hypertension for 10 years.Patient is working on maintaining diet and exercise regimen and follows up as directed. Complication include none  Atrial fibrillation sytable..   Current Outpatient Medications on File Prior to Visit  Medication Sig Dispense Refill  . atorvastatin (LIPITOR) 80 MG tablet TAKE 1 TABLET DAILY (PLEASE MAKE OVERDUE APPOINTMENT BEFORE ANYMORE REFILLS [phone]) 90 tablet 3  . diltiazem (CARDIZEM CD) 120 MG 24 hr capsule TAKE 1 CAPSULE DAILY 90 capsule 3  . ELIQUIS 5 MG TABS tablet TAKE 1 TABLET TWICE A DAY 180 tablet 1  . metFORMIN (GLUCOPHAGE) 500 MG tablet Take 500 mg by mouth 2 (two) times daily with a meal.      . Methylfol-Methylcob-Acetylcyst (L-METHYL-MC NAC) 6-2-600 MG TABS TAKE 1 TABLET BY MOUTH EVERY MORNING 90 tablet 3  . metoprolol tartrate (LOPRESSOR) 25 MG tablet Take 1 tablet (25 mg total) by mouth daily. 30 tablet 0  . tamsulosin (FLOMAX) 0.4 MG CAPS capsule Take 0.4 mg by  mouth daily.      No current facility-administered medications on file prior to visit.   Past Medical History:  Diagnosis Date  . AAA (abdominal aortic aneurysm) ([place])   . Arthritis   . Atrial fibrillation ([place])   . COPD (chronic obstructive pulmonary disease) ([place])   . Coronary artery disease    post CABG x5 --Severe three-vessel coronary disease  . Dysrhythmia   . Hyperlipidemia   . Hypertension   . Irregular heart beat  Feb 18, 2012  . Renal artery stenosis ([place])   . Stroke [facility]) Oct. 1, 2013   Mini stroke- Afib   Past Surgical History:  Procedure Laterality Date  . cabgx5    . CARDIAC CATHETERIZATION  08/26/2005   Est. EF of 65-70% -- Severe three-vessel coronary artery disease -- very heavily calcified vessels and really there is no role for angioplasty  -- he heavily calcified vessels and really there is no role for angioplasty abdominal aortic aneurysm surgery  . CARDIOVERSION  04/15/2012   Procedure: CARDIOVERSION;  Surgeon: Thayer Headings, MD;  Location: [facility];  Service: Cardiovascular;  Laterality: N/A;  . CATARACT EXTRACTION     left eye  . COLON SURGERY     ruptured colon  . CORONARY ARTERY BYPASS GRAFT  09/02/2005   x 5 -- left internal mammary artery graft to the left anterior descending coronary artery, with a saphenous vein graft to the diagonal branch of the LAD, a sequential saphenous vein graft to the second and fourth obtuse marginal branches of  the left circumflex coronary artery, and a saphenous vein graft to the posterior descending branch of RCA  -- Endoscopic vein harvesting from the left leg  . EYE SURGERY Left    Cataract  . EYE SURGERY Right    Cataract  . HAND SURGERY  1997  . LAPAROTOMY  1998  . MEDIAN STERNOTOMY    . PARTIAL KNEE ARTHROPLASTY Left 07/17/2015   Procedure: UNICOMPARTMENTAL KNEE;  Surgeon: Vickey Huger, MD;  Location: [facility];  Service: Orthopedics;  Laterality: Left;  . PR VEIN BYPASS GRAFT,AORTO-FEM-POP  12/23/2005    Infrarenal abdominal aortic aneurysm, right common iliac occlusive disease --   . SKIN GRAFT  1992    Family History  Problem Relation Age of Onset  . Coronary artery disease Mother   . Heart disease Mother        After age 63-Carotid  . Hyperlipidemia Mother   . Stroke Mother   . Coronary artery disease Father   . Hyperlipidemia Father   . Hypertension Father   . Varicose Veins Father   . Heart attack Father   . Coronary artery disease Sister   . Heart disease Sister        After age 56- BP  . Heart attack Sister   . Diabetes Brother   . Heart disease Brother        After 7 yrs of age  . Heart attack Brother   . Hyperlipidemia Brother   . Alzheimer's disease Sister   . Seizures Sister    Social History   Socioeconomic History  . Marital status: Married    Spouse name: Not on file  . Number of children: Not on file  . Years of education: Not on file  . Highest education level: Not on file  Occupational History  . Not on file  Tobacco Use  . Smoking status: Former Smoker    Packs/day: 2.00    Years: 44.00    Pack years: 88.00    Types: Cigarettes    Quit date: 05/20/1988    Years since quitting: 31.6  . Smokeless tobacco: Former Systems developer    Quit date: 02/11/2003  Substance and Sexual Activity  . Alcohol use: No    Alcohol/week: 0.0 standard drinks  . Drug use: No  . Sexual activity: Never  Other Topics Concern  . Not on file  Social History Narrative  . Not on file   Social Determinants of Health   Financial Resource Strain:   . Difficulty of Paying Living Expenses:   Food Insecurity:   . Worried About Charity fundraiser in the Last Year:   . Arboriculturist in the Last Year:   Transportation Needs:   . Film/video editor (Medical):   [name] Lack of Transportation (Non-Medical):   Physical Activity:   . Days of Exercise per Week:   . Minutes of Exercise per Session:   Stress:   . Feeling of Stress :   Social Connections:   . Frequency of Communication  with Friends and Family:   . Frequency of Social Gatherings with Friends and Family:   . Attends Religious Services:   . Active Member of Clubs or Organizations:   . Attends Archivist Meetings:   [name] Marital Status:     Review of Systems  Constitutional: Negative.   HENT: Negative.   Eyes: Negative.   Respiratory: Negative.   Cardiovascular: Negative.   Gastrointestinal: Negative.   Endocrine: Negative.   Genitourinary:  Negative.   Musculoskeletal: Negative.   Skin: Negative.   Neurological: Positive for numbness.  Psychiatric/Behavioral: Negative.      Objective:  BP 128/74   Pulse 68   Temp (!) 97.4 F (36.3 C)   Resp 16   Ht 5\' 6"  (1.676 m)   Wt 174 lb (78.9 kg)   SpO2 97%   BMI 28.08 kg/m   BP/Weight 12/20/2019 09/24/2019 2/84/1324  Systolic BP 401 027 253  Diastolic BP 74 62 68  Wt. (Lbs) 174 173.5 172  BMI 28.08 28 27.76    Physical Exam Vitals reviewed.  Constitutional:      Appearance: Normal appearance.  HENT:     Head: Normocephalic and atraumatic.     Right Ear: Tympanic membrane, ear canal and external ear normal.     Left Ear: Tympanic membrane, ear canal and external ear normal.     Nose: Nose normal.     Mouth/Throat:     Mouth: Mucous membranes are moist.  Eyes:     Extraocular Movements: Extraocular movements intact.     Conjunctiva/sclera: Conjunctivae normal.     Pupils: Pupils are equal, round, and reactive to light.  Cardiovascular:     Rate and Rhythm: Normal rate and regular rhythm.     Pulses: Normal pulses.     Heart sounds: Normal heart sounds.  Pulmonary:     Effort: Pulmonary effort is normal.     Breath sounds: Normal breath sounds.  Abdominal:     General: Abdomen is flat.     Palpations: Abdomen is soft.  Musculoskeletal:        General: Normal range of motion.     Cervical back: Normal range of motion and neck supple.  Skin:    General: Skin is warm and dry.     Capillary Refill: Capillary refill takes less  than 2 seconds.  Neurological:     General: No focal deficit present.     Mental Status: He is alert. Mental status is at baseline.  Psychiatric:        Mood and Affect: Mood normal.        Thought Content: Thought content normal.        Judgment: Judgment normal.     Diabetic Foot Exam - Simple   Simple Foot Form Diabetic Foot exam was performed with the following findings: Yes 12/20/2019 10:26 AM  Visual Inspection See comments: Yes Sensation Testing See comments: Yes Pulse Check Posterior Tibialis and Dorsalis pulse intact bilaterally: Yes Comments Bunions and hammer toes, decreased monofilament sensation      Lab Results  Component Value Date   WBC 12.7 (H) 07/18/2017   HGB 15.5 07/18/2017   HCT 45.1 07/18/2017   PLT 311 07/18/2017   GLUCOSE 103 (H) 07/18/2017   CHOL 142 07/18/2017   TRIG 121 07/18/2017   HDL 56 07/18/2017   LDLCALC 62 07/18/2017   ALT 13 07/18/2017   AST 23 07/18/2017   NA 142 07/18/2017   K 5.1 07/18/2017   CL 103 07/18/2017   CREATININE 1.14 07/18/2017   BUN 19 07/18/2017   CO2 24 07/18/2017   TSH 2.920 02/02/2014   INR 1.10 07/17/2015   HGBA1C 6.3 (H) 07/17/2015      Assessment & Plan:   1. Type 2 diabetes mellitus with diabetic neuropathy, unspecified whether long term insulin use (HCC) - Lipid panel - Hemoglobin A1c An individual care plan for diabetes was established and reinforced today.  The patient's status was  assessed using clinical findings on exam, labs and diagnostic testing. Patient success at meeting goals based on disease specific evidence-based guidelines and found to be good controlled. Medications were assessed and patient's understanding of the medical issues , including barriers were assessed. Recommend adherence to a diabetic diet, a graduated exercise program, HgbA1c level is checked quarterly, and urine microalbumin performed yearly .  Annual mono-filament sensation testing performed. Lower blood pressure and  control hyperlipidemia is important. Get annual eye exams and annual flu shots and smoking cessation discussed.  Self management goals were discussed.  2. Essential hypertension - CBC with Differential/Platelet - Comprehensive metabolic panel An individual hypertension care plan was established and reinforced today.  The patient's status was assessed using clinical findings on exam and labs or diagnostic tests. The patient's success at meeting treatment goals on disease specific evidence-based guidelines and found to be well controlled. SELF MANAGEMENT: The patient and I together assessed ways to personally work towards obtaining the recommended goals. RECOMMENDATIONS: avoid decongestants found in common cold remedies, decrease consumption of alcohol, perform routine monitoring of BP with home BP cuff, exercise, reduction of dietary salt, take medicines as prescribed, try not to miss doses and quit smoking.  Regular exercise and maintaining a healthy weight is needed.  Stress reduction may help. A CLINICAL SUMMARY including written plan identify barriers to care unique to individual due to social or financial issues.  We attempt to mutually creat solutions for individual and family understanding.  3. Chronic atrial fibrillation (HCC) AN INDIVIDUAL CARE PLAN for atrial fibrillation was established and reinforced today.  The patient's status was assessed using clinical findings on exam, labs, and other diagnostic testing. Patient's success at meeting treatment goals based on disease specific evidence-bassed guidelines and found to be in good control. RECOMMENDATIONS include maintaining present medicines and treatment.  4. Chronic obstructive pulmonary disease, unspecified COPD type ([place]) An individualize plan was formulated for care of COPD.  Treatment is evidence based.  She will continue on inhalers, avoid smoking and smoke.  Regular exercise with help with dyspnea. Routine follow ups and medication  compliance is needed.  5. Benign prostatic hyperplasia with nocturia - PSA AN INDIVIDUAL CARE PLAN for BPH was established and reinforced today.  The patient's status was assessed using clinical findings on exam, labs, and other diagnostic testing. Patient's success at meeting treatment goals based on disease specific evidence-bassed guidelines and found to be in good control. RECOMMENDATIONS include maintaining present medicines and treatment.  6. Coronary artery disease involving native coronary artery of native heart without angina pectoris Patient's CAD was assessed using history and physical along with other information to maximize treatment.  Evidence based criteria was use in deciding proper management for this disease process.  Patient's CAD is under good control.therapy continue present medicines.  7. Mixed hyperlipidemia AN INDIVIDUAL CARE PLAN for hyperlipidemia/ cholesterol was established and reinforced today.  The patient's status was assessed using clinical findings on exam, lab and other diagnostic tests. The patient's disease status was assessed based on evidence-based guidelines and found to be well controlled. MEDICATIONS were reviewed. SELF MANAGEMENT GOALS have been discussed and patient's success at attaining the goal of low cholesterol was assessed. RECOMMENDATION given include regular exercise 3 days a week and low cholesterol/low fat diet. CLINICAL SUMMARY including written plan to identify barriers unique to the patient due to social or economic  reasons was discussed.    No orders of the defined types were placed in this encounter.   Orders  Placed This Encounter  Procedures  . CBC with Differential/Platelet  . Comprehensive metabolic panel  . Lipid panel  . Hemoglobin A1c  . PSA     Follow-up: Return in about 4 months (around 04/20/2020) for fasting, .  An After Visit Summary was printed and given to the patient.  [place] [phone]

## 2019-12-21 LAB — COMPREHENSIVE METABOLIC PANEL
ALT: 20 IU/L (ref 0–44)
AST: 30 IU/L (ref 0–40)
Albumin/Globulin Ratio: 2.7 — ABNORMAL HIGH (ref 1.2–2.2)
Albumin: 4.8 g/dL — ABNORMAL HIGH (ref 3.6–4.6)
Alkaline Phosphatase: 109 IU/L (ref 48–121)
BUN/Creatinine Ratio: 14 (ref 10–24)
BUN: 19 mg/dL (ref 8–27)
Bilirubin Total: 0.5 mg/dL (ref 0.0–1.2)
CO2: 24 mmol/L (ref 20–29)
Calcium: 9.8 mg/dL (ref 8.6–10.2)
Chloride: 101 mmol/L (ref 96–106)
Creatinine, Ser: 1.33 mg/dL — ABNORMAL HIGH (ref 0.76–1.27)
GFR calc Af Amer: 58 mL/min/{1.73_m2} — ABNORMAL LOW (ref >59)
GFR calc non Af Amer: 50 mL/min/{1.73_m2} — ABNORMAL LOW (ref >59)
Globulin, Total: 1.8 g/dL (ref 1.5–4.5)
Glucose: 123 mg/dL — ABNORMAL HIGH (ref 65–99)
Potassium: 5.8 mmol/L — ABNORMAL HIGH (ref 3.5–5.2)
Sodium: 140 mmol/L (ref 134–144)
Total Protein: 6.6 g/dL (ref 6.0–8.5)

## 2019-12-21 LAB — PSA: Prostate Specific Ag, Serum: 0.8 ng/mL (ref 0.0–4.0)

## 2019-12-21 LAB — CBC WITH DIFFERENTIAL/PLATELET
Basophils Absolute: 0 10*3/uL (ref 0.0–0.2)
Basos: 0 %
EOS (ABSOLUTE): 0.1 10*3/uL (ref 0.0–0.4)
Eos: 2 %
Hematocrit: 44.5 % (ref 37.5–51.0)
Hemoglobin: 14.9 g/dL (ref 13.0–17.7)
Immature Grans (Abs): 0 10*3/uL (ref 0.0–0.1)
Immature Granulocytes: 0 %
Lymphocytes Absolute: 1.1 10*3/uL (ref 0.7–3.1)
Lymphs: 11 %
MCH: 30.1 pg (ref 26.6–33.0)
MCHC: 33.5 g/dL (ref 31.5–35.7)
MCV: 90 fL (ref 79–97)
Monocytes Absolute: 0.9 10*3/uL (ref 0.1–0.9)
Monocytes: 9 %
Neutrophils Absolute: 7.4 10*3/uL — ABNORMAL HIGH (ref 1.4–7.0)
Neutrophils: 78 %
Platelets: 254 10*3/uL (ref 150–450)
RBC: 4.95 x10E6/uL (ref 4.14–5.80)
RDW: 12.5 % (ref 11.6–15.4)
WBC: 9.5 10*3/uL (ref 3.4–10.8)

## 2019-12-21 LAB — LIPID PANEL
Chol/HDL Ratio: 3 ratio (ref 0.0–5.0)
Cholesterol, Total: 135 mg/dL (ref 100–199)
HDL: 45 mg/dL (ref >39)
LDL Chol Calc (NIH): 69 mg/dL (ref 0–99)
Triglycerides: 118 mg/dL (ref 0–149)
VLDL Cholesterol Cal: 21 mg/dL (ref 5–40)

## 2019-12-21 LAB — HEMOGLOBIN A1C
Est. average glucose Bld gHb Est-mCnc: 131 mg/dL
Hgb A1c MFr Bld: 6.2 % — ABNORMAL HIGH (ref 4.8–5.6)

## 2019-12-21 LAB — CARDIOVASCULAR RISK ASSESSMENT

## 2019-12-21 NOTE — Progress Notes (Signed)
CBC ok, glucose 123, kidney tests stable, potassium 5.8 high recheck one week, Cholesterol normal, A1c 6.2 stable, PSA 0.8 normal lp

## 2019-12-22 ENCOUNTER — Other Ambulatory Visit: Payer: Self-pay | Admitting: Legal Medicine

## 2019-12-22 ENCOUNTER — Other Ambulatory Visit: Payer: Self-pay

## 2019-12-22 DIAGNOSIS — I10 Essential (primary) hypertension: Secondary | ICD-10-CM

## 2019-12-22 DIAGNOSIS — E875 Hyperkalemia: Secondary | ICD-10-CM

## 2019-12-31 ENCOUNTER — Other Ambulatory Visit: Payer: Self-pay

## 2019-12-31 ENCOUNTER — Other Ambulatory Visit: Payer: Medicare Other

## 2019-12-31 DIAGNOSIS — E875 Hyperkalemia: Secondary | ICD-10-CM

## 2019-12-31 LAB — COMPREHENSIVE METABOLIC PANEL
ALT: 15 IU/L (ref 0–44)
AST: 21 IU/L (ref 0–40)
Albumin/Globulin Ratio: 2.7 — ABNORMAL HIGH (ref 1.2–2.2)
Albumin: 4.6 g/dL (ref 3.6–4.6)
Alkaline Phosphatase: 113 IU/L (ref 48–121)
BUN/Creatinine Ratio: 15 (ref 10–24)
BUN: 19 mg/dL (ref 8–27)
Bilirubin Total: 0.4 mg/dL (ref 0.0–1.2)
CO2: 23 mmol/L (ref 20–29)
Calcium: 9.5 mg/dL (ref 8.6–10.2)
Chloride: 108 mmol/L — ABNORMAL HIGH (ref 96–106)
Creatinine, Ser: 1.28 mg/dL — ABNORMAL HIGH (ref 0.76–1.27)
GFR calc Af Amer: 60 mL/min/{1.73_m2} (ref >59)
GFR calc non Af Amer: 52 mL/min/{1.73_m2} — ABNORMAL LOW (ref >59)
Globulin, Total: 1.7 g/dL (ref 1.5–4.5)
Glucose: 137 mg/dL — ABNORMAL HIGH (ref 65–99)
Potassium: 5.6 mmol/L — ABNORMAL HIGH (ref 3.5–5.2)
Sodium: 146 mmol/L — ABNORMAL HIGH (ref 134–144)
Total Protein: 6.3 g/dL (ref 6.0–8.5)

## 2020-01-02 NOTE — Progress Notes (Signed)
Glucose 137, kidney tets stable, potassium still up chronic,  lp

## 2020-01-13 ENCOUNTER — Encounter: Payer: Self-pay | Admitting: Adult Health

## 2020-02-08 ENCOUNTER — Ambulatory Visit: Payer: Self-pay | Admitting: Adult Health

## 2020-02-08 ENCOUNTER — Other Ambulatory Visit: Payer: Self-pay | Admitting: Legal Medicine

## 2020-02-19 DIAGNOSIS — J449 Chronic obstructive pulmonary disease, unspecified: Secondary | ICD-10-CM | POA: Diagnosis not present

## 2020-02-19 DIAGNOSIS — Z20822 Contact with and (suspected) exposure to covid-19: Secondary | ICD-10-CM | POA: Diagnosis not present

## 2020-02-19 DIAGNOSIS — R059 Cough, unspecified: Secondary | ICD-10-CM | POA: Diagnosis not present

## 2020-02-23 DIAGNOSIS — J449 Chronic obstructive pulmonary disease, unspecified: Secondary | ICD-10-CM | POA: Diagnosis not present

## 2020-02-23 DIAGNOSIS — E663 Overweight: Secondary | ICD-10-CM | POA: Diagnosis not present

## 2020-02-23 DIAGNOSIS — I4819 Other persistent atrial fibrillation: Secondary | ICD-10-CM | POA: Diagnosis not present

## 2020-02-23 DIAGNOSIS — E119 Type 2 diabetes mellitus without complications: Secondary | ICD-10-CM | POA: Diagnosis not present

## 2020-02-23 DIAGNOSIS — Z6827 Body mass index (BMI) 27.0-27.9, adult: Secondary | ICD-10-CM | POA: Diagnosis not present

## 2020-02-23 DIAGNOSIS — Z23 Encounter for immunization: Secondary | ICD-10-CM | POA: Diagnosis not present

## 2020-02-24 ENCOUNTER — Other Ambulatory Visit: Payer: Self-pay | Admitting: Neurology

## 2020-02-26 ENCOUNTER — Other Ambulatory Visit: Payer: Self-pay | Admitting: Legal Medicine

## 2020-02-28 DIAGNOSIS — J449 Chronic obstructive pulmonary disease, unspecified: Secondary | ICD-10-CM | POA: Diagnosis not present

## 2020-02-28 DIAGNOSIS — R058 Other specified cough: Secondary | ICD-10-CM | POA: Diagnosis not present

## 2020-02-28 DIAGNOSIS — Z23 Encounter for immunization: Secondary | ICD-10-CM | POA: Diagnosis not present

## 2020-03-06 ENCOUNTER — Ambulatory Visit: Payer: Medicare Other | Admitting: Adult Health

## 2020-03-15 ENCOUNTER — Ambulatory Visit (INDEPENDENT_AMBULATORY_CARE_PROVIDER_SITE_OTHER): Payer: Medicare Other | Admitting: Adult Health

## 2020-03-15 ENCOUNTER — Encounter: Payer: Self-pay | Admitting: Adult Health

## 2020-03-15 VITALS — BP 138/88 | HR 96 | Ht 66.0 in | Wt 173.0 lb

## 2020-03-15 DIAGNOSIS — E785 Hyperlipidemia, unspecified: Secondary | ICD-10-CM | POA: Diagnosis not present

## 2020-03-15 DIAGNOSIS — G3184 Mild cognitive impairment, so stated: Secondary | ICD-10-CM | POA: Diagnosis not present

## 2020-03-15 DIAGNOSIS — I10 Essential (primary) hypertension: Secondary | ICD-10-CM

## 2020-03-15 DIAGNOSIS — I4821 Permanent atrial fibrillation: Secondary | ICD-10-CM | POA: Diagnosis not present

## 2020-03-15 DIAGNOSIS — Z8673 Personal history of transient ischemic attack (TIA), and cerebral infarction without residual deficits: Secondary | ICD-10-CM | POA: Diagnosis not present

## 2020-03-15 NOTE — Patient Instructions (Addendum)
Sit here to talk all day long but I think everything is looking good continue doing what you are doing continue with those memory exercises and I think at this point we can probably do a follow-up in a year and recheck everything make sure things going okay stable but if anything should change anything worsens during that time we can always have you come back are for you.  He has yet goal.  Goal for nerve involvement will require an MRI negative letter D1 of your present from today  Your Cologuard "."  Heart::.  She has been getting  For today's visit.  Genitourinary: 1 probable yes you just let you know to resolve by next scheduled visit.  Okay throat. In all the other between continue Eliquis (apixaban) daily  and atorvastatin  for secondary stroke prevention  Continue to follow up with PCP regarding cholesterol and blood pressure management  Maintain strict control of hypertension with blood pressure goal below 130/90 and cholesterol with LDL cholesterol (bad cholesterol) goal below 70 mg/dL.     Followup in the future with me in 1 year or call earlier if needed     Thank you for coming to see [place] at [facility]. I hope we have been able to provide you high quality care today.  You may receive a patient satisfaction survey over the next few weeks. We would appreciate your feedback and comments so that we may continue to improve ourselves and the health of our patients.

## 2020-03-15 NOTE — Progress Notes (Signed)
[facility] [address] [place]. [place] [address] [phone]       OFFICE FOLLOW UP VISIT NOTE  [name] Date of Birth:  Aug 08, 1935 Medical Record Number:  [id number]   PCP: Julian Reil, MD   CC: f/u stroke and cognitive impairment   HPI:   Gabriel Gutierrez is a 84 y.o. male with PMHx of AAA, atrial fibrillation on Eliquis, CAD s/p CABGx5, HLD, HTN, renal artery stenosis, right parietal stroke 02/2012, TIA 01/2014, MCI and R BG/IC stroke (around 03/2019 as found on imaging after episode of vertigo).  He has been routinely followed in this office since 2015 with routine monitoring of cognition.  Today, 03/15/2020, Gabriel Gutierrez returns for 78-month follow-up after prior visit 08/02/2018.  He is accompanied by his son.  He has been stable since prior visit without new or recurrent stroke/TIA symptoms.  Remains on Eliquis 5 mg twice daily and atorvastatin 80 mg daily for secondary stroke prevention without side effects.  Recent lipid panel showed LDL 69.  Blood pressure today 138/88.  Cognition has been stable with MMSE today 24/30 (previously 23/30).  He continues to maintain ADLs and majority of IADLs independently.  He continues to do memory exercises daily as well as keeping active and routine exercise.  No concerns at this time.    ROS:   14 system review of systems is positive for no complaints and all other systems negative  PMH:  Past Medical History:  Diagnosis Date  . AAA (abdominal aortic aneurysm) ([place])   . Arthritis   . Atrial fibrillation ([place])   . COPD (chronic obstructive pulmonary disease) ([place])   . Coronary artery disease    post CABG x5 --Severe three-vessel coronary disease  . Dysrhythmia   . Hyperlipidemia   . Hypertension   . Irregular heart beat  Feb 18, 2012  . Renal artery stenosis ([place])   . Stroke [facility]) Oct. 1, 2013   Mini stroke- Afib    Social History:  Social History   Socioeconomic History  . Marital  status: Married    Spouse name: Not on file  . Number of children: Not on file  . Years of education: Not on file  . Highest education level: Not on file  Occupational History  . Not on file  Tobacco Use  . Smoking status: Former Smoker    Packs/day: 2.00    Years: 44.00    Pack years: 88.00    Types: Cigarettes    Quit date: 05/20/1988    Years since quitting: 31.8  . Smokeless tobacco: Former Systems developer    Quit date: 02/11/2003  Substance and Sexual Activity  . Alcohol use: No    Alcohol/week: 0.0 standard drinks  . Drug use: No  . Sexual activity: Never  Other Topics Concern  . Not on file  Social History Narrative  . Not on file   Social Determinants of Health   Financial Resource Strain:   . Difficulty of Paying Living Expenses: Not on file  Food Insecurity:   . Worried About Charity fundraiser in the Last Year: Not on file  . Ran Out of Food in the Last Year: Not on file  Transportation Needs:   . Lack of Transportation (Medical): Not on file  . Lack of Transportation (Non-Medical): Not on file  Physical Activity:   . Days of Exercise per Week: Not on file  . Minutes of Exercise per Session: Not on file  Stress:   . Feeling of Stress : Not on file  Social Connections:   . Frequency of Communication with Friends and Family: Not on file  . Frequency of Social Gatherings with Friends and Family: Not on file  . Attends Religious Services: Not on file  . Active Member of Clubs or Organizations: Not on file  . Attends Archivist Meetings: Not on file  . Marital Status: Not on file  Intimate Partner Violence:   . Fear of Current or Ex-Partner: Not on file  . Emotionally Abused: Not on file  . Physically Abused: Not on file  . Sexually Abused: Not on file    Medications:   Current Outpatient Medications on File Prior to Visit  Medication Sig Dispense Refill  . albuterol (PROVENTIL) (2.5 MG/3ML) 0.083% nebulizer solution Inhale into the lungs.    [name]  atorvastatin (LIPITOR) 80 MG tablet TAKE 1 TABLET DAILY (PLEASE MAKE OVERDUE APPOINTMENT BEFORE ANYMORE REFILLS [phone]) 90 tablet 3  . diltiazem (CARDIZEM CD) 120 MG 24 hr capsule TAKE 1 CAPSULE DAILY 90 capsule 2  . ELIQUIS 5 MG TABS tablet TAKE 1 TABLET TWICE A DAY 180 tablet 1  . metFORMIN (GLUCOPHAGE) 500 MG tablet TAKE 1 TABLET TWICE A DAY 180 tablet 2  . Methylfol-Methylcob-Acetylcyst (L-METHYL-MC NAC) 6-2-600 MG TABS TAKE 1 TABLET BY MOUTH EVERY MORNING 90 tablet 3  . metoprolol tartrate (LOPRESSOR) 25 MG tablet TAKE 1 TABLET DAILY 90 tablet 3  . tamsulosin (FLOMAX) 0.4 MG CAPS capsule Take 0.4 mg by mouth daily.      No current facility-administered medications on file prior to visit.    Allergies:  No Known Allergies  Physical Exam  Today's Vitals   03/15/20 1100  BP: 138/88  Pulse: 96  Weight: 173 lb (78.5 kg)  Height: 5\' 6"  (1.676 m)   No data found.  Body mass index is 27.92 kg/m.  General: well developed, well nourished very pleasant elderly male, seated, in no evident distress Head: head normocephalic and atraumatic.  Neck: supple with no carotid or supraclavicular bruits Cardiovascular: irregular rate and rhythm, no murmurs Musculoskeletal: no deformity Skin:  no rash/petichiae Vascular:  Normal pulses all extremities  Neurologic Exam Mental Status: Awake and fully alert. Oriented to place and time. Recent memory impaired and remote memory intact. Attention span, concentration and fund of knowledge appropriate during visit. Mood and affect appropriate.  MMSE - Mini Mental State Exam 03/15/2020 08/02/2019 01/26/2018  Orientation to time 3 4 5   Orientation to Place 5 4 5   Registration 3 3 3   Attention/ Calculation 1 0 4  Recall 3 3 3   Language- name 2 objects 2 2 2   Language- repeat 1 1 1   Language- follow 3 step command 3 3 3   Language- read & follow direction 1 1 1   Write a sentence 1 1 1   Copy design 1 1 1   Copy design-comments 15 animals - -   Total score 24 23 29    Cranial Nerves: Pupils equal, briskly reactive to light. Extraocular movements full without nystagmus. Visual fields full to confrontation. Hearing diminished bilaterally. Facial sensation intact. Face, tongue, palate moves normally and symmetrically.  Motor: Normal bulk and tone. Normal strength in all tested extremity muscles. Sensory.: intact to touch and pinprick and vibratory sensation.  Coordination: Rapid alternating movements normal in all extremities. Finger-to-nose and heel-to-shin performed accurately bilaterally. Gait and Station: Arises from chair without difficulty. Stance is normal. Gait demonstrates normal stride length and balance without assistive  device. Reflexes: 1+ and symmetric. Toes downgoing.     ASSESSMENT/PLAN: 84 year male with PMHx of right MCA stroke 02/2012, atrial fibrillation on Eliquis, possible TIAs, HTN, HLD, CAD s/p CABG and cognitive impairment for which he is routinely followed in this office.  During visit on 04/26/2019, patient reported transient episode of vertigo a week prior accompanied by episode of nausea and vomiting therefore imaging obtained which showed right basal ganglia/internal capsule infarct new from prior imaging likely secondary to small vessel disease.    -Continue Eliquis and atorvastatin for secondary stroke prevention -Discussed secondary stroke prevention measures and importance of close PCP follow-up for aggressive stroke risk factor management with maintaining HTN with BP goal <130/90 and HLD with LDL goal<70  -Continue to follow with cardiology for atrial fibrillation and Eliquis management -Cognition stable with MMSE 24/30 -encourage continued memory exercises as well as managing stroke risk factors, maintaining a healthy diet, adequate exercise and adequate sleep -Previously referred to [facility] evaluated by Dr. Rexene Alberts on 05/06/2019 for possible sleep apnea who recommended pursuing sleep study but has  not been completed at this time.  Further discussed importance of testing and importance of sleep apnea treatment if indicated in setting of prior strokes, atrial fibrillation and cardiovascular risk factors.  He was advised to call office if he wishes to proceed with testing in the future    Follow-up in 1 year or call earlier if needed  CC:  [place] provider: Dr. Roswell Miners, Catalina Lunger, MD    I spent 30 minutes of face-to-face and non-face-to-face time with patient and son.  This included previsit chart review, lab review, study review, order entry, electronic health record documentation, patient education and discussion regarding history of strokes, importance of managing stroke risk factors, review of MMSE and cognition and answered all questions to patient and son satisfaction   Frann Rider, AGNP-BC  [facility] [address] [place], [place] [address]  Phone [phone] Fax [phone] Note: This document was prepared with digital dictation and possible smart phrase technology. Any transcriptional errors that result from this process are unintentional.

## 2020-03-15 NOTE — Progress Notes (Signed)
I agree with the above plan 

## 2020-04-11 ENCOUNTER — Other Ambulatory Visit: Payer: Self-pay | Admitting: Cardiovascular Disease

## 2020-04-11 NOTE — Telephone Encounter (Signed)
Pt last saw Dr Acie Fredrickson 09/24/19, last labs 12/20/19 Creat 1.33, age 84, weight 78.5kg, based on specified criteria pt is on appropriate dosage of Eliquis 5mg  BID.  Will refill rx.

## 2020-04-20 ENCOUNTER — Ambulatory Visit: Payer: Medicare Other | Admitting: Legal Medicine

## 2020-04-20 ENCOUNTER — Encounter: Payer: Self-pay | Admitting: Legal Medicine

## 2020-04-22 DIAGNOSIS — Z20822 Contact with and (suspected) exposure to covid-19: Secondary | ICD-10-CM | POA: Diagnosis not present

## 2020-05-09 ENCOUNTER — Telehealth: Payer: Self-pay

## 2020-05-09 NOTE — Telephone Encounter (Signed)
lmomed the pt that we need him to call us back to better explain his msg he previously left on our answering machine.

## 2020-05-29 DIAGNOSIS — Z0000 Encounter for general adult medical examination without abnormal findings: Secondary | ICD-10-CM | POA: Diagnosis not present

## 2020-05-29 DIAGNOSIS — R159 Full incontinence of feces: Secondary | ICD-10-CM | POA: Diagnosis not present

## 2020-05-29 DIAGNOSIS — G4733 Obstructive sleep apnea (adult) (pediatric): Secondary | ICD-10-CM | POA: Diagnosis not present

## 2020-05-29 DIAGNOSIS — E119 Type 2 diabetes mellitus without complications: Secondary | ICD-10-CM | POA: Diagnosis not present

## 2020-05-29 DIAGNOSIS — Z6828 Body mass index (BMI) 28.0-28.9, adult: Secondary | ICD-10-CM | POA: Diagnosis not present

## 2020-05-29 DIAGNOSIS — Z7689 Persons encountering health services in other specified circumstances: Secondary | ICD-10-CM | POA: Diagnosis not present

## 2020-05-29 DIAGNOSIS — E663 Overweight: Secondary | ICD-10-CM | POA: Diagnosis not present

## 2020-07-04 DIAGNOSIS — R159 Full incontinence of feces: Secondary | ICD-10-CM | POA: Diagnosis not present

## 2020-07-26 ENCOUNTER — Other Ambulatory Visit: Payer: Self-pay | Admitting: *Deleted

## 2020-07-26 DIAGNOSIS — I714 Abdominal aortic aneurysm, without rupture, unspecified: Secondary | ICD-10-CM

## 2020-07-31 ENCOUNTER — Encounter: Payer: Self-pay | Admitting: Physician Assistant

## 2020-07-31 ENCOUNTER — Ambulatory Visit (INDEPENDENT_AMBULATORY_CARE_PROVIDER_SITE_OTHER): Payer: Medicare Other | Admitting: Physician Assistant

## 2020-07-31 ENCOUNTER — Other Ambulatory Visit: Payer: Self-pay

## 2020-07-31 ENCOUNTER — Ambulatory Visit (HOSPITAL_COMMUNITY)
Admission: RE | Admit: 2020-07-31 | Discharge: 2020-07-31 | Disposition: A | Discharge status: Home / Self Care | Payer: Medicare Other | Source: Ambulatory Visit | Attending: Surgery | Admitting: Surgery

## 2020-07-31 VITALS — BP 110/64 | HR 80 | Temp 98.5°F | Resp 20 | Ht 66.0 in | Wt 176.5 lb

## 2020-07-31 DIAGNOSIS — I714 Abdominal aortic aneurysm, without rupture, unspecified: Secondary | ICD-10-CM

## 2020-07-31 DIAGNOSIS — I6523 Occlusion and stenosis of bilateral carotid arteries: Secondary | ICD-10-CM

## 2020-07-31 NOTE — Progress Notes (Signed)
Office Note     CC:  follow up Requesting Provider:  Lillard Anes,*  HPI: Gabriel Gutierrez is a 85 y.o. (03/27/1936) male who presents for routine follow up of EVAR. His EVAR was performed by Dr. Kellie Simmering in 2007. He has done well post operatively. He was last seen in September of 2020, at which time he was doing very well. His duplex at the time showed stable aneurysm sac of 3 cm without any endoleak.   He recently fell outside on cement walkway onto his back. He has had some back discomfort since. He denies any abdominal pain or back pain. He continues to golf and ambulates without issues. He denies any claudication, weakness, numbness or rest pain  Carotid duplex at last visit showed 1-39% stenosis of the bilateral ICA. He is without any neurological symptoms  The pt is not on a statin for cholesterol management.  The pt is on a daily aspirin.   Other AC: Eliquis The pt is on CCB, BB for hypertension.   The pt is diabetic.  Tobacco hx: Former, quit 1990  Past Medical History:  Diagnosis Date  . AAA (abdominal aortic aneurysm) ([place])   . Arthritis   . Atrial fibrillation ([place])   . COPD (chronic obstructive pulmonary disease) ([place])   . Coronary artery disease    post CABG x5 --Severe three-vessel coronary disease  . Dysrhythmia   . Hyperlipidemia   . Hypertension   . Irregular heart beat  Feb 18, 2012  . Renal artery stenosis ([place])   . Stroke [facility]) Oct. 1, 2013   Mini stroke- Afib    Past Surgical History:  Procedure Laterality Date  . cabgx5    . CARDIAC CATHETERIZATION  08/26/2005   Est. EF of 65-70% -- Severe three-vessel coronary artery disease -- very heavily calcified vessels and really there is no role for angioplasty  -- he heavily calcified vessels and really there is no role for angioplasty abdominal aortic aneurysm surgery  . CARDIOVERSION  04/15/2012   Procedure: CARDIOVERSION;  Surgeon: Thayer Headings, MD;  Location: [place];  Service:  Cardiovascular;  Laterality: N/A;  . CATARACT EXTRACTION     left eye  . COLON SURGERY     ruptured colon  . CORONARY ARTERY BYPASS GRAFT  09/02/2005   x 5 -- left internal mammary artery graft to the left anterior descending coronary artery, with a saphenous vein graft to the diagonal branch of the LAD, a sequential saphenous vein graft to the second and fourth obtuse marginal branches of the left circumflex coronary artery, and a saphenous vein graft to the posterior descending branch of RCA  -- Endoscopic vein harvesting from the left leg  . EYE SURGERY Left    Cataract  . EYE SURGERY Right    Cataract  . HAND SURGERY  1997  . LAPAROTOMY  1998  . MEDIAN STERNOTOMY    . PARTIAL KNEE ARTHROPLASTY Left 07/17/2015   Procedure: UNICOMPARTMENTAL KNEE;  Surgeon: Vickey Huger, MD;  Location: [facility];  Service: Orthopedics;  Laterality: Left;  . PR VEIN BYPASS GRAFT,AORTO-FEM-POP  12/23/2005   Infrarenal abdominal aortic aneurysm, right common iliac occlusive disease --   . SKIN GRAFT  1992    Social History   Socioeconomic History  . Marital status: Married    Spouse name: Not on file  . Number of children: Not on file  . Years of education: Not on file  . Highest education level: Not on file  Occupational History  . Not on file  Tobacco Use  . Smoking status: Former Smoker    Packs/day: 2.00    Years: 44.00    Pack years: 88.00    Types: Cigarettes    Quit date: 05/20/1988    Years since quitting: 32.2  . Smokeless tobacco: Former Systems developer    Quit date: 02/11/2003  Substance and Sexual Activity  . Alcohol use: No    Alcohol/week: 0.0 standard drinks  . Drug use: No  . Sexual activity: Never  Other Topics Concern  . Not on file  Social History Narrative  . Not on file   Social Determinants of Health   Financial Resource Strain: Not on file  Food Insecurity: Not on file  Transportation Needs: Not on file  Physical Activity: Not on file  Stress: Not on file  Social  Connections: Not on file  Intimate Partner Violence: Not on file    Family History  Problem Relation Age of Onset  . Coronary artery disease Mother   . Heart disease Mother        After age 53-Carotid  . Hyperlipidemia Mother   . Stroke Mother   . Coronary artery disease Father   . Hyperlipidemia Father   . Hypertension Father   . Varicose Veins Father   . Heart attack Father   . Coronary artery disease Sister   . Heart disease Sister        After age 59- BP  . Heart attack Sister   . Diabetes Brother   . Heart disease Brother        After 52 yrs of age  . Heart attack Brother   . Hyperlipidemia Brother   . Alzheimer's disease Sister   . Seizures Sister     Current Outpatient Medications  Medication Sig Dispense Refill  . albuterol (VENTOLIN HFA) 108 (90 Base) MCG/ACT inhaler SMARTSIG:2 Puff(s) By Mouth Every 4 Hours PRN    . atorvastatin (LIPITOR) 80 MG tablet TAKE 1 TABLET DAILY (PLEASE MAKE OVERDUE APPOINTMENT BEFORE ANYMORE REFILLS [phone]) 90 tablet 3  . diltiazem (CARDIZEM CD) 120 MG 24 hr capsule TAKE 1 CAPSULE DAILY 90 capsule 2  . ELIQUIS 5 MG TABS tablet TAKE 1 TABLET TWICE A DAY 180 tablet 1  . metFORMIN (GLUCOPHAGE) 500 MG tablet TAKE 1 TABLET TWICE A DAY 180 tablet 2  . Methylfol-Methylcob-Acetylcyst (L-METHYL-MC NAC) 6-2-600 MG TABS TAKE 1 TABLET BY MOUTH EVERY MORNING 90 tablet 3  . metoprolol tartrate (LOPRESSOR) 25 MG tablet TAKE 1 TABLET DAILY 90 tablet 3   No current facility-administered medications for this visit.    No Known Allergies   REVIEW OF SYSTEMS:  [X]  denotes positive finding, [ ]  denotes negative finding Cardiac  Comments:  Chest pain or chest pressure:    Shortness of breath upon exertion:    Short of breath when lying flat:    Irregular heart rhythm:        Vascular    Pain in calf, thigh, or hip brought on by ambulation:    Pain in feet at night that wakes you up from your sleep:     Blood clot in your veins:    Leg  swelling:         Pulmonary    Oxygen at home:    Productive cough:     Wheezing:         Neurologic    Sudden weakness in arms or legs:     Sudden numbness  in arms or legs:     Sudden onset of difficulty speaking or slurred speech:    Temporary loss of vision in one eye:     Problems with dizziness:         Gastrointestinal    Blood in stool:     Vomited blood:         Genitourinary    Burning when urinating:     Blood in urine:        Psychiatric    Major depression:         Hematologic    Bleeding problems:    Problems with blood clotting too easily:        Skin    Rashes or ulcers:        Constitutional    Fever or chills:      PHYSICAL EXAMINATION:  Vitals:   07/31/20 0926  BP: 110/64  Pulse: 80  Resp: 20  Temp: 98.5 F (36.9 C)  TempSrc: Temporal  SpO2: 96%  Weight: 176 lb 8 oz (80.1 kg)  Height: 5\' 6"  (1.676 m)    General:  WDWN in NAD; vital signs documented above Gait: Normal HENT: WNL, normocephalic Pulmonary: normal non-labored breathing , without wheezing Cardiac: regular HR, without  Murmurs without carotid bruit Abdomen: distended, soft, NT, no masses. Normal bowel sounds Vascular Exam/Pulses:  Right Left  Radial 2+ (normal) 2+ (normal)  Femoral 2+ (normal) 2+ (normal)  Popliteal Not palpable Not palpable  DP Not palpable Not palpable  PT 2+ (normal) 2+ (normal)   Extremities: without ischemic changes, without Gangrene , without cellulitis; without open wounds;  Musculoskeletal: no muscle wasting or atrophy  Neurologic: A&O X 3;  No focal weakness or paresthesias are detected Psychiatric:  The pt has Normal affect.   Non-Invasive Vascular Imaging:   07/31/20   Endovascular Aortic Repair (EVAR):  +----------+----------------+-------------------+-------------------+       Diameter AP (cm)Diameter Trans (cm)Velocities (cm/sec)  +----------+----------------+-------------------+-------------------+  Aorta   3.30       3.40        78           +----------+----------------+-------------------+-------------------+  Right Limb1.24      1.39        56           +----------+----------------+-------------------+-------------------+  Left Limb 1.26      1.55        54           +----------+----------------+-------------------+-------------------+   Summary:  Abdominal Aorta: Patent endovascular aneurysm repair with no evidence of endoleak. The largest aortic diameter remains essentially unchanged compared to prior exam.   ASSESSMENT/PLAN:: 85 y.o. male here for follow up for EVAR placed by Dr. Kellie Simmering in 2007. He remains asymptomatic. Duplex today shows stable aneurysm sack compared to prior studies. No endoleak. No new neurological symptoms. Remains very active.  - Will need carotid duplex in 3 years with 1-39% stenosis bilaterally on duplex in 2020 - He will follow up in 1 year with EVAR duplex   Karoline Caldwell, PA-C [facility] [phone]  Clinic MD:  Dr. Trula Slade

## 2020-08-28 DIAGNOSIS — J449 Chronic obstructive pulmonary disease, unspecified: Secondary | ICD-10-CM | POA: Diagnosis not present

## 2020-08-28 DIAGNOSIS — E119 Type 2 diabetes mellitus without complications: Secondary | ICD-10-CM | POA: Diagnosis not present

## 2020-08-28 DIAGNOSIS — G8929 Other chronic pain: Secondary | ICD-10-CM | POA: Diagnosis not present

## 2020-08-28 DIAGNOSIS — Z7689 Persons encountering health services in other specified circumstances: Secondary | ICD-10-CM | POA: Diagnosis not present

## 2020-08-28 DIAGNOSIS — M545 Low back pain, unspecified: Secondary | ICD-10-CM | POA: Diagnosis not present

## 2020-09-21 ENCOUNTER — Encounter: Payer: Self-pay | Admitting: Cardiovascular Disease

## 2020-09-21 NOTE — Progress Notes (Signed)
Gabriel Gutierrez Date of Birth  [birth year] [facility] [address]    [place], [place]  [address] [phone]  Fax  [phone]  Problem list: 1. Coronary artery disease-status post CABG 2. Abdominal aortic aneurysm-Status post stent grafting 3. Diabetes mellitus 4. Dyslipidemia 5. Hypertension 6. Atrial fibrillation -  We have tried cardioversion in the past but it lasted only 1 week.  7. History of stroke   Previous Notes:  Gabriel Gutierrez is a 85 yo gentleman with a history of coronary artery disease-status post coronary artery bypass grafting in 2007. He has a history of hypertension.   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    He was recently admitted to the hospital with an acute stroke. He was also found to have rapid atrial fibrillation. He stroke symptoms resolve fairly quickly and he does not have much of the deficit. He remains in atrial fibrillation.  He's been maintained on Xarelto.  He's completely asymptomatic from a cardiac standpoint. He cannot tell that his heart rate is beating irregularly.  [birth year]. [birth year], 2013 He had a cardioversion performed last month by Dr. Irish Lack who was kind enough to perform the cardioversion after I got tied up in the office. He is completely asymptomatic. He cannot tell whether his heart is in atrial fibrillation or normal sinus rhythm. He has been able to do all of his normal activities without difficulty.  August 10, 2012:  Gabriel Gutierrez is doing well.  His potassium levels have been running high. He had blood checked at his medical doctors office in February and his potassium level was 5.8. He discontinued the benazepril / HCT. Recheck potassium was even higher. He had it checked again this week with results are pending.  He is cutting out all high potassium foods.   He had a cardioversion in   Dec. 11,  2014:  Gabriel Gutierrez is doing well.  Active , works hard every day, no CP.  No dyspnea.  Sleeping well at night.  Completely asymptomatic from an a-fib standpoint.   October 27, 2013: Gabriel Gutierrez is doing ok.    Not able to do as much as he would like.  Still very active - plays golf,  Walking some .  Has some knee problems that limits him.    Dec. 7, 2015: Gabriel Gutierrez is seen today for follow up of his CAD, AAA repair, atrial fib., and dyslipidemia.  He also has a hx of DM and a CVA.   November 08, 2014:  Doing well from a cardiac standpoint. Trouble with knees.  Has had a stroke - on xarelto .  Had an occular stroke  Jan. 23. 2017 Gabriel Gutierrez is doing well from a cardiac standpoint. Needs partial replacement of the left knee Doing great.  No CP,  Has some DOE with significant exertion. No DOE doing regular chores  Jan. 29, 2018: No cardiac issues over the past year.  Has some left temple pain .  BP and HR are normal  July 18, 2017 Seen with granddaughter, Gabriel Gutierrez (cardiac nurse [facility] )  Staying active.  Is slowing down.   Just getting over pneumonia.  Gets lots of yard work.   Plays golf regularly  No CP or dypsena ( was short of breath with pneumonia )   July 27, 2018:  Gabriel Gutierrez is seen with  wife, Gabriel Gutierrez today  Has some fatigue,  No CP , mild dyspena  has had some nose bleeds occurs after blowing his nose  He saw a nephrologist in January.  His creatinine was 1.15 at that time.  Hemoglobin was 13.9.  LDL was 64.  HDL is 45.  Total cholesterol is 136.  Triglycerides are 104.  Sep 24, 2019  Gabriel Gutierrez is seen today for follow up of his CAD, atrial fib,.  He has a hx of AAA -s/p aortobiiliac Cook Zenith stent graft placed by Dr. Kellie Simmering in 2007 for abdominal aortic aneurysm. Active.  Played golf yesterday .   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 2021:   CHol = 116 HDL = 43 LDL = 70 Trig = 107   No CP, not limited by dyspnea   Sep 22, 2020: Seen with son  Gabriel Gutierrez.  Gabriel Gutierrez is seen for follow up of his CAD, atrial fib, AAA - s/p aorto-iliac stenting  by Dr. Kellie Simmering  ,  Follows up with [facility]  Has persistent atrial fib     Current Outpatient Medications on File Prior to Visit  Medication Sig Dispense Refill  . albuterol (VENTOLIN HFA) 108 (90 Base) MCG/ACT inhaler SMARTSIG:2 Puff(s) By Mouth Every 4 Hours PRN    . atorvastatin (LIPITOR) 80 MG tablet TAKE 1 TABLET DAILY (PLEASE MAKE OVERDUE APPOINTMENT BEFORE ANYMORE REFILLS [phone]) 90 tablet 3  . diltiazem (CARDIZEM CD) 120 MG 24 hr capsule TAKE 1 CAPSULE DAILY 90 capsule 2  . ELIQUIS 5 MG TABS tablet TAKE 1 TABLET TWICE A DAY 180 tablet 1  . Methylfol-Methylcob-Acetylcyst (L-METHYL-MC NAC) 6-2-600 MG TABS TAKE 1 TABLET BY MOUTH EVERY MORNING 90 tablet 3  . metoprolol tartrate (LOPRESSOR) 25 MG tablet TAKE 1 TABLET DAILY 90 tablet 3   No current facility-administered medications on file prior to visit.    No Known Allergies  Past Medical History:  Diagnosis Date  . AAA (abdominal aortic aneurysm) ([place])   . Arthritis   . Atrial fibrillation ([place])   . COPD (chronic obstructive pulmonary disease) ([place])   . Coronary artery disease    post CABG x5 --Severe three-vessel coronary disease  . Dysrhythmia   . Hyperlipidemia   . Hypertension   . Irregular heart beat  Feb 18, 2012  . Renal artery stenosis ([place])   . Stroke [facility]) Oct. 1, 2013   Mini stroke- Afib    Past Surgical History:  Procedure Laterality Date  . cabgx5    . CARDIAC CATHETERIZATION  08/26/2005   Est. EF of 65-70% -- Severe three-vessel coronary artery disease -- very heavily calcified vessels and really there is no role for angioplasty  -- he heavily calcified vessels and really there is no role for angioplasty abdominal aortic aneurysm surgery  . CARDIOVERSION  04/15/2012   Procedure: CARDIOVERSION;  Surgeon: Thayer Headings, MD;  Location: [facility];  Service: Cardiovascular;  Laterality: N/A;  . CATARACT EXTRACTION      left eye  . COLON SURGERY     ruptured colon  . CORONARY ARTERY BYPASS GRAFT  09/02/2005   x 5 -- left internal mammary artery graft to the left anterior descending coronary artery, with a saphenous vein graft to the diagonal branch of the LAD, a sequential saphenous vein graft to the second and fourth obtuse marginal branches of the left circumflex coronary artery, and a saphenous vein graft to the posterior descending branch of RCA  --  Endoscopic vein harvesting from the left leg  . EYE SURGERY Left    Cataract  . EYE SURGERY Right    Cataract  . HAND SURGERY  1997  . LAPAROTOMY  1998  . MEDIAN STERNOTOMY    . PARTIAL KNEE ARTHROPLASTY Left 07/17/2015   Procedure: UNICOMPARTMENTAL KNEE;  Surgeon: Vickey Huger, MD;  Location: [facility];  Service: Orthopedics;  Laterality: Left;  . PR VEIN BYPASS GRAFT,AORTO-FEM-POP  12/23/2005   Infrarenal abdominal aortic aneurysm, right common iliac occlusive disease --   . SKIN GRAFT  1992    Social History   Tobacco Use  Smoking Status Former Smoker  . Packs/day: 2.00  . Years: 44.00  . Pack years: 88.00  . Types: Cigarettes  . Quit date: 05/20/1988  . Years since quitting: 32.3  Smokeless Tobacco Former Systems developer  . Quit date: 02/11/2003    Social History   Substance and Sexual Activity  Alcohol Use No  . Alcohol/week: 0.0 standard drinks    Family History  Problem Relation Age of Onset  . Coronary artery disease Mother   . Heart disease Mother        After age 58-Carotid  . Hyperlipidemia Mother   . Stroke Mother   . Coronary artery disease Father   . Hyperlipidemia Father   . Hypertension Father   . Varicose Veins Father   . Heart attack Father   . Coronary artery disease Sister   . Heart disease Sister        After age 11- BP  . Heart attack Sister   . Diabetes Brother   . Heart disease Brother        After 73 yrs of age  . Heart attack Brother   . Hyperlipidemia Brother   . Alzheimer's disease Sister   . Seizures Sister      Reviw of Systems:  Reviewed in the HPI.  All other systems are negative.  Physical Exam: Blood pressure 118/76, pulse (!) 58, height 5\' 6"  (6.387 m), weight 178 lb (80.7 kg), SpO2 98 %.  GEN:  Well nourished, well developed in no acute distress HEENT: Normal NECK: No JVD; No carotid bruits LYMPHATICS: No lymphadenopathy CARDIAC: irreg. Irreg.  RESPIRATORY:  Clear to auscultation without rales, wheezing or rhonchi  ABDOMEN: Soft, non-tender, non-distended MUSCULOSKELETAL:  No edema; No deformity  SKIN: Warm and dry NEUROLOGIC:  Alert and oriented x 3    ECG:   26, 2022: Atrial fibrillation with a slow ventricular response.  He has T wave inversions in the anterolateral leads.  There are no changes from previous EKG.   Assessment / Plan:   1. Coronary artery disease-status post CABG -  No angina .   Cont current med   2. Abdominal aortic aneurysm-     Followed by [facility] .   3. Diabetes mellitus  4. Dyslipidemia -    Labs from Aug look good.  Repeat labs next year     5. Hypertension -     BP is well controlled.    6. Atrial fibrillation -   HR is well controlled. On eliquis       7. History of stroke- cont eliquis    Mertie Moores, MD  09/22/2020 9:43 AM    [facility] [address],  [place], [place]  [address] Pager [phone] Phone: [phone]; Fax: [phone]

## 2020-09-22 ENCOUNTER — Other Ambulatory Visit: Payer: Self-pay

## 2020-09-22 ENCOUNTER — Ambulatory Visit (INDEPENDENT_AMBULATORY_CARE_PROVIDER_SITE_OTHER): Payer: Medicare Other | Admitting: Cardiovascular Disease

## 2020-09-22 ENCOUNTER — Encounter: Payer: Self-pay | Admitting: Cardiovascular Disease

## 2020-09-22 VITALS — BP 118/76 | HR 58 | Ht 66.0 in | Wt 178.0 lb

## 2020-09-22 DIAGNOSIS — I6523 Occlusion and stenosis of bilateral carotid arteries: Secondary | ICD-10-CM | POA: Diagnosis not present

## 2020-09-22 DIAGNOSIS — I482 Chronic atrial fibrillation, unspecified: Secondary | ICD-10-CM

## 2020-09-22 DIAGNOSIS — I10 Essential (primary) hypertension: Secondary | ICD-10-CM | POA: Diagnosis not present

## 2020-09-22 NOTE — Patient Instructions (Signed)
Medication Instructions:  Your physician recommends that you continue on your current medications as directed. Please refer to the Current Medication list given to you today.  *If you need a refill on your cardiac medications before your next appointment, please call your pharmacy*   Lab Work: Your physician recommends that you return for lab work in: 1 year on the day of or a few days before your office visit with Dr. Acie Fredrickson.  You will need to FAST for this appointment - nothing to eat or drink after midnight the night before except water.  Testing/Procedures: none   Follow-Up: At [facility], you and your health needs are our priority.  As part of our continuing mission to provide you with exceptional heart care, we have created designated Provider Care Teams.  These Care Teams include your primary Cardiologist (physician) and Advanced Practice Providers (APPs -  Physician Assistants and Nurse Practitioners) who all work together to provide you with the care you need, when you need it.  We recommend signing up for the patient portal called "MyChart".  Sign up information is provided on this After Visit Summary.  MyChart is used to connect with patients for Virtual Visits (Telemedicine).  Patients are able to view lab/test results, encounter notes, upcoming appointments, etc.  Non-urgent messages can be sent to your provider as well.   To learn more about what you can do with MyChart, go to [email or web address].    Your next appointment:   1 year(s)  The format for your next appointment:   In Person  Provider:   You may see Mertie Moores, MD or one of the following Advanced Practice Providers on your designated Care Team:    Richardson Dopp, PA-C  [place], [place]

## 2020-10-09 ENCOUNTER — Other Ambulatory Visit: Payer: Self-pay | Admitting: Cardiovascular Disease

## 2020-10-09 NOTE — Telephone Encounter (Signed)
Prescription refill request for Eliquis received.  Indication: afib  Last office visit: [place], 09/22/2020 Scr: 1.23, 05/29/2020 Age: 85 yo  Weight: 80.7 kg   Pt is on the correct dose of Eliquis per dosing criteria, prescription refill sent for Eliquis 5mg  BID.

## 2020-10-18 ENCOUNTER — Other Ambulatory Visit: Payer: Self-pay | Admitting: Cardiovascular Disease

## 2020-10-26 DIAGNOSIS — I4891 Unspecified atrial fibrillation: Secondary | ICD-10-CM | POA: Diagnosis not present

## 2020-10-26 DIAGNOSIS — Z79899 Other long term (current) drug therapy: Secondary | ICD-10-CM | POA: Diagnosis not present

## 2020-10-26 DIAGNOSIS — N183 Chronic kidney disease, stage 3 unspecified: Secondary | ICD-10-CM | POA: Diagnosis not present

## 2020-10-26 DIAGNOSIS — E8770 Fluid overload, unspecified: Secondary | ICD-10-CM | POA: Diagnosis not present

## 2020-10-26 DIAGNOSIS — J449 Chronic obstructive pulmonary disease, unspecified: Secondary | ICD-10-CM | POA: Diagnosis not present

## 2020-10-26 DIAGNOSIS — E1122 Type 2 diabetes mellitus with diabetic chronic kidney disease: Secondary | ICD-10-CM | POA: Diagnosis not present

## 2020-10-26 DIAGNOSIS — I129 Hypertensive chronic kidney disease with stage 1 through stage 4 chronic kidney disease, or unspecified chronic kidney disease: Secondary | ICD-10-CM | POA: Diagnosis not present

## 2020-10-26 DIAGNOSIS — Z7901 Long term (current) use of anticoagulants: Secondary | ICD-10-CM | POA: Diagnosis not present

## 2020-10-26 DIAGNOSIS — Z951 Presence of aortocoronary bypass graft: Secondary | ICD-10-CM | POA: Diagnosis not present

## 2020-10-26 DIAGNOSIS — Z87891 Personal history of nicotine dependence: Secondary | ICD-10-CM | POA: Diagnosis not present

## 2020-10-26 DIAGNOSIS — R0602 Shortness of breath: Secondary | ICD-10-CM | POA: Diagnosis not present

## 2020-10-26 DIAGNOSIS — I2510 Atherosclerotic heart disease of native coronary artery without angina pectoris: Secondary | ICD-10-CM | POA: Diagnosis not present

## 2020-10-26 DIAGNOSIS — Z8673 Personal history of transient ischemic attack (TIA), and cerebral infarction without residual deficits: Secondary | ICD-10-CM | POA: Diagnosis not present

## 2020-10-26 DIAGNOSIS — R5383 Other fatigue: Secondary | ICD-10-CM | POA: Diagnosis not present

## 2020-10-26 DIAGNOSIS — E785 Hyperlipidemia, unspecified: Secondary | ICD-10-CM | POA: Diagnosis not present

## 2020-10-26 DIAGNOSIS — N400 Enlarged prostate without lower urinary tract symptoms: Secondary | ICD-10-CM | POA: Diagnosis not present

## 2020-10-26 DIAGNOSIS — R079 Chest pain, unspecified: Secondary | ICD-10-CM | POA: Diagnosis not present

## 2020-10-30 ENCOUNTER — Other Ambulatory Visit: Payer: Self-pay | Admitting: Legal Medicine

## 2020-11-08 DIAGNOSIS — I4819 Other persistent atrial fibrillation: Secondary | ICD-10-CM | POA: Diagnosis not present

## 2020-11-27 DIAGNOSIS — I4891 Unspecified atrial fibrillation: Secondary | ICD-10-CM | POA: Diagnosis not present

## 2020-11-28 DIAGNOSIS — R4189 Other symptoms and signs involving cognitive functions and awareness: Secondary | ICD-10-CM | POA: Diagnosis not present

## 2020-11-28 DIAGNOSIS — M4316 Spondylolisthesis, lumbar region: Secondary | ICD-10-CM | POA: Diagnosis not present

## 2020-11-28 DIAGNOSIS — G8929 Other chronic pain: Secondary | ICD-10-CM | POA: Diagnosis not present

## 2020-11-28 DIAGNOSIS — R7989 Other specified abnormal findings of blood chemistry: Secondary | ICD-10-CM | POA: Diagnosis not present

## 2020-11-28 DIAGNOSIS — R5383 Other fatigue: Secondary | ICD-10-CM | POA: Diagnosis not present

## 2020-11-28 DIAGNOSIS — M47817 Spondylosis without myelopathy or radiculopathy, lumbosacral region: Secondary | ICD-10-CM | POA: Diagnosis not present

## 2020-11-28 DIAGNOSIS — M545 Low back pain, unspecified: Secondary | ICD-10-CM | POA: Diagnosis not present

## 2020-11-28 DIAGNOSIS — I4819 Other persistent atrial fibrillation: Secondary | ICD-10-CM | POA: Diagnosis not present

## 2020-11-28 DIAGNOSIS — R0602 Shortness of breath: Secondary | ICD-10-CM | POA: Diagnosis not present

## 2020-11-28 DIAGNOSIS — M5459 Other low back pain: Secondary | ICD-10-CM | POA: Diagnosis not present

## 2020-11-28 DIAGNOSIS — M47816 Spondylosis without myelopathy or radiculopathy, lumbar region: Secondary | ICD-10-CM | POA: Diagnosis not present

## 2020-12-01 DIAGNOSIS — I4819 Other persistent atrial fibrillation: Secondary | ICD-10-CM | POA: Diagnosis not present

## 2020-12-21 DIAGNOSIS — N1831 Chronic kidney disease, stage 3a: Secondary | ICD-10-CM | POA: Diagnosis not present

## 2020-12-21 DIAGNOSIS — E782 Mixed hyperlipidemia: Secondary | ICD-10-CM | POA: Diagnosis not present

## 2020-12-21 DIAGNOSIS — I639 Cerebral infarction, unspecified: Secondary | ICD-10-CM | POA: Diagnosis not present

## 2020-12-21 DIAGNOSIS — J449 Chronic obstructive pulmonary disease, unspecified: Secondary | ICD-10-CM | POA: Diagnosis not present

## 2020-12-21 DIAGNOSIS — I2510 Atherosclerotic heart disease of native coronary artery without angina pectoris: Secondary | ICD-10-CM | POA: Diagnosis not present

## 2020-12-21 DIAGNOSIS — I4819 Other persistent atrial fibrillation: Secondary | ICD-10-CM | POA: Diagnosis not present

## 2020-12-21 DIAGNOSIS — I10 Essential (primary) hypertension: Secondary | ICD-10-CM | POA: Diagnosis not present

## 2020-12-21 DIAGNOSIS — R001 Bradycardia, unspecified: Secondary | ICD-10-CM | POA: Diagnosis not present

## 2020-12-21 DIAGNOSIS — R0683 Snoring: Secondary | ICD-10-CM | POA: Diagnosis not present

## 2020-12-21 DIAGNOSIS — Z7901 Long term (current) use of anticoagulants: Secondary | ICD-10-CM | POA: Diagnosis not present

## 2020-12-21 DIAGNOSIS — R0600 Dyspnea, unspecified: Secondary | ICD-10-CM | POA: Diagnosis not present

## 2020-12-21 DIAGNOSIS — E119 Type 2 diabetes mellitus without complications: Secondary | ICD-10-CM | POA: Diagnosis not present

## 2020-12-31 DIAGNOSIS — I4891 Unspecified atrial fibrillation: Secondary | ICD-10-CM | POA: Diagnosis not present

## 2021-01-04 DIAGNOSIS — R079 Chest pain, unspecified: Secondary | ICD-10-CM | POA: Diagnosis not present

## 2021-01-04 DIAGNOSIS — I2510 Atherosclerotic heart disease of native coronary artery without angina pectoris: Secondary | ICD-10-CM | POA: Diagnosis not present

## 2021-01-05 DIAGNOSIS — I4891 Unspecified atrial fibrillation: Secondary | ICD-10-CM | POA: Diagnosis not present

## 2021-01-09 DIAGNOSIS — I4891 Unspecified atrial fibrillation: Secondary | ICD-10-CM | POA: Diagnosis not present

## 2021-01-10 DIAGNOSIS — G8929 Other chronic pain: Secondary | ICD-10-CM | POA: Diagnosis not present

## 2021-01-10 DIAGNOSIS — M5136 Other intervertebral disc degeneration, lumbar region: Secondary | ICD-10-CM | POA: Diagnosis not present

## 2021-01-10 DIAGNOSIS — M47819 Spondylosis without myelopathy or radiculopathy, site unspecified: Secondary | ICD-10-CM | POA: Diagnosis not present

## 2021-01-10 DIAGNOSIS — M545 Low back pain, unspecified: Secondary | ICD-10-CM | POA: Diagnosis not present

## 2021-01-10 DIAGNOSIS — M47816 Spondylosis without myelopathy or radiculopathy, lumbar region: Secondary | ICD-10-CM | POA: Diagnosis not present

## 2021-01-10 DIAGNOSIS — M9963 Osseous and subluxation stenosis of intervertebral foramina of lumbar region: Secondary | ICD-10-CM | POA: Diagnosis not present

## 2021-01-25 DIAGNOSIS — M4807 Spinal stenosis, lumbosacral region: Secondary | ICD-10-CM | POA: Diagnosis not present

## 2021-01-25 DIAGNOSIS — M48061 Spinal stenosis, lumbar region without neurogenic claudication: Secondary | ICD-10-CM | POA: Diagnosis not present

## 2021-01-25 DIAGNOSIS — M545 Low back pain, unspecified: Secondary | ICD-10-CM | POA: Diagnosis not present

## 2021-01-25 DIAGNOSIS — M5136 Other intervertebral disc degeneration, lumbar region: Secondary | ICD-10-CM | POA: Diagnosis not present

## 2021-01-25 DIAGNOSIS — G8929 Other chronic pain: Secondary | ICD-10-CM | POA: Diagnosis not present

## 2021-01-25 DIAGNOSIS — M47816 Spondylosis without myelopathy or radiculopathy, lumbar region: Secondary | ICD-10-CM | POA: Diagnosis not present

## 2021-01-25 DIAGNOSIS — M47819 Spondylosis without myelopathy or radiculopathy, site unspecified: Secondary | ICD-10-CM | POA: Diagnosis not present

## 2021-02-06 DIAGNOSIS — R4182 Altered mental status, unspecified: Secondary | ICD-10-CM | POA: Diagnosis not present

## 2021-02-06 DIAGNOSIS — Z20822 Contact with and (suspected) exposure to covid-19: Secondary | ICD-10-CM | POA: Diagnosis not present

## 2021-02-06 DIAGNOSIS — Z8673 Personal history of transient ischemic attack (TIA), and cerebral infarction without residual deficits: Secondary | ICD-10-CM | POA: Diagnosis not present

## 2021-02-06 DIAGNOSIS — R4189 Other symptoms and signs involving cognitive functions and awareness: Secondary | ICD-10-CM | POA: Diagnosis not present

## 2021-02-06 DIAGNOSIS — I4819 Other persistent atrial fibrillation: Secondary | ICD-10-CM | POA: Diagnosis not present

## 2021-02-06 DIAGNOSIS — Z87891 Personal history of nicotine dependence: Secondary | ICD-10-CM | POA: Diagnosis not present

## 2021-02-06 DIAGNOSIS — Z7901 Long term (current) use of anticoagulants: Secondary | ICD-10-CM | POA: Diagnosis not present

## 2021-02-06 DIAGNOSIS — I2510 Atherosclerotic heart disease of native coronary artery without angina pectoris: Secondary | ICD-10-CM | POA: Diagnosis not present

## 2021-02-06 DIAGNOSIS — E785 Hyperlipidemia, unspecified: Secondary | ICD-10-CM | POA: Diagnosis not present

## 2021-02-06 DIAGNOSIS — J449 Chronic obstructive pulmonary disease, unspecified: Secondary | ICD-10-CM | POA: Diagnosis not present

## 2021-02-06 DIAGNOSIS — R5383 Other fatigue: Secondary | ICD-10-CM | POA: Diagnosis not present

## 2021-02-06 DIAGNOSIS — N183 Chronic kidney disease, stage 3 unspecified: Secondary | ICD-10-CM | POA: Diagnosis not present

## 2021-02-06 DIAGNOSIS — Z79899 Other long term (current) drug therapy: Secondary | ICD-10-CM | POA: Diagnosis not present

## 2021-02-06 DIAGNOSIS — I129 Hypertensive chronic kidney disease with stage 1 through stage 4 chronic kidney disease, or unspecified chronic kidney disease: Secondary | ICD-10-CM | POA: Diagnosis not present

## 2021-02-06 DIAGNOSIS — Z96659 Presence of unspecified artificial knee joint: Secondary | ICD-10-CM | POA: Diagnosis not present

## 2021-02-06 DIAGNOSIS — Z791 Long term (current) use of non-steroidal anti-inflammatories (NSAID): Secondary | ICD-10-CM | POA: Diagnosis not present

## 2021-02-06 DIAGNOSIS — N400 Enlarged prostate without lower urinary tract symptoms: Secondary | ICD-10-CM | POA: Diagnosis not present

## 2021-02-06 DIAGNOSIS — D72829 Elevated white blood cell count, unspecified: Secondary | ICD-10-CM | POA: Diagnosis not present

## 2021-02-06 DIAGNOSIS — R4181 Age-related cognitive decline: Secondary | ICD-10-CM | POA: Diagnosis not present

## 2021-02-06 DIAGNOSIS — M1990 Unspecified osteoarthritis, unspecified site: Secondary | ICD-10-CM | POA: Diagnosis not present

## 2021-02-06 DIAGNOSIS — E1122 Type 2 diabetes mellitus with diabetic chronic kidney disease: Secondary | ICD-10-CM | POA: Diagnosis not present

## 2021-02-07 DIAGNOSIS — R4182 Altered mental status, unspecified: Secondary | ICD-10-CM | POA: Diagnosis not present

## 2021-02-14 DIAGNOSIS — R413 Other amnesia: Secondary | ICD-10-CM | POA: Diagnosis not present

## 2021-02-14 DIAGNOSIS — R5383 Other fatigue: Secondary | ICD-10-CM | POA: Diagnosis not present

## 2021-02-21 DIAGNOSIS — Z95818 Presence of other cardiac implants and grafts: Secondary | ICD-10-CM | POA: Diagnosis not present

## 2021-02-21 DIAGNOSIS — R5383 Other fatigue: Secondary | ICD-10-CM | POA: Diagnosis not present

## 2021-02-21 DIAGNOSIS — J449 Chronic obstructive pulmonary disease, unspecified: Secondary | ICD-10-CM | POA: Diagnosis not present

## 2021-02-21 DIAGNOSIS — E1122 Type 2 diabetes mellitus with diabetic chronic kidney disease: Secondary | ICD-10-CM | POA: Diagnosis not present

## 2021-02-21 DIAGNOSIS — Z7901 Long term (current) use of anticoagulants: Secondary | ICD-10-CM | POA: Diagnosis not present

## 2021-02-21 DIAGNOSIS — I2510 Atherosclerotic heart disease of native coronary artery without angina pectoris: Secondary | ICD-10-CM | POA: Diagnosis not present

## 2021-02-21 DIAGNOSIS — N183 Chronic kidney disease, stage 3 unspecified: Secondary | ICD-10-CM | POA: Diagnosis not present

## 2021-02-21 DIAGNOSIS — Z951 Presence of aortocoronary bypass graft: Secondary | ICD-10-CM | POA: Diagnosis not present

## 2021-02-21 DIAGNOSIS — E785 Hyperlipidemia, unspecified: Secondary | ICD-10-CM | POA: Diagnosis not present

## 2021-02-21 DIAGNOSIS — I129 Hypertensive chronic kidney disease with stage 1 through stage 4 chronic kidney disease, or unspecified chronic kidney disease: Secondary | ICD-10-CM | POA: Diagnosis not present

## 2021-02-21 DIAGNOSIS — I4819 Other persistent atrial fibrillation: Secondary | ICD-10-CM | POA: Diagnosis not present

## 2021-02-21 DIAGNOSIS — I714 Abdominal aortic aneurysm, without rupture, unspecified: Secondary | ICD-10-CM | POA: Diagnosis not present

## 2021-02-21 DIAGNOSIS — G3184 Mild cognitive impairment, so stated: Secondary | ICD-10-CM | POA: Diagnosis not present

## 2021-02-22 ENCOUNTER — Other Ambulatory Visit: Payer: Self-pay | Admitting: Neurology

## 2021-02-23 DIAGNOSIS — I129 Hypertensive chronic kidney disease with stage 1 through stage 4 chronic kidney disease, or unspecified chronic kidney disease: Secondary | ICD-10-CM | POA: Diagnosis not present

## 2021-02-23 DIAGNOSIS — I4819 Other persistent atrial fibrillation: Secondary | ICD-10-CM | POA: Diagnosis not present

## 2021-02-23 DIAGNOSIS — G3184 Mild cognitive impairment, so stated: Secondary | ICD-10-CM | POA: Diagnosis not present

## 2021-02-23 DIAGNOSIS — E1122 Type 2 diabetes mellitus with diabetic chronic kidney disease: Secondary | ICD-10-CM | POA: Diagnosis not present

## 2021-02-23 DIAGNOSIS — N183 Chronic kidney disease, stage 3 unspecified: Secondary | ICD-10-CM | POA: Diagnosis not present

## 2021-02-23 DIAGNOSIS — J449 Chronic obstructive pulmonary disease, unspecified: Secondary | ICD-10-CM | POA: Diagnosis not present

## 2021-02-26 DIAGNOSIS — I129 Hypertensive chronic kidney disease with stage 1 through stage 4 chronic kidney disease, or unspecified chronic kidney disease: Secondary | ICD-10-CM | POA: Diagnosis not present

## 2021-02-26 DIAGNOSIS — J449 Chronic obstructive pulmonary disease, unspecified: Secondary | ICD-10-CM | POA: Diagnosis not present

## 2021-02-26 DIAGNOSIS — E1122 Type 2 diabetes mellitus with diabetic chronic kidney disease: Secondary | ICD-10-CM | POA: Diagnosis not present

## 2021-02-26 DIAGNOSIS — I4819 Other persistent atrial fibrillation: Secondary | ICD-10-CM | POA: Diagnosis not present

## 2021-02-26 DIAGNOSIS — N183 Chronic kidney disease, stage 3 unspecified: Secondary | ICD-10-CM | POA: Diagnosis not present

## 2021-02-26 DIAGNOSIS — G3184 Mild cognitive impairment, so stated: Secondary | ICD-10-CM | POA: Diagnosis not present

## 2021-03-01 DIAGNOSIS — I4819 Other persistent atrial fibrillation: Secondary | ICD-10-CM | POA: Diagnosis not present

## 2021-03-01 DIAGNOSIS — N183 Chronic kidney disease, stage 3 unspecified: Secondary | ICD-10-CM | POA: Diagnosis not present

## 2021-03-01 DIAGNOSIS — I129 Hypertensive chronic kidney disease with stage 1 through stage 4 chronic kidney disease, or unspecified chronic kidney disease: Secondary | ICD-10-CM | POA: Diagnosis not present

## 2021-03-01 DIAGNOSIS — J449 Chronic obstructive pulmonary disease, unspecified: Secondary | ICD-10-CM | POA: Diagnosis not present

## 2021-03-01 DIAGNOSIS — E1122 Type 2 diabetes mellitus with diabetic chronic kidney disease: Secondary | ICD-10-CM | POA: Diagnosis not present

## 2021-03-01 DIAGNOSIS — G3184 Mild cognitive impairment, so stated: Secondary | ICD-10-CM | POA: Diagnosis not present

## 2021-03-06 DIAGNOSIS — J449 Chronic obstructive pulmonary disease, unspecified: Secondary | ICD-10-CM | POA: Diagnosis not present

## 2021-03-06 DIAGNOSIS — N183 Chronic kidney disease, stage 3 unspecified: Secondary | ICD-10-CM | POA: Diagnosis not present

## 2021-03-06 DIAGNOSIS — I4819 Other persistent atrial fibrillation: Secondary | ICD-10-CM | POA: Diagnosis not present

## 2021-03-06 DIAGNOSIS — G3184 Mild cognitive impairment, so stated: Secondary | ICD-10-CM | POA: Diagnosis not present

## 2021-03-06 DIAGNOSIS — E1122 Type 2 diabetes mellitus with diabetic chronic kidney disease: Secondary | ICD-10-CM | POA: Diagnosis not present

## 2021-03-06 DIAGNOSIS — I129 Hypertensive chronic kidney disease with stage 1 through stage 4 chronic kidney disease, or unspecified chronic kidney disease: Secondary | ICD-10-CM | POA: Diagnosis not present

## 2021-03-08 DIAGNOSIS — G3184 Mild cognitive impairment, so stated: Secondary | ICD-10-CM | POA: Diagnosis not present

## 2021-03-08 DIAGNOSIS — E1122 Type 2 diabetes mellitus with diabetic chronic kidney disease: Secondary | ICD-10-CM | POA: Diagnosis not present

## 2021-03-08 DIAGNOSIS — I129 Hypertensive chronic kidney disease with stage 1 through stage 4 chronic kidney disease, or unspecified chronic kidney disease: Secondary | ICD-10-CM | POA: Diagnosis not present

## 2021-03-08 DIAGNOSIS — J449 Chronic obstructive pulmonary disease, unspecified: Secondary | ICD-10-CM | POA: Diagnosis not present

## 2021-03-08 DIAGNOSIS — N183 Chronic kidney disease, stage 3 unspecified: Secondary | ICD-10-CM | POA: Diagnosis not present

## 2021-03-08 DIAGNOSIS — I4819 Other persistent atrial fibrillation: Secondary | ICD-10-CM | POA: Diagnosis not present

## 2021-03-10 DIAGNOSIS — R531 Weakness: Secondary | ICD-10-CM | POA: Diagnosis not present

## 2021-03-10 DIAGNOSIS — R051 Acute cough: Secondary | ICD-10-CM | POA: Diagnosis not present

## 2021-03-10 DIAGNOSIS — N400 Enlarged prostate without lower urinary tract symptoms: Secondary | ICD-10-CM | POA: Diagnosis not present

## 2021-03-10 DIAGNOSIS — Z8673 Personal history of transient ischemic attack (TIA), and cerebral infarction without residual deficits: Secondary | ICD-10-CM | POA: Diagnosis not present

## 2021-03-10 DIAGNOSIS — R410 Disorientation, unspecified: Secondary | ICD-10-CM | POA: Diagnosis not present

## 2021-03-10 DIAGNOSIS — R5383 Other fatigue: Secondary | ICD-10-CM | POA: Diagnosis not present

## 2021-03-10 DIAGNOSIS — I4891 Unspecified atrial fibrillation: Secondary | ICD-10-CM | POA: Diagnosis not present

## 2021-03-10 DIAGNOSIS — E1122 Type 2 diabetes mellitus with diabetic chronic kidney disease: Secondary | ICD-10-CM | POA: Diagnosis not present

## 2021-03-10 DIAGNOSIS — R9089 Other abnormal findings on diagnostic imaging of central nervous system: Secondary | ICD-10-CM | POA: Diagnosis not present

## 2021-03-10 DIAGNOSIS — R509 Fever, unspecified: Secondary | ICD-10-CM | POA: Diagnosis not present

## 2021-03-10 DIAGNOSIS — Z7901 Long term (current) use of anticoagulants: Secondary | ICD-10-CM | POA: Diagnosis not present

## 2021-03-10 DIAGNOSIS — F0390 Unspecified dementia without behavioral disturbance: Secondary | ICD-10-CM | POA: Diagnosis not present

## 2021-03-10 DIAGNOSIS — R4182 Altered mental status, unspecified: Secondary | ICD-10-CM | POA: Diagnosis not present

## 2021-03-10 DIAGNOSIS — I2510 Atherosclerotic heart disease of native coronary artery without angina pectoris: Secondary | ICD-10-CM | POA: Diagnosis not present

## 2021-03-10 DIAGNOSIS — Z79899 Other long term (current) drug therapy: Secondary | ICD-10-CM | POA: Diagnosis not present

## 2021-03-10 DIAGNOSIS — Z951 Presence of aortocoronary bypass graft: Secondary | ICD-10-CM | POA: Diagnosis not present

## 2021-03-10 DIAGNOSIS — N183 Chronic kidney disease, stage 3 unspecified: Secondary | ICD-10-CM | POA: Diagnosis not present

## 2021-03-10 DIAGNOSIS — I129 Hypertensive chronic kidney disease with stage 1 through stage 4 chronic kidney disease, or unspecified chronic kidney disease: Secondary | ICD-10-CM | POA: Diagnosis not present

## 2021-03-10 DIAGNOSIS — E785 Hyperlipidemia, unspecified: Secondary | ICD-10-CM | POA: Diagnosis not present

## 2021-03-10 DIAGNOSIS — Z20822 Contact with and (suspected) exposure to covid-19: Secondary | ICD-10-CM | POA: Diagnosis not present

## 2021-03-10 DIAGNOSIS — Z87891 Personal history of nicotine dependence: Secondary | ICD-10-CM | POA: Diagnosis not present

## 2021-03-10 DIAGNOSIS — J449 Chronic obstructive pulmonary disease, unspecified: Secondary | ICD-10-CM | POA: Diagnosis not present

## 2021-03-10 DIAGNOSIS — R059 Cough, unspecified: Secondary | ICD-10-CM | POA: Diagnosis not present

## 2021-03-13 DIAGNOSIS — J449 Chronic obstructive pulmonary disease, unspecified: Secondary | ICD-10-CM | POA: Diagnosis not present

## 2021-03-13 DIAGNOSIS — I739 Peripheral vascular disease, unspecified: Secondary | ICD-10-CM | POA: Diagnosis not present

## 2021-03-13 DIAGNOSIS — Z7689 Persons encountering health services in other specified circumstances: Secondary | ICD-10-CM | POA: Diagnosis not present

## 2021-03-13 DIAGNOSIS — Z23 Encounter for immunization: Secondary | ICD-10-CM | POA: Diagnosis not present

## 2021-03-14 DIAGNOSIS — M47816 Spondylosis without myelopathy or radiculopathy, lumbar region: Secondary | ICD-10-CM | POA: Diagnosis not present

## 2021-03-14 DIAGNOSIS — M545 Low back pain, unspecified: Secondary | ICD-10-CM | POA: Diagnosis not present

## 2021-03-14 DIAGNOSIS — M5136 Other intervertebral disc degeneration, lumbar region: Secondary | ICD-10-CM | POA: Diagnosis not present

## 2021-03-14 DIAGNOSIS — R4189 Other symptoms and signs involving cognitive functions and awareness: Secondary | ICD-10-CM | POA: Diagnosis not present

## 2021-03-14 DIAGNOSIS — R413 Other amnesia: Secondary | ICD-10-CM | POA: Diagnosis not present

## 2021-03-14 DIAGNOSIS — M47819 Spondylosis without myelopathy or radiculopathy, site unspecified: Secondary | ICD-10-CM | POA: Diagnosis not present

## 2021-03-14 DIAGNOSIS — M5459 Other low back pain: Secondary | ICD-10-CM | POA: Diagnosis not present

## 2021-03-14 DIAGNOSIS — G8929 Other chronic pain: Secondary | ICD-10-CM | POA: Diagnosis not present

## 2021-03-15 ENCOUNTER — Ambulatory Visit: Payer: Medicare Other | Admitting: Adult Health

## 2021-03-16 DIAGNOSIS — J449 Chronic obstructive pulmonary disease, unspecified: Secondary | ICD-10-CM | POA: Diagnosis not present

## 2021-03-16 DIAGNOSIS — I10 Essential (primary) hypertension: Secondary | ICD-10-CM | POA: Diagnosis not present

## 2021-03-19 DIAGNOSIS — E1122 Type 2 diabetes mellitus with diabetic chronic kidney disease: Secondary | ICD-10-CM | POA: Diagnosis not present

## 2021-03-19 DIAGNOSIS — G3184 Mild cognitive impairment, so stated: Secondary | ICD-10-CM | POA: Diagnosis not present

## 2021-03-19 DIAGNOSIS — I129 Hypertensive chronic kidney disease with stage 1 through stage 4 chronic kidney disease, or unspecified chronic kidney disease: Secondary | ICD-10-CM | POA: Diagnosis not present

## 2021-03-19 DIAGNOSIS — I4819 Other persistent atrial fibrillation: Secondary | ICD-10-CM | POA: Diagnosis not present

## 2021-03-19 DIAGNOSIS — N183 Chronic kidney disease, stage 3 unspecified: Secondary | ICD-10-CM | POA: Diagnosis not present

## 2021-03-19 DIAGNOSIS — J449 Chronic obstructive pulmonary disease, unspecified: Secondary | ICD-10-CM | POA: Diagnosis not present

## 2021-03-23 DIAGNOSIS — Z7901 Long term (current) use of anticoagulants: Secondary | ICD-10-CM | POA: Diagnosis not present

## 2021-03-23 DIAGNOSIS — Z951 Presence of aortocoronary bypass graft: Secondary | ICD-10-CM | POA: Diagnosis not present

## 2021-03-23 DIAGNOSIS — R5383 Other fatigue: Secondary | ICD-10-CM | POA: Diagnosis not present

## 2021-03-23 DIAGNOSIS — I4819 Other persistent atrial fibrillation: Secondary | ICD-10-CM | POA: Diagnosis not present

## 2021-03-23 DIAGNOSIS — G3184 Mild cognitive impairment, so stated: Secondary | ICD-10-CM | POA: Diagnosis not present

## 2021-03-23 DIAGNOSIS — E1122 Type 2 diabetes mellitus with diabetic chronic kidney disease: Secondary | ICD-10-CM | POA: Diagnosis not present

## 2021-03-23 DIAGNOSIS — I2510 Atherosclerotic heart disease of native coronary artery without angina pectoris: Secondary | ICD-10-CM | POA: Diagnosis not present

## 2021-03-23 DIAGNOSIS — E785 Hyperlipidemia, unspecified: Secondary | ICD-10-CM | POA: Diagnosis not present

## 2021-03-23 DIAGNOSIS — Z95818 Presence of other cardiac implants and grafts: Secondary | ICD-10-CM | POA: Diagnosis not present

## 2021-03-23 DIAGNOSIS — I714 Abdominal aortic aneurysm, without rupture, unspecified: Secondary | ICD-10-CM | POA: Diagnosis not present

## 2021-03-23 DIAGNOSIS — J449 Chronic obstructive pulmonary disease, unspecified: Secondary | ICD-10-CM | POA: Diagnosis not present

## 2021-03-23 DIAGNOSIS — I129 Hypertensive chronic kidney disease with stage 1 through stage 4 chronic kidney disease, or unspecified chronic kidney disease: Secondary | ICD-10-CM | POA: Diagnosis not present

## 2021-03-23 DIAGNOSIS — N183 Chronic kidney disease, stage 3 unspecified: Secondary | ICD-10-CM | POA: Diagnosis not present

## 2021-03-28 DIAGNOSIS — I129 Hypertensive chronic kidney disease with stage 1 through stage 4 chronic kidney disease, or unspecified chronic kidney disease: Secondary | ICD-10-CM | POA: Diagnosis not present

## 2021-03-28 DIAGNOSIS — J449 Chronic obstructive pulmonary disease, unspecified: Secondary | ICD-10-CM | POA: Diagnosis not present

## 2021-03-28 DIAGNOSIS — N183 Chronic kidney disease, stage 3 unspecified: Secondary | ICD-10-CM | POA: Diagnosis not present

## 2021-03-28 DIAGNOSIS — E1122 Type 2 diabetes mellitus with diabetic chronic kidney disease: Secondary | ICD-10-CM | POA: Diagnosis not present

## 2021-03-28 DIAGNOSIS — G3184 Mild cognitive impairment, so stated: Secondary | ICD-10-CM | POA: Diagnosis not present

## 2021-03-28 DIAGNOSIS — I4819 Other persistent atrial fibrillation: Secondary | ICD-10-CM | POA: Diagnosis not present

## 2021-04-04 DIAGNOSIS — J449 Chronic obstructive pulmonary disease, unspecified: Secondary | ICD-10-CM | POA: Diagnosis not present

## 2021-04-04 DIAGNOSIS — E1122 Type 2 diabetes mellitus with diabetic chronic kidney disease: Secondary | ICD-10-CM | POA: Diagnosis not present

## 2021-04-04 DIAGNOSIS — G3184 Mild cognitive impairment, so stated: Secondary | ICD-10-CM | POA: Diagnosis not present

## 2021-04-04 DIAGNOSIS — I4819 Other persistent atrial fibrillation: Secondary | ICD-10-CM | POA: Diagnosis not present

## 2021-04-04 DIAGNOSIS — I129 Hypertensive chronic kidney disease with stage 1 through stage 4 chronic kidney disease, or unspecified chronic kidney disease: Secondary | ICD-10-CM | POA: Diagnosis not present

## 2021-04-04 DIAGNOSIS — R413 Other amnesia: Secondary | ICD-10-CM | POA: Diagnosis not present

## 2021-04-04 DIAGNOSIS — N183 Chronic kidney disease, stage 3 unspecified: Secondary | ICD-10-CM | POA: Diagnosis not present

## 2021-04-10 DIAGNOSIS — I129 Hypertensive chronic kidney disease with stage 1 through stage 4 chronic kidney disease, or unspecified chronic kidney disease: Secondary | ICD-10-CM | POA: Diagnosis not present

## 2021-04-10 DIAGNOSIS — E1122 Type 2 diabetes mellitus with diabetic chronic kidney disease: Secondary | ICD-10-CM | POA: Diagnosis not present

## 2021-04-10 DIAGNOSIS — J449 Chronic obstructive pulmonary disease, unspecified: Secondary | ICD-10-CM | POA: Diagnosis not present

## 2021-04-10 DIAGNOSIS — G3184 Mild cognitive impairment, so stated: Secondary | ICD-10-CM | POA: Diagnosis not present

## 2021-04-10 DIAGNOSIS — I4819 Other persistent atrial fibrillation: Secondary | ICD-10-CM | POA: Diagnosis not present

## 2021-04-10 DIAGNOSIS — N183 Chronic kidney disease, stage 3 unspecified: Secondary | ICD-10-CM | POA: Diagnosis not present

## 2021-04-18 DIAGNOSIS — I4819 Other persistent atrial fibrillation: Secondary | ICD-10-CM | POA: Diagnosis not present

## 2021-04-18 DIAGNOSIS — E1122 Type 2 diabetes mellitus with diabetic chronic kidney disease: Secondary | ICD-10-CM | POA: Diagnosis not present

## 2021-04-18 DIAGNOSIS — I129 Hypertensive chronic kidney disease with stage 1 through stage 4 chronic kidney disease, or unspecified chronic kidney disease: Secondary | ICD-10-CM | POA: Diagnosis not present

## 2021-04-18 DIAGNOSIS — G3184 Mild cognitive impairment, so stated: Secondary | ICD-10-CM | POA: Diagnosis not present

## 2021-04-18 DIAGNOSIS — J449 Chronic obstructive pulmonary disease, unspecified: Secondary | ICD-10-CM | POA: Diagnosis not present

## 2021-04-18 DIAGNOSIS — N183 Chronic kidney disease, stage 3 unspecified: Secondary | ICD-10-CM | POA: Diagnosis not present

## 2021-04-23 DIAGNOSIS — R413 Other amnesia: Secondary | ICD-10-CM | POA: Diagnosis not present

## 2021-05-07 DIAGNOSIS — G301 Alzheimer's disease with late onset: Secondary | ICD-10-CM | POA: Diagnosis not present

## 2021-05-07 DIAGNOSIS — F02A11 Dementia in other diseases classified elsewhere, mild, with agitation: Secondary | ICD-10-CM | POA: Diagnosis not present

## 2021-05-07 DIAGNOSIS — F01A11 Vascular dementia, mild, with agitation: Secondary | ICD-10-CM | POA: Diagnosis not present

## 2021-05-07 DIAGNOSIS — R41844 Frontal lobe and executive function deficit: Secondary | ICD-10-CM | POA: Diagnosis not present

## 2021-05-07 DIAGNOSIS — R413 Other amnesia: Secondary | ICD-10-CM | POA: Diagnosis not present

## 2021-05-09 DIAGNOSIS — Z20822 Contact with and (suspected) exposure to covid-19: Secondary | ICD-10-CM | POA: Diagnosis not present

## 2021-05-09 DIAGNOSIS — I517 Cardiomegaly: Secondary | ICD-10-CM | POA: Diagnosis not present

## 2021-05-09 DIAGNOSIS — J441 Chronic obstructive pulmonary disease with (acute) exacerbation: Secondary | ICD-10-CM | POA: Diagnosis not present

## 2021-05-09 DIAGNOSIS — R059 Cough, unspecified: Secondary | ICD-10-CM | POA: Diagnosis not present

## 2021-05-09 DIAGNOSIS — Z95828 Presence of other vascular implants and grafts: Secondary | ICD-10-CM | POA: Diagnosis not present

## 2021-05-09 DIAGNOSIS — R519 Headache, unspecified: Secondary | ICD-10-CM | POA: Diagnosis not present

## 2021-08-02 ENCOUNTER — Other Ambulatory Visit: Payer: Self-pay | Admitting: *Deleted

## 2021-08-09 NOTE — Progress Notes (Signed)
?HISTORY AND PHYSICAL  ? ? ? ?CC:  follow up. For EVAR ?Requesting Provider:  Julian Reil, MD ? ?HPI: This is a 86 y.o. male who is here today for follow up for AAA and is s/p EVAR in  2007 by Dr. Kellie Simmering. ? ?Pt was last seen 07/31/2020 and at that time, he had a recent fall and was having some back discomfort but otherwise, no abdominal or other back pain.  He was golfing and ambulating without issues.  He had hx of carotid duplex that revealed 1-39% bilateral ICA stenosis in 2020.  He has hx of afib and on Eliquis.  ? ?The pt returns today for follow up studies and is with his wife.  She tells me that he has advanced dementia and is starting to wander at night.  They live in [place] and would like to transfer care to [place] as it is just easier to access.  She states that he does not have wounds on his feet.  She states they are on a wait list for assisted living at [facility] in [place].  She tells me he has hx of several abdominal surgeries.  She states that he did not have a BM since yesterday and was wondering if it affected the study.  ? ?The pt is on a statin for cholesterol management.    ?The pt is not on an aspirin.    Other AC:  Eliquis ?The pt is on CCB, BB for hypertension.  ?The pt does not have diabetes. ?Tobacco hx:  former ? ? ? ?Past Medical History:  ?Diagnosis Date  ? AAA (abdominal aortic aneurysm) ([place])   ? Arthritis   ? Atrial fibrillation ([place])   ? COPD (chronic obstructive pulmonary disease) ([place])   ? Coronary artery disease   ? post CABG x5 --Severe three-vessel coronary disease  ? Dysrhythmia   ? Hyperlipidemia   ? Hypertension   ? Irregular heart beat  Feb 18, 2012  ? Renal artery stenosis ([place])   ? Stroke [facility]) Oct. 1, 2013  ? Mini stroke- Afib  ? ? ?Past Surgical History:  ?Procedure Laterality Date  ? cabgx5    ? CARDIAC CATHETERIZATION  08/26/2005  ? Est. EF of 65-70% -- Severe three-vessel coronary artery disease -- very heavily calcified vessels and really there is no  role for angioplasty  -- he heavily calcified vessels and really there is no role for angioplasty abdominal aortic aneurysm surgery  ? CARDIOVERSION  04/15/2012  ? Procedure: CARDIOVERSION;  Surgeon: Thayer Headings, MD;  Location: [facility] ENDOSCOPY;  Service: Cardiovascular;  Laterality: N/A;  ? CATARACT EXTRACTION    ? left eye  ? COLON SURGERY    ? ruptured colon  ? CORONARY ARTERY BYPASS GRAFT  09/02/2005  ? x 5 -- left internal mammary artery graft to the left anterior descending coronary artery, with a saphenous vein graft to the diagonal branch of the LAD, a sequential saphenous vein graft to the second and fourth obtuse marginal branches of the left circumflex coronary artery, and a saphenous vein graft to the posterior descending branch of RCA  -- Endoscopic vein harvesting from the left leg  ? EYE SURGERY Left   ? Cataract  ? EYE SURGERY Right   ? Cataract  ? HAND SURGERY  1997  ? LAPAROTOMY  1998  ? MEDIAN STERNOTOMY    ? PARTIAL KNEE ARTHROPLASTY Left 07/17/2015  ? Procedure: UNICOMPARTMENTAL KNEE;  Surgeon: Vickey Huger, MD;  Location: [facility];  Service: Orthopedics;  Laterality: Left;  ? PR VEIN BYPASS GRAFT,AORTO-FEM-POP  12/23/2005  ? Infrarenal abdominal aortic aneurysm, right common iliac occlusive disease --   ? SKIN GRAFT  1992  ? ? ?No Known Allergies ? ?Current Outpatient Medications  ?Medication Sig Dispense Refill  ? albuterol (VENTOLIN HFA) 108 (90 Base) MCG/ACT inhaler SMARTSIG:2 Puff(s) By Mouth Every 4 Hours PRN    ? atorvastatin (LIPITOR) 80 MG tablet TAKE 1 TABLET DAILY 90 tablet 3  ? diltiazem (CARDIZEM CD) 120 MG 24 hr capsule TAKE 1 CAPSULE DAILY 90 capsule 2  ? ELIQUIS 5 MG TABS tablet TAKE 1 TABLET TWICE A DAY 180 tablet 1  ? Methylfol-Methylcob-Acetylcyst (METAFOLBIC PLUS) 6-2-600 MG TABS TAKE 1 TABLET BY MOUTH EVERY MORNING 90 tablet 3  ? metoprolol tartrate (LOPRESSOR) 25 MG tablet TAKE 1 TABLET DAILY 90 tablet 3  ? ?No current facility-administered medications for this visit.   ? ? ?Family History  ?Problem Relation Age of Onset  ? Coronary artery disease Mother   ? Heart disease Mother   ?     After age 34-Carotid  ? Hyperlipidemia Mother   ? Stroke Mother   ? Coronary artery disease Father   ? Hyperlipidemia Father   ? Hypertension Father   ? Varicose Veins Father   ? Heart attack Father   ? Coronary artery disease Sister   ? Heart disease Sister   ?     After age 32- BP  ? Heart attack Sister   ? Diabetes Brother   ? Heart disease Brother   ?     After 52 yrs of age  ? Heart attack Brother   ? Hyperlipidemia Brother   ? Alzheimer's disease Sister   ? Seizures Sister   ? ? ?Social History  ? ?Socioeconomic History  ? Marital status: Married  ?  Spouse name: Not on file  ? Number of children: Not on file  ? Years of education: Not on file  ? Highest education level: Not on file  ?Occupational History  ? Not on file  ?Tobacco Use  ? Smoking status: Former  ?  Packs/day: 2.00  ?  Years: 44.00  ?  Pack years: 88.00  ?  Types: Cigarettes  ?  Quit date: 05/20/1988  ?  Years since quitting: 33.2  ? Smokeless tobacco: Former  ?  Quit date: 02/11/2003  ?Substance and Sexual Activity  ? Alcohol use: No  ?  Alcohol/week: 0.0 standard drinks  ? Drug use: No  ? Sexual activity: Never  ?Other Topics Concern  ? Not on file  ?Social History Narrative  ? Not on file  ? ?Social Determinants of Health  ? ?Financial Resource Strain: Not on file  ?Food Insecurity: Not on file  ?Transportation Needs: Not on file  ?Physical Activity: Not on file  ?Stress: Not on file  ?Social Connections: Not on file  ?Intimate Partner Violence: Not on file  ? ? ? ?REVIEW OF SYSTEMS:  ? ?'[X]'$  denotes positive finding, '[ ]'$  denotes negative finding ?Cardiac  Comments:  ?Chest pain or chest pressure:    ?Shortness of breath upon exertion:    ?Short of breath when lying flat:    ?Irregular heart rhythm:    ?    ?Vascular    ?Pain in calf, thigh, or hip brought on by ambulation:    ?Pain in feet at night that wakes you up from your  sleep:     ?Blood clot in your veins:    ?  Leg swelling:     ?    ?Pulmonary    ?Oxygen at home:    ?Productive cough:     ?Wheezing:     ?    ?Neurologic    ?Sudden weakness in arms or legs:     ?Sudden numbness in arms or legs:     ?Sudden onset of difficulty speaking or slurred speech:    ?Temporary loss of vision in one eye:     ?Problems with dizziness:     ?    ?Gastrointestinal    ?Blood in stool:     ?Vomited blood:     ?    ?Genitourinary    ?Burning when urinating:     ?Blood in urine:    ?    ?Psychiatric    ?Major depression:     ?    ?Hematologic    ?Bleeding problems:    ?Problems with blood clotting too easily:    ?    ?Skin    ?Rashes or ulcers:    ?    ?Constitutional    ?Fever or chills:    ? ? ?PHYSICAL EXAMINATION: ? ?Today's Vitals  ? 08/15/21 0954  ?BP: (!) 145/87  ?Pulse: 75  ?Resp: 18  ?Temp: 97.8 ?F (36.6 ?C)  ?TempSrc: Temporal  ?SpO2: 98%  ?Weight: 167 lb (75.8 kg)  ?Height: '5\' 6"'$  (1.676 m)  ? ?Body mass index is 26.95 kg/m?. ? ? ?General:  WDWN in NAD; vital signs documented above ?Gait: Not observed ?HENT: WNL, normocephalic ?Pulmonary: normal non-labored breathing , without wheezing ?Cardiac: regular HR, without  Murmur; without carotid bruits ?Abdomen: soft, NT, no masses; aortic pulse is not palpable ?Skin: without rashes ?Vascular Exam/Pulses: ?Palpable DP and radial pulses bilaterally; femoral pulses difficult to palpate due to body habitus.  Unable to palpate popliteal pulses ?Extremities: without ischemic changes, without Gangrene , without cellulitis; without open wounds;  ?Musculoskeletal: no muscle wasting or atrophy  ?Neurologic: A&O X 3;  No focal weakness or paresthesias are detected ?Psychiatric:  The pt has Normal affect. ? ? ?Non-Invasive Vascular Imaging:   ?EVAR Arterial duplex on 08/15/2021: ?Abdominal Aorta Findings:  ?+--------+-------+----------+----------+--------+--------+--------+  ?LocationAP (cm)Trans (cm)PSV (cm/s)WaveformThrombusComments   ?+--------+-------+----------+----------+--------+--------+--------+  ?Proximal2.56   2.67      28                                  ?+--------+-------+----------+----------+--------+--------+--------+  ?Endovascular Aortic Repair (EVAR)

## 2021-08-10 ENCOUNTER — Other Ambulatory Visit: Payer: Self-pay | Admitting: Cardiovascular Disease

## 2021-08-10 NOTE — Telephone Encounter (Signed)
Pt is now a pt of Orma Render, NP at [facility]. Called [facility] to have prescription sent to new cardiologist; however, representative stated the patient would need to call and provide new information. Called pt and spoke with his wife. She stated she would call and have prescription sent to new office.   ?

## 2021-08-15 ENCOUNTER — Ambulatory Visit (HOSPITAL_COMMUNITY)
Admission: RE | Admit: 2021-08-15 | Discharge: 2021-08-15 | Disposition: A | Discharge status: Home / Self Care | Payer: Medicare Other | Source: Ambulatory Visit | Attending: Physician Assistant | Admitting: Physician Assistant

## 2021-08-15 ENCOUNTER — Ambulatory Visit (INDEPENDENT_AMBULATORY_CARE_PROVIDER_SITE_OTHER): Payer: Medicare Other | Admitting: Physician Assistant

## 2021-08-15 ENCOUNTER — Encounter: Payer: Self-pay | Admitting: Physician Assistant

## 2021-08-15 ENCOUNTER — Other Ambulatory Visit: Payer: Self-pay

## 2021-08-15 VITALS — BP 145/87 | HR 75 | Temp 97.8°F | Resp 18 | Ht 66.0 in | Wt 167.0 lb

## 2021-08-15 DIAGNOSIS — Z9889 Other specified postprocedural states: Secondary | ICD-10-CM | POA: Diagnosis present

## 2021-10-15 ENCOUNTER — Other Ambulatory Visit: Payer: Self-pay | Admitting: Cardiovascular Disease

## 2022-09-23 ENCOUNTER — Other Ambulatory Visit: Payer: Self-pay | Admitting: *Deleted

## 2022-09-23 DIAGNOSIS — Z9889 Other specified postprocedural states: Secondary | ICD-10-CM

## 2022-10-01 ENCOUNTER — Ambulatory Visit (HOSPITAL_COMMUNITY)
Admission: RE | Admit: 2022-10-01 | Discharge: 2022-10-01 | Disposition: A | Discharge status: Home / Self Care | Payer: Medicare Other | Source: Ambulatory Visit | Attending: Vascular Surgery | Admitting: Vascular Surgery

## 2022-10-01 ENCOUNTER — Ambulatory Visit (INDEPENDENT_AMBULATORY_CARE_PROVIDER_SITE_OTHER): Payer: Medicare Other | Admitting: Physician Assistant

## 2022-10-01 VITALS — BP 132/80 | HR 80 | Temp 97.6°F | Resp 20 | Ht 66.0 in

## 2022-10-01 DIAGNOSIS — Z9889 Other specified postprocedural states: Secondary | ICD-10-CM

## 2022-10-01 DIAGNOSIS — M7989 Other specified soft tissue disorders: Secondary | ICD-10-CM

## 2022-10-01 NOTE — Progress Notes (Signed)
HISTORY AND PHYSICAL     CC:  follow up. For EVAR Requesting Provider:  Jamse Mead, MD  HPI: This is a 87 y.o. male who is here today for follow up for AAA and is s/p EVAR in 2007 by Dr. Hart Rochester.  Pt was last seen 08/15/2021 and at that time, his wife was telling me he had advanced dementia and was starting to wander at night.  They were wanting to transfer care to [place] as it was closer and easier for them.  He had palpable DP pulses bilaterally and his study was unchanged without endoleak.    The pt returns today for follow up studies and here with his son, who is his power of attorney.  His son tells me that the pt is starting to get early Alzheimer's and is not as active as he used to be.  The pt denies any abdominal pain.  He does have some chronic low back pain.  His son states that he does have some swelling in both lower legs and his toes turn purple.    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     The pt is on a statin for cholesterol management.    The pt is not on an aspirin.    Other AC:  Eliquis The pt is on BB for hypertension.  The pt is not on medication for diabetes. Tobacco hx:  former    Past Medical History:  Diagnosis Date   AAA (abdominal aortic aneurysm)    Arthritis    Atrial fibrillation (HCC)    COPD (chronic obstructive pulmonary disease) (HCC)    Coronary artery disease    post CABG x5 --Severe three-vessel coronary disease   Dysrhythmia    Hyperlipidemia    Hypertension    Irregular heart beat  Feb 18, 2012   Renal artery stenosis [facility])    Stroke (HCC) Oct. 1, 2013   Mini stroke- Afib    Past Surgical History:  Procedure Laterality Date   [id number]     CARDIAC CATHETERIZATION  08/26/2005   Est. EF of 65-70% -- Severe three-vessel coronary artery disease -- very heavily calcified vessels and really there is no role for angioplasty  -- he heavily calcified vessels and really there is no role  for angioplasty abdominal aortic aneurysm surgery   CARDIOVERSION  04/15/2012   Procedure: CARDIOVERSION;  Surgeon: Vesta Mixer, MD;  Location: [facility] ENDOSCOPY;  Service: Cardiovascular;  Laterality: N/A;   CATARACT EXTRACTION     left eye   COLON SURGERY     ruptured colon   CORONARY ARTERY BYPASS GRAFT  09/02/2005   x 5 -- left internal mammary artery graft to the left anterior descending coronary artery, with a saphenous vein graft to the diagonal branch of the LAD, a sequential saphenous vein graft to the second and fourth obtuse marginal branches of the left circumflex coronary artery, and a saphenous vein graft to the posterior descending branch of RCA  -- Endoscopic vein harvesting from the left leg   EYE SURGERY Left    Cataract   EYE SURGERY Right    Cataract   HAND SURGERY  1997   LAPAROTOMY  1998   MEDIAN STERNOTOMY     PARTIAL KNEE ARTHROPLASTY Left 07/17/2015   Procedure: UNICOMPARTMENTAL KNEE;  Surgeon: Dannielle Huh, MD;  Location[facility] OR;  Service: Orthopedics;  Laterality: Left;   PR VEIN BYPASS GRAFT,AORTO-FEM-POP  12/23/2005  Infrarenal abdominal aortic aneurysm, right common iliac occlusive disease --    SKIN GRAFT  1992    No Known Allergies  Current Outpatient Medications  Medication Sig Dispense Refill   albuterol (VENTOLIN HFA) 108 (90 Base) MCG/ACT inhaler SMARTSIG:2 Puff(s) By Mouth Every 4 Hours PRN     atorvastatin (LIPITOR) 80 MG tablet Take 1 tablet (80 mg total) by mouth daily. Make an appointment with cardiologist for further refills 30 tablet 0   diltiazem (CARDIZEM CD) 120 MG 24 hr capsule TAKE 1 CAPSULE DAILY 90 capsule 2   ELIQUIS 5 MG TABS tablet TAKE 1 TABLET TWICE A DAY 180 tablet 1   Methylfol-Methylcob-Acetylcyst (METAFOLBIC PLUS) 6-2-600 MG TABS TAKE 1 TABLET BY MOUTH EVERY MORNING 90 tablet 3   metoprolol tartrate (LOPRESSOR) 25 MG tablet TAKE 1 TABLET DAILY 90 tablet 3   No current facility-administered medications for this visit.     Family History  Problem Relation Age of Onset   Coronary artery disease Mother    Heart disease Mother        After age 57-Carotid   Hyperlipidemia Mother    Stroke Mother    Coronary artery disease Father    Hyperlipidemia Father    Hypertension Father    Varicose Veins Father    Heart attack Father    Coronary artery disease Sister    Heart disease Sister        After age 21- BP   Heart attack Sister    Diabetes Brother    Heart disease Brother        After 30 yrs of age   Heart attack Brother    Hyperlipidemia Brother    Alzheimer's disease Sister    Seizures Sister     Social History   Socioeconomic History   Marital status: Married    Spouse name: Not on file   Number of children: Not on file   Years of education: Not on file   Highest education level: Not on file  Occupational History   Not on file  Tobacco Use   Smoking status: Former    Packs/day: 2.00    Years: 44.00    Additional pack years: 0.00    Total pack years: 88.00    Types: Cigarettes    Quit date: 05/20/1988    Years since quitting: 34.3   Smokeless tobacco: Former    Quit date: 02/11/2003  Substance and Sexual Activity   Alcohol use: No    Alcohol/week: 0.0 standard drinks of alcohol   Drug use: No   Sexual activity: Never  Other Topics Concern   Not on file  Social History Narrative   Not on file   Social Determinants of Health   Financial Resource Strain: Not on file  Food Insecurity: Not on file  Transportation Needs: Not on file  Physical Activity: Not on file  Stress: Not on file  Social Connections: Not on file  Intimate Partner Violence: Not on file     REVIEW OF SYSTEMS:   [X]  denotes positive finding, [ ]  denotes negative finding Cardiac  Comments:  Chest pain or chest pressure:    Shortness of breath upon exertion:    Short of breath when lying flat:    Irregular heart rhythm:        Vascular    Pain in calf, thigh, or hip brought on by ambulation:     Pain in feet at night that wakes you up from your sleep:  Blood clot in your veins:    Leg swelling:         Pulmonary    Oxygen at home:    Productive cough:     Wheezing:         Neurologic    Sudden weakness in arms or legs:     Sudden numbness in arms or legs:     Sudden onset of difficulty speaking or slurred speech:    Temporary loss of vision in one eye:     Problems with dizziness:         Gastrointestinal    Blood in stool:     Vomited blood:         Genitourinary    Burning when urinating:     Blood in urine:        Psychiatric    Major depression:         Hematologic    Bleeding problems:    Problems with blood clotting too easily:        Skin    Rashes or ulcers:        Constitutional    Fever or chills:      PHYSICAL EXAMINATION:  Today's Vitals   10/01/22 1017  BP: 132/80  Pulse: 80  Resp: 20  Temp: 97.6 F (36.4 C)  TempSrc: Temporal  SpO2: 99%  Height: 5\' 6"  (1.676 m)  PainSc: 6    Body mass index is 26.95 kg/m.   General:  WDWN in NAD; vital signs documented above Gait: Not observed HENT: WNL, normocephalic Pulmonary: normal non-labored breathing  Cardiac: irregular HR;  without carotid bruits Abdomen: soft, NT; aortic pulse is not palpable Skin: without rashes Vascular Exam/Pulses:  Right Left  Radial 2+ (normal) 2+ (normal)  DP 2+ (normal) 2+ (normal)   Extremities: without open wounds; toe discoloration bilaterally Musculoskeletal: no muscle wasting or atrophy  Neurologic: A&O X 3 Psychiatric:  The pt has Normal affect.   Non-Invasive Vascular Imaging:   EVAR Arterial duplex on 10/01/2022: Endovascular Aortic Repair (EVAR):  +----------+----------------+-------------------+-------------------+           Diameter AP (cm)Diameter Trans (cm)Velocities (cm/sec)  +----------+----------------+-------------------+-------------------+  Aorta    4.55            4.75                                     +----------+----------------+-------------------+-------------------+  Right Limb1.02                               57                   +----------+----------------+-------------------+-------------------+  Left Limb 0.81                               40                   +----------+----------------+-------------------+-------------------+   Summary:  Abdominal Aorta: Technically limited by body habitus and bowel gas. Unable to interrogate with color due to flash artifact. See comparison above.   Previous EVAR arterial duplex on 08/15/2021: 3.47cm x 3.44cm no endoleak   ASSESSMENT/PLAN:: 87 y.o. male here with hx of EVAR in 2007 by Dr. Hart Rochester  -pt doing well without new abdominal or back pain.  Results of ultrasound today reveal increase in size, however, when talking with u/s tech, she states this was an extremely difficult study.  Given the increase in size, which is most likely not reliable, will have him return in 6 months.  If there is any question at that time, we can set him up for CTA a/p to evaluate repair.  Discussed if he develops any sudden severe abdominal or back pain to call 911. -continue statin.  Pt is on Eliquis for afib -discussed with son that given his father had AAA, he and his siblings should be checked for aneurysm. -pt with leg swelling.  He has palpable DP pulses.  He was measured for 15-32mmHg knee high compression.  Discussed with pt and son about elevation a couple times a day and wearing compression.       Doreatha Massed, [facility] [phone]  Clinic MD:   [facility] Spore

## 2022-10-10 ENCOUNTER — Other Ambulatory Visit: Payer: Self-pay

## 2022-10-10 DIAGNOSIS — Z9889 Other specified postprocedural states: Secondary | ICD-10-CM

## 2023-03-25 ENCOUNTER — Other Ambulatory Visit (HOSPITAL_COMMUNITY): Payer: Medicare Other

## 2023-03-25 ENCOUNTER — Ambulatory Visit: Payer: Medicare Other
